# Patient Record
Sex: Male | Born: 1955 | Race: White | Hispanic: No | Marital: Married | Smoking: Never smoker
Health system: Southern US, Community
[De-identification: ages and names within clinical notes are randomized; demographics above are authoritative.]

## PROBLEM LIST (undated history)

## (undated) DIAGNOSIS — G5710 Meralgia paresthetica, unspecified lower limb: Secondary | ICD-10-CM

## (undated) DIAGNOSIS — R7301 Impaired fasting glucose: Secondary | ICD-10-CM

## (undated) DIAGNOSIS — Z95810 Presence of automatic (implantable) cardiac defibrillator: Secondary | ICD-10-CM

## (undated) DIAGNOSIS — T7840XA Allergy, unspecified, initial encounter: Secondary | ICD-10-CM

## (undated) DIAGNOSIS — E785 Hyperlipidemia, unspecified: Secondary | ICD-10-CM

## (undated) DIAGNOSIS — G4730 Sleep apnea, unspecified: Secondary | ICD-10-CM

## (undated) DIAGNOSIS — J45909 Unspecified asthma, uncomplicated: Secondary | ICD-10-CM

## (undated) DIAGNOSIS — I10 Essential (primary) hypertension: Secondary | ICD-10-CM

## (undated) DIAGNOSIS — I509 Heart failure, unspecified: Secondary | ICD-10-CM

## (undated) DIAGNOSIS — E291 Testicular hypofunction: Secondary | ICD-10-CM

## (undated) DIAGNOSIS — R749 Abnormal serum enzyme level, unspecified: Secondary | ICD-10-CM

## (undated) HISTORY — DX: Heart failure, unspecified: I50.9

## (undated) HISTORY — DX: Sleep apnea, unspecified: G47.30

## (undated) HISTORY — DX: Essential (primary) hypertension: I10

## (undated) HISTORY — DX: Hyperlipidemia, unspecified: E78.5

## (undated) HISTORY — DX: Meralgia paresthetica, unspecified lower limb: G57.10

## (undated) HISTORY — PX: NASAL SINUS SURGERY: SHX719

## (undated) HISTORY — DX: Impaired fasting glucose: R73.01

## (undated) HISTORY — DX: Allergy, unspecified, initial encounter: T78.40XA

## (undated) HISTORY — PX: SMALL INTESTINE SURGERY: SHX150

## (undated) HISTORY — DX: Unspecified asthma, uncomplicated: J45.909

## (undated) HISTORY — DX: Testicular hypofunction: E29.1

## (undated) HISTORY — PX: APPENDECTOMY: SHX54

## (undated) HISTORY — DX: Abnormal serum enzyme level, unspecified: R74.9

---

## 2010-10-12 ENCOUNTER — Ambulatory Visit: Payer: Self-pay | Admitting: Gastroenterology

## 2010-10-12 LAB — HM COLONOSCOPY

## 2010-12-12 ENCOUNTER — Ambulatory Visit: Payer: Self-pay

## 2011-10-06 ENCOUNTER — Ambulatory Visit: Payer: Self-pay

## 2013-02-21 ENCOUNTER — Ambulatory Visit: Payer: Self-pay

## 2013-06-08 ENCOUNTER — Emergency Department: Payer: Self-pay | Admitting: Emergency Medicine

## 2013-06-08 LAB — URINALYSIS, COMPLETE
Bilirubin,UR: NEGATIVE
Blood: NEGATIVE
Glucose,UR: NEGATIVE mg/dL (ref 0–75)
Ketone: NEGATIVE
Leukocyte Esterase: NEGATIVE
Nitrite: NEGATIVE
Ph: 7 (ref 4.5–8.0)
WBC UR: <1 /HPF (ref 0–5)

## 2013-06-08 LAB — CBC
HGB: 15.6 g/dL (ref 13.0–18.0)
MCHC: 37 g/dL — ABNORMAL HIGH (ref 32.0–36.0)
MCV: 97 fL (ref 80–100)
RDW: 12.8 % (ref 11.5–14.5)
WBC: 7.4 10*3/uL (ref 3.8–10.6)

## 2013-06-08 LAB — COMPREHENSIVE METABOLIC PANEL
Alkaline Phosphatase: 51 U/L (ref 50–136)
Anion Gap: 5 — ABNORMAL LOW (ref 7–16)
BUN: 16 mg/dL (ref 7–18)
Chloride: 104 mmol/L (ref 98–107)
Creatinine: 1.31 mg/dL — ABNORMAL HIGH (ref 0.60–1.30)
EGFR (African American): >60
EGFR (Non-African Amer.): >60
Glucose: 100 mg/dL — ABNORMAL HIGH (ref 65–99)
Osmolality: 279 (ref 275–301)
SGOT(AST): 35 U/L (ref 15–37)
SGPT (ALT): 44 U/L (ref 12–78)
Sodium: 139 mmol/L (ref 136–145)

## 2013-06-08 LAB — CK TOTAL AND CKMB (NOT AT ARMC): CK-MB: 2.5 ng/mL (ref 0.5–3.6)

## 2014-03-04 DIAGNOSIS — G4733 Obstructive sleep apnea (adult) (pediatric): Secondary | ICD-10-CM | POA: Insufficient documentation

## 2014-03-04 DIAGNOSIS — J45909 Unspecified asthma, uncomplicated: Secondary | ICD-10-CM | POA: Insufficient documentation

## 2014-04-13 LAB — PSA

## 2014-06-15 DIAGNOSIS — J310 Chronic rhinitis: Secondary | ICD-10-CM | POA: Insufficient documentation

## 2014-06-15 DIAGNOSIS — J339 Nasal polyp, unspecified: Secondary | ICD-10-CM | POA: Insufficient documentation

## 2015-01-25 ENCOUNTER — Other Ambulatory Visit: Payer: Self-pay | Admitting: Unknown Physician Specialty

## 2015-04-20 ENCOUNTER — Other Ambulatory Visit: Payer: Self-pay | Admitting: Unknown Physician Specialty

## 2015-04-22 DIAGNOSIS — J709 Respiratory conditions due to unspecified external agent: Secondary | ICD-10-CM | POA: Insufficient documentation

## 2015-05-04 DIAGNOSIS — I110 Hypertensive heart disease with heart failure: Secondary | ICD-10-CM | POA: Insufficient documentation

## 2015-05-04 DIAGNOSIS — E039 Hypothyroidism, unspecified: Secondary | ICD-10-CM

## 2015-05-04 DIAGNOSIS — E291 Testicular hypofunction: Secondary | ICD-10-CM

## 2015-05-04 DIAGNOSIS — E669 Obesity, unspecified: Secondary | ICD-10-CM | POA: Insufficient documentation

## 2015-05-04 DIAGNOSIS — E785 Hyperlipidemia, unspecified: Secondary | ICD-10-CM

## 2015-05-04 DIAGNOSIS — J4489 Other specified chronic obstructive pulmonary disease: Secondary | ICD-10-CM

## 2015-05-04 DIAGNOSIS — R1032 Left lower quadrant pain: Secondary | ICD-10-CM | POA: Insufficient documentation

## 2015-05-04 DIAGNOSIS — J449 Chronic obstructive pulmonary disease, unspecified: Secondary | ICD-10-CM

## 2015-05-04 DIAGNOSIS — G5710 Meralgia paresthetica, unspecified lower limb: Secondary | ICD-10-CM

## 2015-05-04 DIAGNOSIS — R749 Abnormal serum enzyme level, unspecified: Secondary | ICD-10-CM

## 2015-05-04 DIAGNOSIS — R1030 Lower abdominal pain, unspecified: Secondary | ICD-10-CM

## 2015-05-04 DIAGNOSIS — J45909 Unspecified asthma, uncomplicated: Secondary | ICD-10-CM

## 2015-05-04 DIAGNOSIS — I10 Essential (primary) hypertension: Secondary | ICD-10-CM

## 2015-05-04 DIAGNOSIS — R7301 Impaired fasting glucose: Secondary | ICD-10-CM | POA: Insufficient documentation

## 2015-05-05 ENCOUNTER — Encounter: Payer: Self-pay | Admitting: Unknown Physician Specialty

## 2015-05-05 ENCOUNTER — Ambulatory Visit (INDEPENDENT_AMBULATORY_CARE_PROVIDER_SITE_OTHER): Payer: Managed Care, Other (non HMO) | Admitting: Unknown Physician Specialty

## 2015-05-05 VITALS — BP 123/79 | HR 80 | Temp 98.2°F | Ht 69.7 in | Wt 318.2 lb

## 2015-05-05 DIAGNOSIS — I10 Essential (primary) hypertension: Secondary | ICD-10-CM

## 2015-05-05 DIAGNOSIS — E785 Hyperlipidemia, unspecified: Secondary | ICD-10-CM

## 2015-05-05 DIAGNOSIS — R17 Unspecified jaundice: Secondary | ICD-10-CM | POA: Diagnosis not present

## 2015-05-05 LAB — LIPID PANEL PICCOLO, WAIVED
Chol/HDL Ratio Piccolo,Waive: 3 mg/dL
Cholesterol Piccolo, Waived: 195 mg/dL (ref <200)
HDL CHOL PICCOLO, WAIVED: 64 mg/dL (ref >59)
LDL Chol Calc Piccolo Waived: 77 mg/dL (ref <100)
TRIGLYCERIDES PICCOLO,WAIVED: 264 mg/dL — AB (ref <150)
VLDL Chol Calc Piccolo,Waive: 53 mg/dL — ABNORMAL HIGH (ref <30)

## 2015-05-05 LAB — MICROALBUMIN, URINE WAIVED
Creatinine, Urine Waived: 200 mg/dL (ref 10–300)
MICROALB, UR WAIVED: 10 mg/L (ref 0–19)
Microalb/Creat Ratio: <30 mg/g (ref <30)

## 2015-05-05 MED ORDER — VALSARTAN 320 MG PO TABS: 320.0000 mg | ORAL_TABLET | Freq: Every day | ORAL | Status: DC

## 2015-05-05 NOTE — Assessment & Plan Note (Signed)
Mild elevation.  Recheck CMP today

## 2015-05-05 NOTE — Assessment & Plan Note (Signed)
Stable, continue present medications.   

## 2015-05-05 NOTE — Progress Notes (Signed)
BP 123/79 mmHg  Pulse 80  Temp(Src) 98.2 F (36.8 C)  Ht 5' 9.7" (1.77 m)  Wt 318 lb 3.2 oz (144.335 kg)  BMI 46.07 kg/m2  SpO2 91%   Subjective:    Patient ID: Thomas Calhoun, male    DOB: 1956-02-03, 59 y.o.   MRN: [id number]  HPI: Thomas Calhoun is a 59 y.o. male  Chief Complaint  Patient presents with  . Hyperlipidemia  . Hypertension  . Hypothyroidism   Pt was just in the hospital for mold issues.  His 6 month f/u is due now but came in for a total Bilirubin of 1.6.  His other liver enzymes are normal.  He does not drink.  He got labs done for the mold issues but I'm not sure what he got.   Relevant past medical, surgical, family and social history reviewed and updated as indicated. Interim medical history since our last visit reviewed.  I reviewed care everywhere and labs relevant to his chronic diagnosis were not drawn.    Hyperlipidemia This is a chronic problem. The problem is controlled. Associated symptoms include shortness of breath. Pertinent negatives include no chest pain, focal sensory loss, focal weakness, leg pain or myalgias. (Seeing pumonary) There are no compliance problems.   Hypertension This is a chronic problem. Associated symptoms include shortness of breath. Pertinent negatives include no chest pain, malaise/fatigue, neck pain or palpitations. There are no compliance problems.     Allergies and medications reviewed and updated.  Review of Systems  Constitutional: Negative for malaise/fatigue.  Respiratory: Positive for shortness of breath.        Severe asthma per Dr. Raul Del  Cardiovascular: Negative for chest pain and palpitations.  Musculoskeletal: Negative for myalgias and neck pain.  Neurological: Negative for focal weakness.    Per HPI unless specifically indicated above     Objective:    BP 123/79 mmHg  Pulse 80  Temp(Src) 98.2 F (36.8 C)  Ht 5' 9.7" (1.77 m)  Wt 318 lb 3.2 oz (144.335 kg)  BMI 46.07 kg/m2  SpO2 91%  Wt  Readings from Last 3 Encounters:  05/05/15 318 lb 3.2 oz (144.335 kg)  11/03/14 317 lb (143.79 kg)    Physical Exam  Constitutional: He is oriented to person, place, and time. He appears well-developed and well-nourished. No distress.  HENT:  Head: Normocephalic and atraumatic.  Eyes: Conjunctivae and lids are normal. Right eye exhibits no discharge. Left eye exhibits no discharge. No scleral icterus.  Cardiovascular: Normal rate, regular rhythm and normal heart sounds.   Pulmonary/Chest: He has wheezes.  Abdominal: Normal appearance and bowel sounds are normal. He exhibits no distension. There is no splenomegaly or hepatomegaly. There is no tenderness.  Musculoskeletal: Normal range of motion.  Neurological: He is alert and oriented to person, place, and time.  Skin: Skin is intact. No rash noted. No pallor.  Psychiatric: He has a normal mood and affect. His behavior is normal. Judgment and thought content normal.       Assessment & Plan:   Problem List Items Addressed This Visit      Unprioritized   Hypertension - Primary    Stable, continue present medications.       Hyperlipidemia    Stable, continue present medications.       Total bilirubin, elevated    Mild elevation.  Recheck CMP today          Follow up plan: Return in about 6 months (around 11/02/2015)  for physical.

## 2015-05-06 ENCOUNTER — Encounter: Payer: Self-pay | Admitting: Unknown Physician Specialty

## 2015-05-06 LAB — COMPREHENSIVE METABOLIC PANEL
A/G RATIO: 2 (ref 1.1–2.5)
ALT: 23 IU/L (ref 0–44)
AST: 21 IU/L (ref 0–40)
Albumin: 4.3 g/dL (ref 3.5–5.5)
Alkaline Phosphatase: 53 IU/L (ref 39–117)
BILIRUBIN TOTAL: 1.1 mg/dL (ref 0.0–1.2)
BUN/Creatinine Ratio: 11 (ref 9–20)
BUN: 13 mg/dL (ref 6–24)
CHLORIDE: 100 mmol/L (ref 97–108)
CO2: 27 mmol/L (ref 18–29)
Calcium: 9.4 mg/dL (ref 8.7–10.2)
Creatinine, Ser: 1.2 mg/dL (ref 0.76–1.27)
GFR calc Af Amer: 77 mL/min/{1.73_m2} (ref >59)
GFR calc non Af Amer: 66 mL/min/{1.73_m2} (ref >59)
GLUCOSE: 88 mg/dL (ref 65–99)
Globulin, Total: 2.2 g/dL (ref 1.5–4.5)
POTASSIUM: 3.7 mmol/L (ref 3.5–5.2)
Sodium: 144 mmol/L (ref 134–144)
Total Protein: 6.5 g/dL (ref 6.0–8.5)

## 2015-05-06 LAB — URIC ACID: URIC ACID: 6.2 mg/dL (ref 3.7–8.6)

## 2015-05-12 ENCOUNTER — Telehealth: Payer: Self-pay | Admitting: Unknown Physician Specialty

## 2015-05-12 NOTE — Telephone Encounter (Signed)
Pt called would like his lab results from last week. Please call him ASAP. Thanks.

## 2015-05-12 NOTE — Telephone Encounter (Signed)
Called and let patient know that lab results were sent out in the mail and he should be getting them soon. I told him to give [place] a call if he has any questions or concerns when he looks over his results.

## 2015-06-16 ENCOUNTER — Other Ambulatory Visit: Payer: Self-pay

## 2015-06-16 MED ORDER — FLUTICASONE PROPIONATE 50 MCG/ACT NA SUSP: 2.0000 | Freq: Every day | NASAL | Status: DC

## 2015-06-16 NOTE — Telephone Encounter (Signed)
LAST VISIT: 05/05/2015 UPCOMING APT: 10/29/2014  Request for fluticasone 79mcg nasal spray.

## 2015-07-19 ENCOUNTER — Other Ambulatory Visit: Payer: Self-pay | Admitting: Unknown Physician Specialty

## 2015-07-19 MED ORDER — VALSARTAN 320 MG PO TABS: 320.0000 mg | ORAL_TABLET | Freq: Every day | ORAL | Status: DC

## 2015-07-19 NOTE — Telephone Encounter (Signed)
Patient was seen 05/05/15 and has an appointment scheduled for 11/02/15.

## 2015-07-19 NOTE — Telephone Encounter (Signed)
Pt needs check further refills 

## 2015-08-12 ENCOUNTER — Other Ambulatory Visit: Payer: Self-pay

## 2015-08-12 MED ORDER — FLUTICASONE PROPIONATE 50 MCG/ACT NA SUSP: 2.0000 | Freq: Every day | NASAL | Status: DC

## 2015-08-12 NOTE — Telephone Encounter (Signed)
DOB: March 02, 1956 Last Visit: 05/12/2015 Next Appointment: 11/02/2015 [facility] Rx #: [id number]  Request for Fluticasone 60mcg/act # 16.

## 2015-11-02 ENCOUNTER — Encounter: Payer: Self-pay | Admitting: Unknown Physician Specialty

## 2015-11-02 ENCOUNTER — Ambulatory Visit (INDEPENDENT_AMBULATORY_CARE_PROVIDER_SITE_OTHER): Payer: Managed Care, Other (non HMO) | Admitting: Unknown Physician Specialty

## 2015-11-02 VITALS — BP 146/81 | HR 80 | Temp 98.4°F | Ht 68.2 in | Wt 329.0 lb

## 2015-11-02 DIAGNOSIS — I10 Essential (primary) hypertension: Secondary | ICD-10-CM

## 2015-11-02 DIAGNOSIS — E785 Hyperlipidemia, unspecified: Secondary | ICD-10-CM

## 2015-11-02 DIAGNOSIS — R7301 Impaired fasting glucose: Secondary | ICD-10-CM

## 2015-11-02 MED ORDER — VALSARTAN 320 MG PO TABS: 320.0000 mg | ORAL_TABLET | Freq: Every day | ORAL | Status: DC

## 2015-11-02 NOTE — Assessment & Plan Note (Addendum)
Pt working to control HLD through diet and exercise. Collected Lipid panel (sent out d/t [facility] closed). Pt will follow-up in 6 months.

## 2015-11-02 NOTE — Assessment & Plan Note (Addendum)
Pt taking medication without difficulty. In clinic BP 146/81. Collected CMP, Uric Acid, and Microalbumin labs (sent out d/t [facility] closed). Pt given refill for Valsartan and will follow-up in 6 months.

## 2015-11-02 NOTE — Progress Notes (Signed)
BP 146/81 mmHg  Pulse 80  Temp(Src) 98.4 F (36.9 C)  Ht 5' 8.2" (1.732 m)  Wt 329 lb (149.233 kg)  BMI 49.75 kg/m2  SpO2 92%   Subjective:    Patient ID: Thomas Calhoun, male    DOB: Mar 14, 1956, 60 y.o.   MRN: [id number]  HPI: Thomas Calhoun is a 60 y.o. male  Chief Complaint  Patient presents with  . Hyperlipidemia  . Hypertension    Pt c/o congestion and URI. Pt was seen by pulmonologist and given a prednisone taper and z-pack. Pt reports improvement in symptoms. Pt is taking medication as prescribed without difficulty. Denies episodes of CP, SOB, dizziness, or weakness.   Hypertension Using medications without difficulty Average home BP's: pt does not check BP at home.   No problems or lightheadedness No chest pain with exertion  Exertional shortness of breath: pt reports increased SOB over the last few weeks d/t URI. Pt states that on a normal basis he does not have SOB.  Edema: pt reports peripheral edema in bilateral ankles. Pt thinks it may be related to his prednisone use, on/off, for his respiratory disease.   Hyperlipidemia Diet compliance: pt reports reduced intake in sugar, diet drinks, and increased water intake Exercise: pt reports exercising, reduced over the last few weeks d/t his breathing difficulty and respiratory illness. Prior to recent illness, pt was going to the local gym everyday for at least 1 hour of physical activity.   Relevant past medical, surgical, family and social history reviewed and updated as indicated. Interim medical history since our last visit reviewed. Allergies and medications reviewed and updated.  Review of Systems /81 Constitutional: Positive for activity change (recent change d/t increased SOB related to URI). Negative for fever, chills, appetite change, fatigue and unexpected weight change.  HENT: Negative.   Eyes: Negative.   Respiratory: Positive for cough, shortness of breath and wheezing. Negative for chest  tightness.   Cardiovascular: Positive for leg swelling. Negative for chest pain and palpitations.  Gastrointestinal: Negative.   Genitourinary: Negative.   Musculoskeletal: Negative.   Skin: Negative.   Neurological: Negative.  Negative for syncope, weakness and light-headedness.  Psychiatric/Behavioral: Negative.     Per HPI unless specifically indicated above     Objective:    BP 146/81 mmHg  Pulse 80  Temp(Src) 98.4 F (36.9 C)  Ht 5' 8.2" (1.732 m)  Wt 329 lb (149.233 kg)  BMI 49.75 kg/m2  SpO2 92%  Wt Readings from Last 3 Encounters:  11/02/15 329 lb (149.233 kg)  05/05/15 318 lb 3.2 oz (144.335 kg)  11/03/14 317 lb (143.79 kg)    Physical Exam  Constitutional: He is oriented to person, place, and time. He appears well-developed and well-nourished. No distress.  HENT:  Head: Normocephalic and atraumatic.  Eyes: Conjunctivae and EOM are normal. Pupils are equal, round, and reactive to light.  Neck: Normal range of motion. Neck supple. No JVD present. Carotid bruit is not present.  Cardiovascular: Normal rate, regular rhythm, normal heart sounds and normal pulses.   Pulmonary/Chest: Effort normal. No accessory muscle usage. No respiratory distress. He has wheezes. He exhibits no tenderness.  Abdominal: Soft. Bowel sounds are normal. There is no tenderness.  Musculoskeletal: Normal range of motion. He exhibits edema (noted bilateral lower extremity edema ). He exhibits no tenderness.  Neurological: He is alert and oriented to person, place, and time.  Skin: Skin is warm and dry.  Psychiatric: He has a normal mood and  affect. His behavior is normal. Judgment and thought content normal.  Nursing note and vitals reviewed.   Results for orders placed or performed in visit on 05/05/15  Lipid Panel Piccolo, [employer]  Result Value Ref Range   Cholesterol Piccolo, Waived 195 <200 mg/dL   HDL Chol Piccolo, Waived 64 >59 mg/dL   Triglycerides Piccolo,Waived 264 (H) <150 mg/dL    Chol/HDL Ratio Piccolo,Waive 3.0 mg/dL   LDL Chol Calc Piccolo Waived 77 <100 mg/dL   VLDL Chol Calc Piccolo,Waive 53 (H) <30 mg/dL  Uric acid  Result Value Ref Range   Uric Acid 6.2 3.7 - 8.6 mg/dL  Microalbumin, Urine Waived  Result Value Ref Range   Microalb, Ur Waived 10 0 - 19 mg/L   Creatinine, Urine Waived 200 10 - 300 mg/dL   Microalb/Creat Ratio <30 <30 mg/g  Comprehensive metabolic panel  Result Value Ref Range   Glucose 88 65 - 99 mg/dL   BUN 13 6 - 24 mg/dL   Creatinine, Ser 1.20 0.76 - 1.27 mg/dL   GFR calc non Af Amer 66 >59 mL/min/1.73   GFR calc Af Amer 77 >59 mL/min/1.73   BUN/Creatinine Ratio 11 9 - 20   Sodium 144 134 - 144 mmol/L   Potassium 3.7 3.5 - 5.2 mmol/L   Chloride 100 97 - 108 mmol/L   CO2 27 18 - 29 mmol/L   Calcium 9.4 8.7 - 10.2 mg/dL   Total Protein 6.5 6.0 - 8.5 g/dL   Albumin 4.3 3.5 - 5.5 g/dL   Globulin, Total 2.2 1.5 - 4.5 g/dL   Albumin/Globulin Ratio 2.0 1.1 - 2.5   Bilirubin Total 1.1 0.0 - 1.2 mg/dL   Alkaline Phosphatase 53 39 - 117 IU/L   AST 21 0 - 40 IU/L   ALT 23 0 - 44 IU/L      Assessment & Plan:   Problem List Items Addressed This Visit      Unprioritized   Hypertension - Primary    Pt taking medication without difficulty. In clinic BP 146/81. Collected CMP, Uric Acid, and Microalbumin labs (sent out d/t [facility] closed). Pt given refill for Valsartan and will follow-up in 6 months.       Relevant Medications   valsartan (DIOVAN) 320 MG tablet   Other Relevant Orders   Comprehensive metabolic panel   Microalbumin / creatinine urine ratio   Uric acid   Hyperlipidemia    Pt working to control HLD through diet and exercise. Collected Lipid panel (sent out d/t [facility] closed). Pt will follow-up in 6 months.       Relevant Medications   valsartan (DIOVAN) 320 MG tablet   Other Relevant Orders   Lipid Panel w/o Chol/HDL Ratio   IFG (impaired fasting glucose)    Pt present with chronic history of obesity with an  increase in weight and frequent use of steroids. Collected HgA1C (sent out d/t [facility] closed). Pt will follow-up in 6 months.       Relevant Orders   Hgb A1c w/o eAG       Follow up plan: Return in about 6 months (around 05/04/2016).

## 2015-11-02 NOTE — Assessment & Plan Note (Addendum)
Pt present with chronic history of obesity with an increase in weight and frequent use of steroids. Collected HgA1C (sent out d/t [facility] closed). Pt will follow-up in 6 months.

## 2015-11-03 LAB — MICROALBUMIN / CREATININE URINE RATIO
Creatinine, Urine: 126.8 mg/dL
MICROALB/CREAT RATIO: 2.8 mg/g creat (ref 0.0–30.0)
Microalbumin, Urine: 3.5 ug/mL

## 2015-11-03 LAB — COMPREHENSIVE METABOLIC PANEL
ALBUMIN: 4.3 g/dL (ref 3.5–5.5)
ALT: 28 IU/L (ref 0–44)
AST: 30 IU/L (ref 0–40)
Albumin/Globulin Ratio: 1.9 (ref 1.2–2.2)
Alkaline Phosphatase: 51 IU/L (ref 39–117)
BILIRUBIN TOTAL: 1.4 mg/dL — AB (ref 0.0–1.2)
BUN / CREAT RATIO: 11 (ref 9–20)
BUN: 13 mg/dL (ref 6–24)
CO2: 28 mmol/L (ref 18–29)
CREATININE: 1.2 mg/dL (ref 0.76–1.27)
Calcium: 9.5 mg/dL (ref 8.7–10.2)
Chloride: 101 mmol/L (ref 96–106)
GFR calc non Af Amer: 66 mL/min/{1.73_m2} (ref >59)
GFR, EST AFRICAN AMERICAN: 77 mL/min/{1.73_m2} (ref >59)
GLOBULIN, TOTAL: 2.3 g/dL (ref 1.5–4.5)
GLUCOSE: 103 mg/dL — AB (ref 65–99)
Potassium: 3.9 mmol/L (ref 3.5–5.2)
SODIUM: 145 mmol/L — AB (ref 134–144)
TOTAL PROTEIN: 6.6 g/dL (ref 6.0–8.5)

## 2015-11-03 LAB — LIPID PANEL W/O CHOL/HDL RATIO
Cholesterol, Total: 201 mg/dL — ABNORMAL HIGH (ref 100–199)
HDL: 63 mg/dL (ref >39)
LDL Calculated: 105 mg/dL — ABNORMAL HIGH (ref 0–99)
TRIGLYCERIDES: 163 mg/dL — AB (ref 0–149)
VLDL Cholesterol Cal: 33 mg/dL (ref 5–40)

## 2015-11-03 LAB — HGB A1C W/O EAG: HEMOGLOBIN A1C: 5.7 % — AB (ref 4.8–5.6)

## 2015-11-03 LAB — URIC ACID: URIC ACID: 6.8 mg/dL (ref 3.7–8.6)

## 2015-11-09 ENCOUNTER — Encounter: Payer: Self-pay | Admitting: Unknown Physician Specialty

## 2015-12-02 DIAGNOSIS — R05 Cough: Secondary | ICD-10-CM | POA: Insufficient documentation

## 2015-12-02 DIAGNOSIS — R059 Cough, unspecified: Secondary | ICD-10-CM | POA: Insufficient documentation

## 2015-12-02 DIAGNOSIS — J324 Chronic pansinusitis: Secondary | ICD-10-CM | POA: Insufficient documentation

## 2016-04-03 DIAGNOSIS — H6693 Otitis media, unspecified, bilateral: Secondary | ICD-10-CM | POA: Insufficient documentation

## 2016-05-09 ENCOUNTER — Ambulatory Visit (INDEPENDENT_AMBULATORY_CARE_PROVIDER_SITE_OTHER): Payer: Managed Care, Other (non HMO) | Admitting: Unknown Physician Specialty

## 2016-05-09 ENCOUNTER — Encounter: Payer: Self-pay | Admitting: Unknown Physician Specialty

## 2016-05-09 VITALS — BP 127/78 | HR 90 | Temp 98.1°F | Ht 69.9 in | Wt 335.2 lb

## 2016-05-09 DIAGNOSIS — Z0000 Encounter for general adult medical examination without abnormal findings: Secondary | ICD-10-CM

## 2016-05-09 DIAGNOSIS — I10 Essential (primary) hypertension: Secondary | ICD-10-CM | POA: Diagnosis not present

## 2016-05-09 MED ORDER — VALSARTAN 320 MG PO TABS: 320.0000 mg | ORAL_TABLET | Freq: Every day | ORAL | 1 refills | Status: DC

## 2016-05-09 NOTE — Progress Notes (Signed)
BP 127/78 (BP Location: Left Arm, Patient Position: Sitting, Cuff Size: Large)   Pulse 90   Temp 98.1 F (36.7 C)   Ht 5' 9.9" (1.775 m)   Wt (!) 335 lb 3.2 oz (152 kg)   SpO2 90%   BMI 48.23 kg/m    Subjective:    Patient ID: Thomas Calhoun, male    DOB: 12-22-1955, 60 y.o.   MRN: [id number]  HPI: Thomas Calhoun is a 60 y.o. male  Chief Complaint  Patient presents with  . Hypertension  . Asthma   Hypertension Using medications without difficulty Average home BPs Not checking   No problems or lightheadedness No chest pain with exertion or shortness of breath No Edema  Has seen pulmonary and also to Orthopedics due to knee pain.      Relevant past medical, surgical, family and social history reviewed and updated as indicated. Interim medical history since our last visit reviewed. Allergies and medications reviewed and updated.  Review of Systems  Per HPI unless specifically indicated above     Objective:    BP 127/78 (BP Location: Left Arm, Patient Position: Sitting, Cuff Size: Large)   Pulse 90   Temp 98.1 F (36.7 C)   Ht 5' 9.9" (1.775 m)   Wt (!) 335 lb 3.2 oz (152 kg)   SpO2 90%   BMI 48.23 kg/m   Wt Readings from Last 3 Encounters:  05/09/16 (!) 335 lb 3.2 oz (152 kg)  11/02/15 (!) 329 lb (149.2 kg)  05/05/15 (!) 318 lb 3.2 oz (144.3 kg)    Physical Exam  Constitutional: He is oriented to person, place, and time. He appears well-developed and well-nourished. No distress.  HENT:  Head: Normocephalic and atraumatic.  Eyes: Conjunctivae and lids are normal. Right eye exhibits no discharge. Left eye exhibits no discharge. No scleral icterus.  Neck: Normal range of motion. Neck supple. No JVD present. Carotid bruit is not present.  Cardiovascular: Normal rate, regular rhythm and normal heart sounds.   Pulmonary/Chest: Effort normal. No respiratory distress. He has wheezes.  Wheezes throughout typical for patient  Abdominal: Normal appearance.  There is no splenomegaly or hepatomegaly.  Musculoskeletal: Normal range of motion.  Neurological: He is alert and oriented to person, place, and time.  Skin: Skin is warm, dry and intact. No rash noted. No pallor.  Psychiatric: He has a normal mood and affect. His behavior is normal. Judgment and thought content normal.    Results for orders placed or performed in visit on 11/02/15  Lipid Panel w/o Chol/HDL Ratio  Result Value Ref Range   Cholesterol, Total 201 (H) 100 - 199 mg/dL   Triglycerides 163 (H) 0 - 149 mg/dL   HDL 63 >39 mg/dL   VLDL Cholesterol Cal 33 5 - 40 mg/dL   LDL Calculated 105 (H) 0 - 99 mg/dL  Comprehensive metabolic panel  Result Value Ref Range   Glucose 103 (H) 65 - 99 mg/dL   BUN 13 6 - 24 mg/dL   Creatinine, Ser 1.20 0.76 - 1.27 mg/dL   GFR calc non Af Amer 66 >59 mL/min/1.73   GFR calc Af Amer 77 >59 mL/min/1.73   BUN/Creatinine Ratio 11 9 - 20   Sodium 145 (H) 134 - 144 mmol/L   Potassium 3.9 3.5 - 5.2 mmol/L   Chloride 101 96 - 106 mmol/L   CO2 28 18 - 29 mmol/L   Calcium 9.5 8.7 - 10.2 mg/dL   Total Protein 6.6  6.0 - 8.5 g/dL   Albumin 4.3 3.5 - 5.5 g/dL   Globulin, Total 2.3 1.5 - 4.5 g/dL   Albumin/Globulin Ratio 1.9 1.2 - 2.2   Bilirubin Total 1.4 (H) 0.0 - 1.2 mg/dL   Alkaline Phosphatase 51 39 - 117 IU/L   AST 30 0 - 40 IU/L   ALT 28 0 - 44 IU/L  Microalbumin / creatinine urine ratio  Result Value Ref Range   Creatinine, Urine 126.8 Not Estab. mg/dL   Microalbum.,U,Random 3.5 Not Estab. ug/mL   MICROALB/CREAT RATIO 2.8 0.0 - 30.0 mg/g creat  Uric acid  Result Value Ref Range   Uric Acid 6.8 3.7 - 8.6 mg/dL  Hgb A1c w/o eAG  Result Value Ref Range   Hgb A1c MFr Bld 5.7 (H) 4.8 - 5.6 %      Assessment & Plan:   Problem List Items Addressed This Visit      Unprioritized   Hypertension    Stable, continue present medications.        Relevant Medications   valsartan (DIOVAN) 320 MG tablet    Other Visit Diagnoses    Routine  general medical examination at a health care facility    -  Primary   Relevant Orders   Ambulatory referral to Gastroenterology       Follow up plan: Return in about 6 months (around 11/06/2016) for physcial.

## 2016-05-09 NOTE — Assessment & Plan Note (Signed)
Stable, continue present medications.   

## 2016-05-10 ENCOUNTER — Telehealth: Payer: Self-pay

## 2016-05-10 ENCOUNTER — Other Ambulatory Visit: Payer: Self-pay

## 2016-05-10 NOTE — Telephone Encounter (Signed)
Gastroenterology Pre-Procedure Review  Request Date: 07/18/2016 Requesting Physician: Dr. Julian Hy  PATIENT REVIEW QUESTIONS: The patient responded to the following health history questions as indicated:    1. Are you having any GI issues? no 2. Do you have a personal history of Polyps? no 3. Do you have a family history of Colon Cancer or Polyps? no 4. Diabetes Mellitus? no 5. Joint replacements in the past 12 months?no 6. Major health problems in the past 3 months?no 7. Any artificial heart valves, MVP, or defibrillator?no    MEDICATIONS & ALLERGIES:    Patient reports the following regarding taking any anticoagulation/antiplatelet therapy:   Plavix, Coumadin, Eliquis, Xarelto, Lovenox, Pradaxa, Brilinta, or Effient? no Aspirin? no  Patient confirms/reports the following medications:  Current Outpatient Prescriptions  Medication Sig Dispense Refill  . albuterol (PROVENTIL HFA;VENTOLIN HFA) 108 (90 BASE) MCG/ACT inhaler Inhale 2 puffs into the lungs every 6 (six) hours as needed for wheezing or shortness of breath.    Marland Kitchen albuterol (PROVENTIL) (2.5 MG/3ML) 0.083% nebulizer solution     . budesonide (PULMICORT) 0.5 MG/2ML nebulizer solution Add 1 vial to 23ml of saline in rinse bottle. Irrigate sinuses with 147ml through each nostril twice daily    . fluticasone (FLONASE) 50 MCG/ACT nasal spray Place 2 sprays into both nostrils daily. 16 g 12  . Fluticasone-Salmeterol (ADVAIR DISKUS) 500-50 MCG/DOSE AEPB Inhale 1 puff into the lungs 2 (two) times daily.    . montelukast (SINGULAIR) 10 MG tablet Take 10 mg by mouth.     . SPIRIVA HANDIHALER 18 MCG inhalation capsule Place into inhaler and inhale daily.    . theophylline (THEODUR) 200 MG 12 hr tablet Take 200 mg by mouth 2 (two) times daily.    . valsartan (DIOVAN) 320 MG tablet Take 1 tablet (320 mg total) by mouth daily. 90 tablet 1  . vitamin B-12 (CYANOCOBALAMIN) 250 MCG tablet Take 250 mcg by mouth daily.    . vitamin C (ASCORBIC  ACID) 500 MG tablet Take 500 mg by mouth daily.    . vitamin E (VITAMIN E) 400 UNIT capsule Take 400 Units by mouth daily.     No current facility-administered medications for this visit.     Patient confirms/reports the following allergies:  Allergies  Allergen Reactions  . Amlodipine Besylate Swelling  . Avelox [Moxifloxacin Hcl In Nacl]   . Penicillin V Potassium Rash    No orders of the defined types were placed in this encounter.   AUTHORIZATION INFORMATION Primary Insurance: 1D#: Group #:  Secondary Insurance: 1D#: Group #:  SCHEDULE INFORMATION: Date: 07/18/2016 Time: Location: [facility]

## 2016-05-10 NOTE — Telephone Encounter (Signed)
Screening Colonoscopy Z12.11 [facility] [id number] [employer] Pre cert is not required

## 2016-07-11 ENCOUNTER — Other Ambulatory Visit: Payer: Self-pay

## 2016-07-11 MED ORDER — FLUTICASONE PROPIONATE 50 MCG/ACT NA SUSP: 2.0000 | Freq: Every day | NASAL | 12 refills | Status: DC

## 2016-07-18 ENCOUNTER — Encounter: Payer: Self-pay | Admitting: Certified Registered Nurse Anesthetist

## 2016-07-18 ENCOUNTER — Ambulatory Visit
Admission: RE | Admit: 2016-07-18 | Discharge: 2016-07-18 | Disposition: A | Discharge status: Home / Self Care | Payer: Managed Care, Other (non HMO) | Source: Ambulatory Visit | Attending: Gastroenterology | Admitting: Gastroenterology

## 2016-07-18 ENCOUNTER — Encounter
Admission: RE | Disposition: A | Discharge status: Home / Self Care | Payer: Self-pay | Source: Ambulatory Visit | Attending: Gastroenterology

## 2016-07-18 ENCOUNTER — Ambulatory Visit: Payer: Managed Care, Other (non HMO) | Admitting: Anesthesiology

## 2016-07-18 DIAGNOSIS — Z79899 Other long term (current) drug therapy: Secondary | ICD-10-CM | POA: Insufficient documentation

## 2016-07-18 DIAGNOSIS — K635 Polyp of colon: Secondary | ICD-10-CM

## 2016-07-18 DIAGNOSIS — G4730 Sleep apnea, unspecified: Secondary | ICD-10-CM | POA: Insufficient documentation

## 2016-07-18 DIAGNOSIS — J449 Chronic obstructive pulmonary disease, unspecified: Secondary | ICD-10-CM | POA: Diagnosis not present

## 2016-07-18 DIAGNOSIS — Z1211 Encounter for screening for malignant neoplasm of colon: Secondary | ICD-10-CM | POA: Diagnosis not present

## 2016-07-18 DIAGNOSIS — I10 Essential (primary) hypertension: Secondary | ICD-10-CM | POA: Diagnosis not present

## 2016-07-18 DIAGNOSIS — D123 Benign neoplasm of transverse colon: Secondary | ICD-10-CM | POA: Diagnosis not present

## 2016-07-18 DIAGNOSIS — E785 Hyperlipidemia, unspecified: Secondary | ICD-10-CM | POA: Diagnosis not present

## 2016-07-18 DIAGNOSIS — K641 Second degree hemorrhoids: Secondary | ICD-10-CM | POA: Insufficient documentation

## 2016-07-18 DIAGNOSIS — D120 Benign neoplasm of cecum: Secondary | ICD-10-CM | POA: Diagnosis not present

## 2016-07-18 DIAGNOSIS — D125 Benign neoplasm of sigmoid colon: Secondary | ICD-10-CM | POA: Diagnosis not present

## 2016-07-18 HISTORY — PX: COLONOSCOPY WITH PROPOFOL: SHX5780

## 2016-07-18 SURGERY — COLONOSCOPY WITH PROPOFOL
Anesthesia: General

## 2016-07-18 MED ORDER — PROPOFOL 10 MG/ML IV BOLUS
INTRAVENOUS | Status: DC | PRN
  Administered 2016-07-18: 30 mg via INTRAVENOUS
  Administered 2016-07-18 (×3): 10 mg via INTRAVENOUS

## 2016-07-18 MED ORDER — MIDAZOLAM HCL 2 MG/2ML IJ SOLN
INTRAMUSCULAR | Status: DC | PRN
  Administered 2016-07-18: 1 mg via INTRAVENOUS

## 2016-07-18 MED ORDER — PROPOFOL 500 MG/50ML IV EMUL
INTRAVENOUS | Status: DC | PRN
  Administered 2016-07-18: 100 ug/kg/min via INTRAVENOUS

## 2016-07-18 MED ORDER — SODIUM CHLORIDE 0.9 % IV SOLN
INTRAVENOUS | Status: DC
Start: 2016-07-18 — End: 2016-07-18
  Administered 2016-07-18: 1000 mL via INTRAVENOUS

## 2016-07-18 MED ORDER — LIDOCAINE HCL (CARDIAC) 10 MG/ML IV SOLN
INTRAVENOUS | Status: DC | PRN
  Administered 2016-07-18: 25 mg via INTRAVENOUS

## 2016-07-18 NOTE — Anesthesia Postprocedure Evaluation (Signed)
Anesthesia Post Note  Patient: Thomas Calhoun  Procedure(s) Performed: Procedure(s) (LRB): COLONOSCOPY WITH PROPOFOL (N/A)  Patient location during evaluation: Endoscopy Anesthesia Type: General Level of consciousness: awake and alert and oriented Pain management: pain level controlled Vital Signs Assessment: post-procedure vital signs reviewed and stable Respiratory status: spontaneous breathing, nonlabored ventilation and respiratory function stable Cardiovascular status: blood pressure returned to baseline and stable Postop Assessment: no signs of nausea or vomiting Anesthetic complications: no    Last Vitals:  Vitals:   07/18/16 0734 07/18/16 0826  BP: 139/75 106/67  Pulse: 85 80  Resp: (!) 22 (!) 22  Temp: 37.1 C 36.5 C    Last Pain:  Vitals:   07/18/16 0826  TempSrc: Tympanic                 Nakeshia Waldeck

## 2016-07-18 NOTE — Op Note (Signed)
[facility] Patient Name: Thomas Calhoun Procedure Date: 07/18/2016 7:53 AM MRN: [id number] Account #: [id number] Date of Birth: 05-26-1956 Admit Type: Outpatient Age: 60 Room: [facility] ENDO ROOM 4 Gender: Male Note Status: Finalized Procedure:            Colonoscopy Indications:          Screening for colorectal malignant neoplasm Providers:            [name] MD, MD Referring MD:         Kathrine Haddock (Referring MD) Medicines:            Propofol per Anesthesia Complications:        No immediate complications. Procedure:            Pre-Anesthesia Assessment:                       - Prior to the procedure, a History and Physical was                        performed, and patient medications and allergies were                        reviewed. The patient's tolerance of previous                        anesthesia was also reviewed. The risks and benefits of                        the procedure and the sedation options and risks were                        discussed with the patient. All questions were                        answered, and informed consent was obtained. Prior                        Anticoagulants: The patient has taken no previous                        anticoagulant or antiplatelet agents. ASA Grade                        Assessment: II - A patient with mild systemic disease.                        After reviewing the risks and benefits, the patient was                        deemed in satisfactory condition to undergo the                        procedure.                       After obtaining informed consent, the colonoscope was                        passed under direct vision. Throughout the procedure,  the patient's blood pressure, pulse, and oxygen                        saturations were monitored continuously. The                        Colonoscope was introduced through the anus and     advanced to the the cecum, identified by appendiceal                        orifice and ileocecal valve. The colonoscopy was                        performed without difficulty. The patient tolerated the                        procedure well. The quality of the bowel preparation                        was good. Findings:      The perianal and digital rectal examinations were normal.      A 3 mm polyp was found in the transverse colon. The polyp was sessile.       The polyp was removed with a cold biopsy forceps. Resection and       retrieval were complete.      A 6 mm polyp was found in the cecum. The polyp was sessile. The polyp       was removed with a cold snare. Resection and retrieval were complete.      Two sessile polyps were found in the sigmoid colon. The polyps were 4 to       5 mm in size. These polyps were removed with a cold snare. Resection and       retrieval were complete.      Non-bleeding internal hemorrhoids were found during retroflexion. The       hemorrhoids were Grade II (internal hemorrhoids that prolapse but reduce       spontaneously). Impression:           - One 3 mm polyp in the transverse colon, removed with                        a cold biopsy forceps. Resected and retrieved.                       - One 6 mm polyp in the cecum, removed with a cold                        snare. Resected and retrieved.                       - Two 4 to 5 mm polyps in the sigmoid colon, removed                        with a cold snare. Resected and retrieved.                       - Non-bleeding internal hemorrhoids. Recommendation:       - Discharge patient to home.                       -  Resume previous diet.                       - Continue present medications.                       - Await pathology results.                       - Repeat colonoscopy in 5 years if polyp adenoma and 10                        years if hyperplastic Procedure Code(s):    --- Professional  ---                       [phone], Colonoscopy, flexible; with removal of tumor(s),                        polyp(s), or other lesion(s) by snare technique                       [id number], 75, Colonoscopy, flexible; with biopsy, single                        or multiple Diagnosis Code(s):    --- Professional ---                       Z12.11, Encounter for screening for malignant neoplasm                        of colon                       D12.0, Benign neoplasm of cecum                       D12.3, Benign neoplasm of transverse colon (hepatic                        flexure or splenic flexure)                       D12.5, Benign neoplasm of sigmoid colon CPT copyright 2016 [employer]. All rights reserved. The codes documented in this report are preliminary and upon coder review may  be revised to meet current compliance requirements. [name] MD, MD 07/18/2016 8:22:34 AM This report has been signed electronically. Number of Addenda: 0 Note Initiated On: 07/18/2016 7:53 AM Scope Withdrawal Time: 0 hours 8 minutes 55 seconds  Total Procedure Duration: 0 hours 15 minutes 1 second       [facility]

## 2016-07-18 NOTE — H&P (Signed)
[name], MD [facility] [address] [place], [place] [address] Phone: [phone] Fax : [phone]  Primary Care Physician:  Kathrine Haddock, NP Primary Gastroenterologist:  Dr. [name]  Pre-Procedure History & Physical: HPI:  Thomas Calhoun is a 60 y.o. male is here for a screening colonoscopy.   Past Medical History:  Diagnosis Date  . Abnormal serum enzyme level   . Asthma   . Hyperlipidemia   . Hypertension   . IFG (impaired fasting glucose)   . Meralgia paresthetica   . Testicular hypofunction     Past Surgical History:  Procedure Laterality Date  . APPENDECTOMY    . NASAL SINUS SURGERY    . SMALL INTESTINE SURGERY      Prior to Admission medications   Medication Sig Start Date End Date Taking? Authorizing Provider  albuterol (PROVENTIL HFA;VENTOLIN HFA) 108 (90 BASE) MCG/ACT inhaler Inhale 2 puffs into the lungs every 6 (six) hours as needed for wheezing or shortness of breath.   Yes Historical Provider, MD  albuterol (PROVENTIL) (2.5 MG/3ML) 0.083% nebulizer solution  03/09/16  Yes Historical Provider, MD  budesonide (PULMICORT) 0.5 MG/2ML nebulizer solution Add 1 vial to 268ml of saline in rinse bottle. Irrigate sinuses with 121ml through each nostril twice daily 07/12/15  Yes Historical Provider, MD  fluticasone (FLONASE) 50 MCG/ACT nasal spray Place 2 sprays into both nostrils daily. 07/11/16  Yes Kathrine Haddock, NP  Fluticasone-Salmeterol (ADVAIR DISKUS) 500-50 MCG/DOSE AEPB Inhale 1 puff into the lungs 2 (two) times daily.   Yes Historical Provider, MD  montelukast (SINGULAIR) 10 MG tablet Take 10 mg by mouth.  01/19/15  Yes Historical Provider, MD  SPIRIVA HANDIHALER 18 MCG inhalation capsule Place into inhaler and inhale daily. 09/25/15  Yes Historical Provider, MD  theophylline (THEODUR) 200 MG 12 hr tablet Take 200 mg by mouth 2 (two) times daily.   Yes Historical Provider, MD  valsartan (DIOVAN) 320 MG tablet Take 1 tablet (320 mg total) by mouth daily.  05/09/16  Yes Kathrine Haddock, NP  vitamin B-12 (CYANOCOBALAMIN) 250 MCG tablet Take 250 mcg by mouth daily.   Yes Historical Provider, MD  vitamin C (ASCORBIC ACID) 500 MG tablet Take 500 mg by mouth daily.   Yes Historical Provider, MD  vitamin E (VITAMIN E) 400 UNIT capsule Take 400 Units by mouth daily.   Yes Historical Provider, MD    Allergies as of 05/10/2016 - Review Complete 05/10/2016  Allergen Reaction Noted  . Amlodipine besylate Swelling 05/04/2015  . Avelox [moxifloxacin hcl in nacl]  05/04/2015  . Penicillin v potassium Rash 05/04/2015    Family History  Problem Relation Age of Onset  . Heart disease Mother   . Hypertension Mother   . Cancer Father     melanoma  . Stroke Father     x2  . Hypertension Son   . ADD / ADHD Son     Social History   Social History  . Marital status: Married    Spouse name: N/A  . Number of children: N/A  . Years of education: N/A   Occupational History  . Not on file.   Social History Main Topics  . Smoking status: Never Smoker  . Smokeless tobacco: Never Used  . Alcohol use No  . Drug use: No  . Sexual activity: No   Other Topics Concern  . Not on file   Social History Narrative  . No narrative on file    Review of Systems: See HPI, otherwise  negative ROS  Physical Exam: BP 106/67 (BP Location: Left Arm)   Pulse 80   Temp 97.7 F (36.5 C) (Tympanic)   Resp (!) 22   Ht 5\' 10"  (1.778 m)   Wt (!) 325 lb (147.4 kg)   SpO2 94%   BMI 46.63 kg/m  General:   Alert,  pleasant and cooperative in NAD Head:  Normocephalic and atraumatic. Neck:  Supple; no masses or thyromegaly. Lungs:  Clear throughout to auscultation.    Heart:  Regular rate and rhythm. Abdomen:  Soft, nontender and nondistended. Normal bowel sounds, without guarding, and without rebound.   Neurologic:  Alert and  oriented x4;  grossly normal neurologically.  Impression/Plan: Thomas Calhoun is now here to undergo a screening  colonoscopy.  Risks, benefits, and alternatives regarding colonoscopy have been reviewed with the patient.  Questions have been answered.  All parties agreeable.

## 2016-07-18 NOTE — Anesthesia Preprocedure Evaluation (Signed)
Anesthesia Evaluation  Patient identified by MRN, date of birth, ID band Patient awake    Reviewed: Allergy & Precautions, NPO status , Patient's Chart, lab work & pertinent test results  History of Anesthesia Complications Negative for: history of anesthetic complications  Airway Mallampati: III  TM Distance: >3 FB Neck ROM: Full    Dental no notable dental hx.    Pulmonary asthma , sleep apnea and Continuous Positive Airway Pressure Ventilation , COPD,  COPD inhaler,    breath sounds clear to auscultation- rhonchi (-) wheezing      Cardiovascular hypertension, Pt. on medications (-) CAD and (-) Past MI  Rhythm:Regular Rate:Normal - Systolic murmurs and - Diastolic murmurs    Neuro/Psych negative psych ROS   GI/Hepatic negative GI ROS, Neg liver ROS,   Endo/Other  neg diabetesMorbid obesity  Renal/GU negative Renal ROS     Musculoskeletal negative musculoskeletal ROS (+)   Abdominal (+) + obese,   Peds  Hematology negative hematology ROS (+)   Anesthesia Other Findings Past Medical History: No date: Abnormal serum enzyme level No date: Asthma No date: Hyperlipidemia No date: Hypertension No date: IFG (impaired fasting glucose) No date: Meralgia paresthetica No date: Testicular hypofunction   Reproductive/Obstetrics                             Anesthesia Physical Anesthesia Plan  ASA: III  Anesthesia Plan: General   Post-op Pain Management:    Induction: Intravenous  Airway Management Planned: Natural Airway  Additional Equipment:   Intra-op Plan:   Post-operative Plan:   Informed Consent: I have reviewed the patients History and Physical, chart, labs and discussed the procedure including the risks, benefits and alternatives for the proposed anesthesia with the patient or authorized representative who has indicated his/her understanding and acceptance.   Dental advisory  given  Plan Discussed with: CRNA and Anesthesiologist  Anesthesia Plan Comments:         Anesthesia Quick Evaluation

## 2016-07-18 NOTE — Anesthesia Procedure Notes (Signed)
Date/Time: 07/18/2016 7:58 AM Performed by: Johnna Acosta Pre-anesthesia Checklist: Patient identified, Emergency Drugs available, Suction available, Patient being monitored and Timeout performed Patient Re-evaluated:Patient Re-evaluated prior to inductionOxygen Delivery Method: Nasal cannula

## 2016-07-18 NOTE — Transfer of Care (Cosign Needed)
Immediate Anesthesia Transfer of Care Note  Patient: Thomas Calhoun  Procedure(s) Performed: Procedure(s): COLONOSCOPY WITH PROPOFOL (N/A)  Patient Location: PACU  Anesthesia Type:General  Level of Consciousness: awake  Airway & Oxygen Therapy: Patient Spontanous Breathing and Patient connected to nasal cannula oxygen  Post-op Assessment: Report given to RN and Post -op Vital signs reviewed and stable  Post vital signs: Reviewed and stable  Last Vitals:  Vitals:   07/18/16 0734 07/18/16 0826  BP: 139/75 106/67  Pulse: 85 80  Resp: (!) 22 (!) 22  Temp: 37.1 C 36.5 C    Last Pain:  Vitals:   07/18/16 0826  TempSrc: Tympanic         Complications: No apparent anesthesia complications

## 2016-07-19 ENCOUNTER — Encounter: Payer: Self-pay | Admitting: Gastroenterology

## 2016-07-19 LAB — SURGICAL PATHOLOGY

## 2016-07-20 ENCOUNTER — Encounter: Payer: Self-pay | Admitting: Gastroenterology

## 2016-08-14 HISTORY — PX: INNER EAR SURGERY: SHX679

## 2016-10-25 ENCOUNTER — Encounter: Payer: Managed Care, Other (non HMO) | Admitting: Unknown Physician Specialty

## 2016-11-20 ENCOUNTER — Other Ambulatory Visit: Payer: Self-pay

## 2016-11-20 MED ORDER — VALSARTAN 320 MG PO TABS: 320.0000 mg | ORAL_TABLET | Freq: Every day | ORAL | 1 refills | Status: DC

## 2016-11-20 NOTE — Telephone Encounter (Signed)
Patient has appointment scheduled for 12/05/16.

## 2016-11-29 ENCOUNTER — Encounter: Payer: Self-pay | Admitting: Unknown Physician Specialty

## 2016-12-05 ENCOUNTER — Ambulatory Visit
Admission: RE | Admit: 2016-12-05 | Discharge: 2016-12-05 | Disposition: A | Discharge status: Home / Self Care | Payer: Managed Care, Other (non HMO) | Source: Ambulatory Visit | Attending: Unknown Physician Specialty | Admitting: Unknown Physician Specialty

## 2016-12-05 ENCOUNTER — Encounter: Payer: Self-pay | Admitting: Unknown Physician Specialty

## 2016-12-05 ENCOUNTER — Ambulatory Visit (INDEPENDENT_AMBULATORY_CARE_PROVIDER_SITE_OTHER): Payer: Managed Care, Other (non HMO) | Admitting: Unknown Physician Specialty

## 2016-12-05 VITALS — BP 148/83 | HR 64 | Temp 97.6°F | Ht 70.1 in | Wt 337.2 lb

## 2016-12-05 DIAGNOSIS — J4550 Severe persistent asthma, uncomplicated: Secondary | ICD-10-CM

## 2016-12-05 DIAGNOSIS — R0602 Shortness of breath: Secondary | ICD-10-CM

## 2016-12-05 DIAGNOSIS — Z0000 Encounter for general adult medical examination without abnormal findings: Secondary | ICD-10-CM

## 2016-12-05 DIAGNOSIS — R7301 Impaired fasting glucose: Secondary | ICD-10-CM | POA: Diagnosis not present

## 2016-12-05 DIAGNOSIS — I10 Essential (primary) hypertension: Secondary | ICD-10-CM

## 2016-12-05 LAB — MICROALBUMIN, URINE WAIVED
CREATININE, URINE WAIVED: 100 mg/dL (ref 10–300)
MICROALB, UR WAIVED: 10 mg/L (ref 0–19)

## 2016-12-05 LAB — BAYER DCA HB A1C WAIVED: HB A1C: 5.4 % (ref <7.0)

## 2016-12-05 MED ORDER — HYDROCHLOROTHIAZIDE 25 MG PO TABS: 25.0000 mg | ORAL_TABLET | Freq: Every day | ORAL | 0 refills | Status: DC

## 2016-12-05 NOTE — Progress Notes (Signed)
BP (!) 148/83 (BP Location: Left Arm, Cuff Size: Large)   Pulse 64   Temp 97.6 F (36.4 C)   Ht 5' 10.1" (1.781 m)   Wt (!) 337 lb 3.2 oz (153 kg)   SpO2 94%   BMI 48.25 kg/m    Subjective:    Patient ID: Thomas Calhoun, male    DOB: [birth year], 61 y.o.   MRN: [id number]  HPI: Thomas Calhoun is a 61 y.o. male  Chief Complaint  Patient presents with  . Annual Exam   Asthma Pt having a flare.  Pt states he did well when he spent 3 weeks in [place] and thinking about moving.  Sees a pulmonologist and recently put on Z pack and Prednisone.    Obesity Weight gain of 12 pounds.  Pt feels much of it is related to SOB and prednisone tapers. Note history of IFG  Hypertension Using medications without difficulty Average home BPs Not checking at home but about what it is here.     No problems or lightheadedness No chest pain with exertion or shortness of breath No Edema   Social History   Social History  . Marital status: Married    Spouse name: N/A  . Number of children: N/A  . Years of education: N/A   Occupational History  . Not on file.   Social History Main Topics  . Smoking status: Never Smoker  . Smokeless tobacco: Never Used  . Alcohol use No  . Drug use: No  . Sexual activity: No   Other Topics Concern  . Not on file   Social History Narrative  . No narrative on file   Family History  Problem Relation Age of Onset  . Heart disease Mother   . Hypertension Mother   . Cancer Father     melanoma  . Stroke Father     x2  . Hypertension Son   . ADD / ADHD Son    Past Medical History:  Diagnosis Date  . Abnormal serum enzyme level   . Asthma   . Hyperlipidemia   . Hypertension   . IFG (impaired fasting glucose)   . Meralgia paresthetica   . Testicular hypofunction    Past Surgical History:  Procedure Laterality Date  . APPENDECTOMY    . COLONOSCOPY WITH PROPOFOL N/A 07/18/2016   Procedure: COLONOSCOPY WITH PROPOFOL;  Surgeon: [name], MD;  Location: [facility] ENDOSCOPY;  Service: Endoscopy;  Laterality: N/A;  . NASAL SINUS SURGERY    . SMALL INTESTINE SURGERY       Relevant past medical, surgical, family and social history reviewed and updated as indicated. Interim medical history since our last visit reviewed. Allergies and medications reviewed and updated.  Review of Systems  Constitutional: Negative for fatigue.  HENT: Positive for congestion and rhinorrhea.   Eyes: Negative.   Respiratory: Positive for chest tightness, shortness of breath and wheezing.   Cardiovascular: Positive for leg swelling.  Gastrointestinal: Negative.   Endocrine: Positive for polyuria. Negative for polyphagia.  Skin: Positive for rash.       Left ankle scaley patch  Neurological: Negative.   Psychiatric/Behavioral: Negative.     Per HPI unless specifically indicated above     Objective:    BP (!) 148/83 (BP Location: Left Arm, Cuff Size: Large)   Pulse 64   Temp 97.6 F (36.4 C)   Ht 5' 10.1" (1.781 m)   Wt (!) 337 lb 3.2 oz (153 kg)  SpO2 94%   BMI 48.25 kg/m   Wt Readings from Last 3 Encounters:  12/05/16 (!) 337 lb 3.2 oz (153 kg)  07/18/16 (!) 325 lb (147.4 kg)  05/09/16 (!) 335 lb 3.2 oz (152 kg)    Physical Exam  Constitutional: He is oriented to person, place, and time. He appears well-developed and well-nourished.  HENT:  Head: Normocephalic.  Right Ear: Tympanic membrane, external ear and ear canal normal.  Left Ear: Tympanic membrane, external ear and ear canal normal.  Mouth/Throat: Uvula is midline, oropharynx is clear and moist and mucous membranes are normal.  Eyes: Pupils are equal, round, and reactive to light.  Cardiovascular: Normal rate, regular rhythm and normal heart sounds.  Exam reveals no gallop and no friction rub.   No murmur heard. Pulmonary/Chest: Effort normal. No respiratory distress. He has wheezes.  Abdominal: Soft. Bowel sounds are normal. He exhibits no distension. There is no  tenderness.  Genitourinary: Rectum normal.  Genitourinary Comments: Unable to palpate prostate due to size  Musculoskeletal: Normal range of motion.  Bilateral 1 plus edema bilateral ankles  Neurological: He is alert and oriented to person, place, and time. He has normal reflexes.  Skin: Skin is warm and dry.  scaley rash inside of left ankle  Psychiatric: He has a normal mood and affect. His behavior is normal. Judgment and thought content normal.     Assessment & Plan:   Problem List Items Addressed This Visit      Unprioritized   Asthma    Having a flare and treated by Dr. Raul Del      Relevant Medications   theophylline (THEO-24) 300 MG 24 hr capsule   predniSONE (DELTASONE) 10 MG tablet   Hypertension    Not to goal.  Add HCTZ        Relevant Medications   hydrochlorothiazide (HYDRODIURIL) 25 MG tablet   Other Relevant Orders   Comprehensive metabolic panel   Lipid Panel w/o Chol/HDL Ratio   TSH   Microalbumin, Urine Waived   Uric acid   IFG (impaired fasting glucose)    Check Hgb A1C      Relevant Orders   Bayer DCA Hb A1c Waived   Severe obesity (BMI >= 40) (HCC)    Pt. Ed.  Needs chest x-ray and DM evaluation       Other Visit Diagnoses    SOB (shortness of breath)    -  Primary   Probably related to asthma but vilateral ankle edema.  Order chest x-ray to r/o CHF   Relevant Orders   DG Chest 2 View   Routine general medical examination at a health care facility       Relevant Orders   CBC with Differential/Platelet       Follow up plan: Return in about 6 weeks (around 01/16/2017).

## 2016-12-05 NOTE — Assessment & Plan Note (Addendum)
Not to goal.  Add HCTZ 

## 2016-12-05 NOTE — Assessment & Plan Note (Signed)
Check Hgb A1C 

## 2016-12-05 NOTE — Assessment & Plan Note (Signed)
Pt. Ed.  Needs chest x-ray and DM evaluation

## 2016-12-05 NOTE — Assessment & Plan Note (Signed)
Having a flare and treated by Dr. Raul Del

## 2016-12-06 ENCOUNTER — Encounter: Payer: Self-pay | Admitting: Unknown Physician Specialty

## 2016-12-06 LAB — CBC WITH DIFFERENTIAL/PLATELET
BASOS ABS: 0 10*3/uL (ref 0.0–0.2)
Basos: 0 %
EOS (ABSOLUTE): 0.1 10*3/uL (ref 0.0–0.4)
Eos: 1 %
HEMOGLOBIN: 15.9 g/dL (ref 13.0–17.7)
Hematocrit: 44 % (ref 37.5–51.0)
Immature Grans (Abs): 0 10*3/uL (ref 0.0–0.1)
Immature Granulocytes: 0 %
LYMPHS ABS: 1.1 10*3/uL (ref 0.7–3.1)
Lymphs: 11 %
MCH: 36 pg — AB (ref 26.6–33.0)
MCHC: 36.1 g/dL — AB (ref 31.5–35.7)
MCV: 100 fL — ABNORMAL HIGH (ref 79–97)
Monocytes Absolute: 0.8 10*3/uL (ref 0.1–0.9)
Monocytes: 9 %
NEUTROS ABS: 7.6 10*3/uL — AB (ref 1.4–7.0)
Neutrophils: 79 %
PLATELETS: 161 10*3/uL (ref 150–379)
RBC: 4.42 x10E6/uL (ref 4.14–5.80)
RDW: 13.1 % (ref 12.3–15.4)
WBC: 9.6 10*3/uL (ref 3.4–10.8)

## 2016-12-06 LAB — COMPREHENSIVE METABOLIC PANEL
ALK PHOS: 51 IU/L (ref 39–117)
ALT: 32 IU/L (ref 0–44)
AST: 21 IU/L (ref 0–40)
Albumin/Globulin Ratio: 2.1 (ref 1.2–2.2)
Albumin: 4.4 g/dL (ref 3.6–4.8)
BUN/Creatinine Ratio: 16 (ref 10–24)
BUN: 16 mg/dL (ref 8–27)
Bilirubin Total: 1 mg/dL (ref 0.0–1.2)
CALCIUM: 9.3 mg/dL (ref 8.6–10.2)
CO2: 24 mmol/L (ref 18–29)
CREATININE: 1.03 mg/dL (ref 0.76–1.27)
Chloride: 100 mmol/L (ref 96–106)
GFR calc Af Amer: 91 mL/min/{1.73_m2} (ref >59)
GFR calc non Af Amer: 79 mL/min/{1.73_m2} (ref >59)
GLUCOSE: 106 mg/dL — AB (ref 65–99)
Globulin, Total: 2.1 g/dL (ref 1.5–4.5)
Potassium: 3.9 mmol/L (ref 3.5–5.2)
Sodium: 141 mmol/L (ref 134–144)
Total Protein: 6.5 g/dL (ref 6.0–8.5)

## 2016-12-06 LAB — URIC ACID: URIC ACID: 5.9 mg/dL (ref 3.7–8.6)

## 2016-12-06 LAB — LIPID PANEL W/O CHOL/HDL RATIO
CHOLESTEROL TOTAL: 185 mg/dL (ref 100–199)
HDL: 64 mg/dL (ref >39)
LDL CALC: 104 mg/dL — AB (ref 0–99)
TRIGLYCERIDES: 84 mg/dL (ref 0–149)
VLDL CHOLESTEROL CAL: 17 mg/dL (ref 5–40)

## 2016-12-06 LAB — TSH: TSH: 3.28 u[IU]/mL (ref 0.450–4.500)

## 2016-12-11 ENCOUNTER — Encounter: Payer: Self-pay | Admitting: Unknown Physician Specialty

## 2017-01-17 ENCOUNTER — Ambulatory Visit: Payer: Managed Care, Other (non HMO) | Admitting: Unknown Physician Specialty

## 2017-01-31 ENCOUNTER — Encounter: Payer: Self-pay | Admitting: Unknown Physician Specialty

## 2017-01-31 ENCOUNTER — Ambulatory Visit (INDEPENDENT_AMBULATORY_CARE_PROVIDER_SITE_OTHER): Payer: Managed Care, Other (non HMO) | Admitting: Unknown Physician Specialty

## 2017-01-31 ENCOUNTER — Other Ambulatory Visit: Payer: Self-pay

## 2017-01-31 ENCOUNTER — Ambulatory Visit: Payer: Managed Care, Other (non HMO) | Admitting: Unknown Physician Specialty

## 2017-01-31 DIAGNOSIS — I10 Essential (primary) hypertension: Secondary | ICD-10-CM

## 2017-01-31 NOTE — Progress Notes (Signed)
BP 137/72   Pulse 77   Temp 98.4 F (36.9 C)   Wt (!) 340 lb (154.2 kg)   SpO2 93%   BMI 48.65 kg/m    Subjective:    Patient ID: Thomas Calhoun, male    DOB: February 11, 1956, 61 y.o.   MRN: [id number]  HPI: Thomas Calhoun is a 61 y.o. male  Chief Complaint  Patient presents with  . Hypertension    6 week f/up   Hypertension Using medications without difficulty Average home BPs Not checking   No problems or lightheadedness No chest pain with exertion or shortness of breath No Edema   Relevant past medical, surgical, family and social history reviewed and updated as indicated. Interim medical history since our last visit reviewed. Allergies and medications reviewed and updated.  Review of Systems  Per HPI unless specifically indicated above     Objective:    BP 137/72   Pulse 77   Temp 98.4 F (36.9 C)   Wt (!) 340 lb (154.2 kg)   SpO2 93%   BMI 48.65 kg/m   Wt Readings from Last 3 Encounters:  01/31/17 (!) 340 lb (154.2 kg)  12/05/16 (!) 337 lb 3.2 oz (153 kg)  07/18/16 (!) 325 lb (147.4 kg)    Physical Exam  Constitutional: He is oriented to person, place, and time. He appears well-developed and well-nourished. No distress.  HENT:  Head: Normocephalic and atraumatic.  Eyes: Conjunctivae and lids are normal. Right eye exhibits no discharge. Left eye exhibits no discharge. No scleral icterus.  Neck: Normal range of motion. Neck supple. No JVD present. Carotid bruit is not present.  Cardiovascular: Normal rate, regular rhythm and normal heart sounds.   Pulmonary/Chest: Effort normal. He has wheezes.  Abdominal: Normal appearance. There is no splenomegaly or hepatomegaly.  Musculoskeletal: Normal range of motion.  Neurological: He is alert and oriented to person, place, and time.  Skin: Skin is warm, dry and intact. No rash noted. No pallor.  Psychiatric: He has a normal mood and affect. His behavior is normal. Judgment and thought content normal.     Results for orders placed or performed in visit on 12/05/16  Comprehensive metabolic panel  Result Value Ref Range   Glucose 106 (H) 65 - 99 mg/dL   BUN 16 8 - 27 mg/dL   Creatinine, Ser 1.03 0.76 - 1.27 mg/dL   GFR calc non Af Amer 79 >59 mL/min/1.73   GFR calc Af Amer 91 >59 mL/min/1.73   BUN/Creatinine Ratio 16 10 - 24   Sodium 141 134 - 144 mmol/L   Potassium 3.9 3.5 - 5.2 mmol/L   Chloride 100 96 - 106 mmol/L   CO2 24 18 - 29 mmol/L   Calcium 9.3 8.6 - 10.2 mg/dL   Total Protein 6.5 6.0 - 8.5 g/dL   Albumin 4.4 3.6 - 4.8 g/dL   Globulin, Total 2.1 1.5 - 4.5 g/dL   Albumin/Globulin Ratio 2.1 1.2 - 2.2   Bilirubin Total 1.0 0.0 - 1.2 mg/dL   Alkaline Phosphatase 51 39 - 117 IU/L   AST 21 0 - 40 IU/L   ALT 32 0 - 44 IU/L  Lipid Panel w/o Chol/HDL Ratio  Result Value Ref Range   Cholesterol, Total 185 100 - 199 mg/dL   Triglycerides 84 0 - 149 mg/dL   HDL 64 >39 mg/dL   VLDL Cholesterol Cal 17 5 - 40 mg/dL   LDL Calculated 104 (H) 0 - 99 mg/dL  CBC with Differential/Platelet  Result Value Ref Range   WBC 9.6 3.4 - 10.8 x10E3/uL   RBC 4.42 4.14 - 5.80 x10E6/uL   Hemoglobin 15.9 13.0 - 17.7 g/dL   Hematocrit 44.0 37.5 - 51.0 %   MCV 100 (H) 79 - 97 fL   MCH 36.0 (H) 26.6 - 33.0 pg   MCHC 36.1 (H) 31.5 - 35.7 g/dL   RDW 13.1 12.3 - 15.4 %   Platelets 161 150 - 379 x10E3/uL   Neutrophils 79 Not Estab. %   Lymphs 11 Not Estab. %   Monocytes 9 Not Estab. %   Eos 1 Not Estab. %   Basos 0 Not Estab. %   Neutrophils Absolute 7.6 (H) 1.4 - 7.0 x10E3/uL   Lymphocytes Absolute 1.1 0.7 - 3.1 x10E3/uL   Monocytes Absolute 0.8 0.1 - 0.9 x10E3/uL   EOS (ABSOLUTE) 0.1 0.0 - 0.4 x10E3/uL   Basophils Absolute 0.0 0.0 - 0.2 x10E3/uL   Immature Granulocytes 0 Not Estab. %   Immature Grans (Abs) 0.0 0.0 - 0.1 x10E3/uL  TSH  Result Value Ref Range   TSH 3.280 0.450 - 4.500 uIU/mL  Bayer DCA Hb A1c Waived  Result Value Ref Range   Bayer DCA Hb A1c Waived 5.4 <7.0 %   Microalbumin, Urine Waived  Result Value Ref Range   Microalb, Ur Waived 10 0 - 19 mg/L   Creatinine, Urine Waived 100 10 - 300 mg/dL   Microalb/Creat Ratio <30 <30 mg/g  Uric acid  Result Value Ref Range   Uric Acid 5.9 3.7 - 8.6 mg/dL      Assessment & Plan:   Problem List Items Addressed This Visit      Unprioritized   Hypertension    Stable, continue present medications.            Follow up plan: Return in about 6 months (around 08/02/2017).

## 2017-01-31 NOTE — Assessment & Plan Note (Signed)
Stable, continue present medications.   

## 2017-02-28 ENCOUNTER — Telehealth: Payer: Self-pay

## 2017-02-28 MED ORDER — TELMISARTAN 80 MG PO TABS: 80.0000 mg | ORAL_TABLET | Freq: Every day | ORAL | 1 refills | Status: DC

## 2017-02-28 NOTE — Telephone Encounter (Signed)
Patient notified about medication change. Appointment scheduled for 04/03/17.

## 2017-02-28 NOTE — Telephone Encounter (Signed)
Pharmacy ([facility]) sent a fax regarding patient's valsartan prescription. It states "due to valsartan recall, do you want to consider alternative medication for this patient?" Please advise.

## 2017-03-05 ENCOUNTER — Telehealth: Payer: Self-pay | Admitting: Unknown Physician Specialty

## 2017-03-05 MED ORDER — LOSARTAN POTASSIUM 100 MG PO TABS: 100.0000 mg | ORAL_TABLET | Freq: Every day | ORAL | 3 refills | Status: DC

## 2017-03-05 NOTE — Telephone Encounter (Signed)
Called and let patient know that Losartan was called in for him to [facility]. Patient would like medication sent to [facility] instead. Will D/C at [facility] and call prescription into [facility].

## 2017-03-05 NOTE — Telephone Encounter (Signed)
Called prescription into [facility] for the patient.

## 2017-03-05 NOTE — Telephone Encounter (Signed)
Routing to provider for advice.

## 2017-03-05 NOTE — Telephone Encounter (Signed)
Thomas Calhoun, patient has called back. Please advise.

## 2017-03-05 NOTE — Telephone Encounter (Signed)
Change to Valsartan

## 2017-03-05 NOTE — Telephone Encounter (Signed)
Patient needing a new BP medication. Patient stopped taking medication because it is causing him to itch.  Patient would like to speak with provider.  Please Advise.  Thank you

## 2017-04-03 ENCOUNTER — Encounter: Payer: Self-pay | Admitting: Unknown Physician Specialty

## 2017-04-03 ENCOUNTER — Ambulatory Visit (INDEPENDENT_AMBULATORY_CARE_PROVIDER_SITE_OTHER): Payer: PRIVATE HEALTH INSURANCE | Admitting: Unknown Physician Specialty

## 2017-04-03 VITALS — BP 138/78 | HR 90 | Temp 98.7°F | Wt 344.8 lb

## 2017-04-03 DIAGNOSIS — I10 Essential (primary) hypertension: Secondary | ICD-10-CM

## 2017-04-03 DIAGNOSIS — L308 Other specified dermatitis: Secondary | ICD-10-CM

## 2017-04-03 MED ORDER — BETAMETHASONE DIPROPIONATE AUG 0.05 % EX CREA: TOPICAL_CREAM | Freq: Two times a day (BID) | CUTANEOUS | 0 refills | Status: DC

## 2017-04-03 NOTE — Assessment & Plan Note (Signed)
Tolerating Losartan.  Itching is improved with a couple of lesions on his finger.  Will continue Losartan

## 2017-04-03 NOTE — Progress Notes (Signed)
BP 138/78   Pulse 90   Temp 98.7 F (37.1 C)   Wt (!) 344 lb 12.8 oz (156.4 kg)   SpO2 93%   BMI 49.33 kg/m    Subjective:    Patient ID: Thomas Calhoun, male    DOB: Sep 12, 1955, 61 y.o.   MRN: [id number]  HPI: Thomas Calhoun is a 61 y.o. male  Chief Complaint  Patient presents with  . Hypertension    1 month f/up   Hypertension Using medications without difficulty.  Pateint needed to be switched from Valsartan due to drug recall.  Micardis caused itching.  He was then switched to Losartan which he seems to be tolerating well Average home BPs Not checking  No problems or lightheadedness No chest pain with exertion or shortness of breath No Edema    Relevant past medical, surgical, family and social history reviewed and updated as indicated. Interim medical history since our last visit reviewed. Allergies and medications reviewed and updated.  Review of Systems  Per HPI unless specifically indicated above     Objective:    BP 138/78   Pulse 90   Temp 98.7 F (37.1 C)   Wt (!) 344 lb 12.8 oz (156.4 kg)   SpO2 93%   BMI 49.33 kg/m   Wt Readings from Last 3 Encounters:  04/03/17 (!) 344 lb 12.8 oz (156.4 kg)  01/31/17 (!) 340 lb (154.2 kg)  12/05/16 (!) 337 lb 3.2 oz (153 kg)    Physical Exam  Constitutional: He is oriented to person, place, and time. He appears well-developed and well-nourished. No distress.  HENT:  Head: Normocephalic and atraumatic.  Eyes: Conjunctivae and lids are normal. Right eye exhibits no discharge. Left eye exhibits no discharge. No scleral icterus.  Neck: Normal range of motion. Neck supple. No JVD present. Carotid bruit is not present.  Cardiovascular: Normal rate, regular rhythm and normal heart sounds.   Pulmonary/Chest: Effort normal. No respiratory distress. He has wheezes.  Wheezes typical for him  Abdominal: Normal appearance. There is no splenomegaly or hepatomegaly.  Musculoskeletal: Normal range of motion.    Neurological: He is alert and oriented to person, place, and time.  Skin: Skin is warm, dry and intact. No rash noted. No pallor.  Psychiatric: He has a normal mood and affect. His behavior is normal. Judgment and thought content normal.    Results for orders placed or performed in visit on 12/05/16  Comprehensive metabolic panel  Result Value Ref Range   Glucose 106 (H) 65 - 99 mg/dL   BUN 16 8 - 27 mg/dL   Creatinine, Ser 1.03 0.76 - 1.27 mg/dL   GFR calc non Af Amer 79 >59 mL/min/1.73   GFR calc Af Amer 91 >59 mL/min/1.73   BUN/Creatinine Ratio 16 10 - 24   Sodium 141 134 - 144 mmol/L   Potassium 3.9 3.5 - 5.2 mmol/L   Chloride 100 96 - 106 mmol/L   CO2 24 18 - 29 mmol/L   Calcium 9.3 8.6 - 10.2 mg/dL   Total Protein 6.5 6.0 - 8.5 g/dL   Albumin 4.4 3.6 - 4.8 g/dL   Globulin, Total 2.1 1.5 - 4.5 g/dL   Albumin/Globulin Ratio 2.1 1.2 - 2.2   Bilirubin Total 1.0 0.0 - 1.2 mg/dL   Alkaline Phosphatase 51 39 - 117 IU/L   AST 21 0 - 40 IU/L   ALT 32 0 - 44 IU/L  Lipid Panel w/o Chol/HDL Ratio  Result Value Ref  Range   Cholesterol, Total 185 100 - 199 mg/dL   Triglycerides 84 0 - 149 mg/dL   HDL 64 >39 mg/dL   VLDL Cholesterol Cal 17 5 - 40 mg/dL   LDL Calculated 104 (H) 0 - 99 mg/dL  CBC with Differential/Platelet  Result Value Ref Range   WBC 9.6 3.4 - 10.8 x10E3/uL   RBC 4.42 4.14 - 5.80 x10E6/uL   Hemoglobin 15.9 13.0 - 17.7 g/dL   Hematocrit 44.0 37.5 - 51.0 %   MCV 100 (H) 79 - 97 fL   MCH 36.0 (H) 26.6 - 33.0 pg   MCHC 36.1 (H) 31.5 - 35.7 g/dL   RDW 13.1 12.3 - 15.4 %   Platelets 161 150 - 379 x10E3/uL   Neutrophils 79 Not Estab. %   Lymphs 11 Not Estab. %   Monocytes 9 Not Estab. %   Eos 1 Not Estab. %   Basos 0 Not Estab. %   Neutrophils Absolute 7.6 (H) 1.4 - 7.0 x10E3/uL   Lymphocytes Absolute 1.1 0.7 - 3.1 x10E3/uL   Monocytes Absolute 0.8 0.1 - 0.9 x10E3/uL   EOS (ABSOLUTE) 0.1 0.0 - 0.4 x10E3/uL   Basophils Absolute 0.0 0.0 - 0.2 x10E3/uL    Immature Granulocytes 0 Not Estab. %   Immature Grans (Abs) 0.0 0.0 - 0.1 x10E3/uL  TSH  Result Value Ref Range   TSH 3.280 0.450 - 4.500 uIU/mL  Bayer DCA Hb A1c Waived  Result Value Ref Range   Bayer DCA Hb A1c Waived 5.4 <7.0 %  Microalbumin, Urine Waived  Result Value Ref Range   Microalb, Ur Waived 10 0 - 19 mg/L   Creatinine, Urine Waived 100 10 - 300 mg/dL   Microalb/Creat Ratio <30 <30 mg/g  Uric acid  Result Value Ref Range   Uric Acid 5.9 3.7 - 8.6 mg/dL      Assessment & Plan:   Problem List Items Addressed This Visit      Unprioritized   Hypertension    Tolerating Losartan.  Itching is improved with a couple of lesions on his finger.  Will continue Losartan       Other Visit Diagnoses    Other eczema    -  Primary   related to Micardis.  Will rx diprolene       Follow up plan: Return if symptoms worsen or fail to improve.

## 2017-05-18 ENCOUNTER — Telehealth: Payer: Self-pay | Admitting: Unknown Physician Specialty

## 2017-05-18 NOTE — Telephone Encounter (Signed)
FYI: Patient called from work wanting to get scene this morning. Patient's co workers state he is out of breath. Patient states his breathing is okay and that he believes he messed up his sciatic nerve. Looked and saw the schedule for all providers book for this morning. Informed patient to go to UC or ER as soon as possible.  Please Advise.  Thank you

## 2017-05-18 NOTE — Telephone Encounter (Signed)
Thanks Tiffany!

## 2017-05-18 NOTE — Telephone Encounter (Signed)
Routing to provider, FYI.  

## 2017-06-26 ENCOUNTER — Telehealth: Payer: Self-pay | Admitting: Unknown Physician Specialty

## 2017-06-26 NOTE — Telephone Encounter (Signed)
Yes, please.  If it has been recalled, I am happy to change it

## 2017-06-26 NOTE — Telephone Encounter (Signed)
Called and spoke to patient. I advised him to call his pharmacy and ask if the prescription he has is recalled based on the lot number. I asked for the patient to call us back if it is so we can change it for him.

## 2017-06-26 NOTE — Telephone Encounter (Signed)
Thomas Calhoun, patient should contact his pharmacy to see if his medication has been recalled correct?

## 2017-06-26 NOTE — Telephone Encounter (Signed)
Copied from [place] [id number]. Topic: Quick Communication - See Telephone Encounter >> Jun 26, 2017  2:15 PM Clack, [name] wrote: CRM for notification. See Telephone encounter for:  Pt stated he would like to talk to a nurse about his  losartan (COZAAR) 100 MG tablet [[id number]] that is on the recall list. 06/26/17.

## 2017-08-22 ENCOUNTER — Ambulatory Visit: Payer: Managed Care, Other (non HMO) | Admitting: Unknown Physician Specialty

## 2017-09-24 ENCOUNTER — Ambulatory Visit: Payer: PRIVATE HEALTH INSURANCE | Admitting: Unknown Physician Specialty

## 2017-11-05 ENCOUNTER — Other Ambulatory Visit: Payer: Self-pay | Admitting: Unknown Physician Specialty

## 2017-11-05 NOTE — Telephone Encounter (Signed)
Theophylline level?

## 2017-11-09 ENCOUNTER — Other Ambulatory Visit: Payer: Self-pay | Admitting: Unknown Physician Specialty

## 2017-11-09 DIAGNOSIS — Z5181 Encounter for therapeutic drug level monitoring: Secondary | ICD-10-CM

## 2017-11-13 ENCOUNTER — Encounter: Payer: Self-pay | Admitting: Unknown Physician Specialty

## 2017-11-14 ENCOUNTER — Encounter: Payer: Self-pay | Admitting: Unknown Physician Specialty

## 2017-11-14 ENCOUNTER — Ambulatory Visit (INDEPENDENT_AMBULATORY_CARE_PROVIDER_SITE_OTHER): Payer: PRIVATE HEALTH INSURANCE | Admitting: Unknown Physician Specialty

## 2017-11-14 DIAGNOSIS — R55 Syncope and collapse: Secondary | ICD-10-CM

## 2017-11-14 DIAGNOSIS — J449 Chronic obstructive pulmonary disease, unspecified: Secondary | ICD-10-CM

## 2017-11-14 DIAGNOSIS — Z5181 Encounter for therapeutic drug level monitoring: Secondary | ICD-10-CM | POA: Diagnosis not present

## 2017-11-14 NOTE — Assessment & Plan Note (Addendum)
New problem.  With exertion.  Probably due to desaturation, but needs cardiology evaluation.

## 2017-11-14 NOTE — Progress Notes (Signed)
BP 133/73   Pulse 72   Temp 97.9 F (36.6 C) (Oral)   Ht 5\' 10"  (1.778 m)   Wt (!) 337 lb 3.2 oz (153 kg)   SpO2 95%   BMI 48.38 kg/m    Subjective:    Patient ID: Thomas Calhoun, male    DOB: Jul 30, 1956, 62 y.o.   MRN: [id number]  HPI: Thomas Calhoun is a 62 y.o. male  Chief Complaint  Patient presents with  . Hypertension  . Labs Only    needs theophylline level checked   Pt states that when he walks, even short distances, he has a "feeling" come over him and feels faint.  He need to stop and breath deeper and feels better.  No chest pain. States it is like "a wave" comes over him.     Hypertension Using medications without difficulty.  Concerned medication was recalled but it was not after checking lot numbers Average home BPs not checking   No problems or lightheadedness No chest pain with exertion or shortness of breath No Edema  Asthma Per Dr. Raul Del.  Pt is frustrated and on maximum treatment including Theophylline.  Pulse ox can't get above 95% and desats to 89% when walking.  [place] and could breath well.  Thinking of moving.  Theophyline levels not checked for a while  Relevant past medical, surgical, family and social history reviewed and updated as indicated. Interim medical history since our last visit reviewed. Allergies and medications reviewed and updated.  Review of Systems  Gastrointestinal: Negative.   Genitourinary: Negative.   Psychiatric/Behavioral: Negative.     Per HPI unless specifically indicated above     Objective:    BP 133/73   Pulse 72   Temp 97.9 F (36.6 C) (Oral)   Ht 5\' 10"  (1.778 m)   Wt (!) 337 lb 3.2 oz (153 kg)   SpO2 95%   BMI 48.38 kg/m   Wt Readings from Last 3 Encounters:  11/14/17 (!) 337 lb 3.2 oz (153 kg)  04/03/17 (!) 344 lb 12.8 oz (156.4 kg)  01/31/17 (!) 340 lb (154.2 kg)    Physical Exam  Constitutional: He is oriented to person, place, and time. He appears well-developed and  well-nourished. No distress.  HENT:  Head: Normocephalic and atraumatic.  Eyes: Conjunctivae and lids are normal. Right eye exhibits no discharge. Left eye exhibits no discharge. No scleral icterus.  Neck: Normal range of motion. Neck supple. No JVD present. Carotid bruit is not present.  Cardiovascular: Normal rate, regular rhythm and normal heart sounds.  Pulmonary/Chest: Effort normal and breath sounds normal. No respiratory distress.  Abdominal: Normal appearance. There is no splenomegaly or hepatomegaly.  Musculoskeletal: Normal range of motion.  Neurological: He is alert and oriented to person, place, and time.  Skin: Skin is warm, dry and intact. No rash noted. No pallor.  Psychiatric: He has a normal mood and affect. His behavior is normal. Judgment and thought content normal.   EKG wnl without STTW changes.    Results for orders placed or performed in visit on 12/05/16  Comprehensive metabolic panel  Result Value Ref Range   Glucose 106 (H) 65 - 99 mg/dL   BUN 16 8 - 27 mg/dL   Creatinine, Ser 1.03 0.76 - 1.27 mg/dL   GFR calc non Af Amer 79 >59 mL/min/1.73   GFR calc Af Amer 91 >59 mL/min/1.73   BUN/Creatinine Ratio 16 10 - 24   Sodium 141 134 -  144 mmol/L   Potassium 3.9 3.5 - 5.2 mmol/L   Chloride 100 96 - 106 mmol/L   CO2 24 18 - 29 mmol/L   Calcium 9.3 8.6 - 10.2 mg/dL   Total Protein 6.5 6.0 - 8.5 g/dL   Albumin 4.4 3.6 - 4.8 g/dL   Globulin, Total 2.1 1.5 - 4.5 g/dL   Albumin/Globulin Ratio 2.1 1.2 - 2.2   Bilirubin Total 1.0 0.0 - 1.2 mg/dL   Alkaline Phosphatase 51 39 - 117 IU/L   AST 21 0 - 40 IU/L   ALT 32 0 - 44 IU/L  Lipid Panel w/o Chol/HDL Ratio  Result Value Ref Range   Cholesterol, Total 185 100 - 199 mg/dL   Triglycerides 84 0 - 149 mg/dL   HDL 64 >39 mg/dL   VLDL Cholesterol Cal 17 5 - 40 mg/dL   LDL Calculated 104 (H) 0 - 99 mg/dL  CBC with Differential/Platelet  Result Value Ref Range   WBC 9.6 3.4 - 10.8 x10E3/uL   RBC 4.42 4.14 - 5.80  x10E6/uL   Hemoglobin 15.9 13.0 - 17.7 g/dL   Hematocrit 44.0 37.5 - 51.0 %   MCV 100 (H) 79 - 97 fL   MCH 36.0 (H) 26.6 - 33.0 pg   MCHC 36.1 (H) 31.5 - 35.7 g/dL   RDW 13.1 12.3 - 15.4 %   Platelets 161 150 - 379 x10E3/uL   Neutrophils 79 Not Estab. %   Lymphs 11 Not Estab. %   Monocytes 9 Not Estab. %   Eos 1 Not Estab. %   Basos 0 Not Estab. %   Neutrophils Absolute 7.6 (H) 1.4 - 7.0 x10E3/uL   Lymphocytes Absolute 1.1 0.7 - 3.1 x10E3/uL   Monocytes Absolute 0.8 0.1 - 0.9 x10E3/uL   EOS (ABSOLUTE) 0.1 0.0 - 0.4 x10E3/uL   Basophils Absolute 0.0 0.0 - 0.2 x10E3/uL   Immature Granulocytes 0 Not Estab. %   Immature Grans (Abs) 0.0 0.0 - 0.1 x10E3/uL  TSH  Result Value Ref Range   TSH 3.280 0.450 - 4.500 uIU/mL  Bayer DCA Hb A1c Waived  Result Value Ref Range   Bayer DCA Hb A1c Waived 5.4 <7.0 %  Microalbumin, Urine Waived  Result Value Ref Range   Microalb, Ur Waived 10 0 - 19 mg/L   Creatinine, Urine Waived 100 10 - 300 mg/dL   Microalb/Creat Ratio <30 <30 mg/g  Uric acid  Result Value Ref Range   Uric Acid 5.9 3.7 - 8.6 mg/dL      Assessment & Plan:   Problem List Items Addressed This Visit      Unprioritized   Asthma, chronic obstructive, without status asthmaticus ([place])    Significant problem.  Suspect dizzyness related to hypoxia.  Check Theophyline levels      Near syncope    New problem.  With exertion.  Probably due to desaturation, but needs cardiology evaluation.        Relevant Orders   Ambulatory referral to Cardiology   EKG 12-Lead (Completed)    Other Visit Diagnoses    Medication monitoring encounter       Relevant Orders   Comprehensive metabolic panel   CBC with Differential/Platelet       Follow up plan: Return for physical.

## 2017-11-14 NOTE — Assessment & Plan Note (Signed)
Significant problem.  Suspect dizzyness related to hypoxia.  Check Theophyline levels

## 2017-11-15 LAB — CBC WITH DIFFERENTIAL/PLATELET
Basophils Absolute: 0.1 10*3/uL (ref 0.0–0.2)
Basos: 1 %
EOS (ABSOLUTE): 0.6 10*3/uL — ABNORMAL HIGH (ref 0.0–0.4)
EOS: 7 %
HEMATOCRIT: 46.2 % (ref 37.5–51.0)
HEMOGLOBIN: 16.4 g/dL (ref 13.0–17.7)
IMMATURE GRANULOCYTES: 0 %
Immature Grans (Abs): 0 10*3/uL (ref 0.0–0.1)
Lymphocytes Absolute: 1.4 10*3/uL (ref 0.7–3.1)
Lymphs: 17 %
MCH: 35.7 pg — ABNORMAL HIGH (ref 26.6–33.0)
MCHC: 35.5 g/dL (ref 31.5–35.7)
MCV: 101 fL — ABNORMAL HIGH (ref 79–97)
MONOCYTES: 10 %
MONOS ABS: 0.8 10*3/uL (ref 0.1–0.9)
NEUTROS PCT: 65 %
Neutrophils Absolute: 5.1 10*3/uL (ref 1.4–7.0)
Platelets: 159 10*3/uL (ref 150–379)
RBC: 4.59 x10E6/uL (ref 4.14–5.80)
RDW: 13 % (ref 12.3–15.4)
WBC: 7.9 10*3/uL (ref 3.4–10.8)

## 2017-11-15 LAB — COMPREHENSIVE METABOLIC PANEL
ALK PHOS: 49 IU/L (ref 39–117)
ALT: 37 IU/L (ref 0–44)
AST: 32 IU/L (ref 0–40)
Albumin/Globulin Ratio: 2.3 — ABNORMAL HIGH (ref 1.2–2.2)
Albumin: 4.5 g/dL (ref 3.6–4.8)
BUN/Creatinine Ratio: 10 (ref 10–24)
BUN: 14 mg/dL (ref 8–27)
Bilirubin Total: 1.4 mg/dL — ABNORMAL HIGH (ref 0.0–1.2)
CO2: 25 mmol/L (ref 20–29)
CREATININE: 1.35 mg/dL — AB (ref 0.76–1.27)
Calcium: 9.4 mg/dL (ref 8.6–10.2)
Chloride: 103 mmol/L (ref 96–106)
GFR calc Af Amer: 66 mL/min/{1.73_m2} (ref >59)
GFR calc non Af Amer: 57 mL/min/{1.73_m2} — ABNORMAL LOW (ref >59)
GLUCOSE: 105 mg/dL — AB (ref 65–99)
Globulin, Total: 2 g/dL (ref 1.5–4.5)
Potassium: 3.7 mmol/L (ref 3.5–5.2)
Sodium: 145 mmol/L — ABNORMAL HIGH (ref 134–144)
Total Protein: 6.5 g/dL (ref 6.0–8.5)

## 2017-11-15 LAB — THEOPHYLLINE LEVEL: Theophylline, Serum: 4.5 ug/mL — ABNORMAL LOW (ref 10.0–20.0)

## 2017-11-15 NOTE — Progress Notes (Signed)
Notified through mychart A couple of things: Please share your Theophylline levels with your pulmonologist in case he wants to change your dose of medication.    Your kidney function is a little low.  A GFR of below 60 is considered low, but it is not at the level of being dangerous.  This can be low if you are a little dehydrated.  I would like to recheck this level at your physical that should be in the next 3 months.  Over the counter prescriptions medications such as Ibuprofen (Advil) or Naproxyn (Aleve) can be damaging to your kidneys and try to avoid those as much as possible.  Tylenol or Acetaminaphine or typically safer choices.

## 2017-12-02 ENCOUNTER — Other Ambulatory Visit: Payer: Self-pay | Admitting: Family Medicine

## 2017-12-03 NOTE — Telephone Encounter (Signed)
rx

## 2017-12-04 ENCOUNTER — Other Ambulatory Visit: Payer: Self-pay | Admitting: Family Medicine

## 2018-01-04 ENCOUNTER — Encounter: Payer: Self-pay | Admitting: Unknown Physician Specialty

## 2018-01-04 ENCOUNTER — Ambulatory Visit (INDEPENDENT_AMBULATORY_CARE_PROVIDER_SITE_OTHER): Payer: PRIVATE HEALTH INSURANCE | Admitting: Unknown Physician Specialty

## 2018-01-04 VITALS — BP 145/83 | HR 77 | Ht 70.25 in | Wt 337.0 lb

## 2018-01-04 DIAGNOSIS — G4733 Obstructive sleep apnea (adult) (pediatric): Secondary | ICD-10-CM

## 2018-01-04 DIAGNOSIS — J4550 Severe persistent asthma, uncomplicated: Secondary | ICD-10-CM | POA: Diagnosis not present

## 2018-01-04 DIAGNOSIS — I10 Essential (primary) hypertension: Secondary | ICD-10-CM | POA: Diagnosis not present

## 2018-01-04 DIAGNOSIS — J310 Chronic rhinitis: Secondary | ICD-10-CM

## 2018-01-04 DIAGNOSIS — K219 Gastro-esophageal reflux disease without esophagitis: Secondary | ICD-10-CM | POA: Insufficient documentation

## 2018-01-04 DIAGNOSIS — Z5181 Encounter for therapeutic drug level monitoring: Secondary | ICD-10-CM | POA: Diagnosis not present

## 2018-01-04 DIAGNOSIS — Z0000 Encounter for general adult medical examination without abnormal findings: Secondary | ICD-10-CM

## 2018-01-04 NOTE — Assessment & Plan Note (Signed)
Due to polyps.  Regular care with ENT

## 2018-01-04 NOTE — Assessment & Plan Note (Addendum)
Reviewed note from pulmonary.  New treatment pending.  Will follow.  Check Theophylline level with recent increase in dose

## 2018-01-04 NOTE — Assessment & Plan Note (Signed)
High today.  I suspect due to severe nasal congestion.  Recheck in 3 months

## 2018-01-04 NOTE — Assessment & Plan Note (Signed)
Uses nightly CPAP

## 2018-01-04 NOTE — Progress Notes (Signed)
BP (!) 145/83   Pulse 77   Ht 5' 10.25" (1.784 m)   Wt (!) 337 lb (152.9 kg)   SpO2 91%   BMI 48.01 kg/m    Subjective:    Patient ID: Thomas Calhoun, male    DOB: 11/28/1955, 62 y.o.   MRN: [id number]  HPI: Thomas Calhoun is a 62 y.o. male  Chief Complaint  Patient presents with  . Annual Exam   Asthma: Severe asthma well documented. Under the care of pulmonary.  Nasal congestions secondary to polyps also a problem.  Regular care with ENT.  Pulmonary notes reviewed and will try a biologic next week (Nucala).  Recent increase of Theophylline  Sleep apnea Uses CPAP QHS but difficulty due to nasal congestion.    Hypertension Using medications without difficulty Average home BPs Not cheking  No problems or lightheadedness No chest pain with exertion or shortness of breath No Edema  Past Medical History:  Diagnosis Date  . Abnormal serum enzyme level   . Asthma   . Hyperlipidemia   . Hypertension   . IFG (impaired fasting glucose)   . Meralgia paresthetica   . Testicular hypofunction    Family History  Problem Relation Age of Onset  . Heart disease Mother   . Hypertension Mother   . Cancer Father        melanoma  . Stroke Father        x2  . Hypertension Son   . ADD / ADHD Son    Past Medical History:  Diagnosis Date  . Abnormal serum enzyme level   . Asthma   . Hyperlipidemia   . Hypertension   . IFG (impaired fasting glucose)   . Meralgia paresthetica   . Testicular hypofunction    Past Surgical History:  Procedure Laterality Date  . APPENDECTOMY    . COLONOSCOPY WITH PROPOFOL N/A 07/18/2016   Procedure: COLONOSCOPY WITH PROPOFOL;  Surgeon: [name], MD;  Location: [facility] ENDOSCOPY;  Service: Endoscopy;  Laterality: N/A;  . NASAL SINUS SURGERY    . SMALL INTESTINE SURGERY      Relevant past medical, surgical, family and social history reviewed and updated as indicated. Interim medical history since our last visit reviewed. Allergies and  medications reviewed and updated.  Review of Systems  Constitutional: Negative.   HENT: Negative.   Respiratory: Negative.   Cardiovascular: Negative.   Genitourinary: Negative.   Psychiatric/Behavioral: Negative.    Per HPI unless specifically indicated above     Objective:    BP (!) 145/83   Pulse 77   Ht 5' 10.25" (1.784 m)   Wt (!) 337 lb (152.9 kg)   SpO2 91%   BMI 48.01 kg/m   Wt Readings from Last 3 Encounters:  01/04/18 (!) 337 lb (152.9 kg)  11/14/17 (!) 337 lb 3.2 oz (153 kg)  04/03/17 (!) 344 lb 12.8 oz (156.4 kg)    Physical Exam  Constitutional: He is oriented to person, place, and time. He appears well-developed and well-nourished.  HENT:  Head: Normocephalic.  Right Ear: Tympanic membrane, external ear and ear canal normal.  Left Ear: Tympanic membrane, external ear and ear canal normal.  Mouth/Throat: Uvula is midline, oropharynx is clear and moist and mucous membranes are normal.  Eyes: Pupils are equal, round, and reactive to light.  Cardiovascular: Normal rate, regular rhythm and normal heart sounds. Exam reveals no gallop and no friction rub.  No murmur heard. Pulmonary/Chest: Effort normal. No respiratory distress.  He has wheezes.  Expiratory wheezes throughout.  This is a typical exam finding for this pt  Abdominal: Soft. Bowel sounds are normal. He exhibits no distension. There is no tenderness.  Genitourinary: Rectum normal and prostate normal.  Musculoskeletal: Normal range of motion.  Neurological: He is alert and oriented to person, place, and time. He has normal reflexes.  Skin: Skin is warm and dry.  Psychiatric: He has a normal mood and affect. His behavior is normal. Judgment and thought content normal.    Results for orders placed or performed in visit on 11/14/17  Theophylline level  Result Value Ref Range   Theophylline, Serum 4.5 (L) 10.0 - 20.0 ug/mL  Comprehensive metabolic panel  Result Value Ref Range   Glucose 105 (H) 65 -  99 mg/dL   BUN 14 8 - 27 mg/dL   Creatinine, Ser 1.35 (H) 0.76 - 1.27 mg/dL   GFR calc non Af Amer 57 (L) >59 mL/min/1.73   GFR calc Af Amer 66 >59 mL/min/1.73   BUN/Creatinine Ratio 10 10 - 24   Sodium 145 (H) 134 - 144 mmol/L   Potassium 3.7 3.5 - 5.2 mmol/L   Chloride 103 96 - 106 mmol/L   CO2 25 20 - 29 mmol/L   Calcium 9.4 8.6 - 10.2 mg/dL   Total Protein 6.5 6.0 - 8.5 g/dL   Albumin 4.5 3.6 - 4.8 g/dL   Globulin, Total 2.0 1.5 - 4.5 g/dL   Albumin/Globulin Ratio 2.3 (H) 1.2 - 2.2   Bilirubin Total 1.4 (H) 0.0 - 1.2 mg/dL   Alkaline Phosphatase 49 39 - 117 IU/L   AST 32 0 - 40 IU/L   ALT 37 0 - 44 IU/L  CBC with Differential/Platelet  Result Value Ref Range   WBC 7.9 3.4 - 10.8 x10E3/uL   RBC 4.59 4.14 - 5.80 x10E6/uL   Hemoglobin 16.4 13.0 - 17.7 g/dL   Hematocrit 46.2 37.5 - 51.0 %   MCV 101 (H) 79 - 97 fL   MCH 35.7 (H) 26.6 - 33.0 pg   MCHC 35.5 31.5 - 35.7 g/dL   RDW 13.0 12.3 - 15.4 %   Platelets 159 150 - 379 x10E3/uL   Neutrophils 65 Not Estab. %   Lymphs 17 Not Estab. %   Monocytes 10 Not Estab. %   Eos 7 Not Estab. %   Basos 1 Not Estab. %   Neutrophils Absolute 5.1 1.4 - 7.0 x10E3/uL   Lymphocytes Absolute 1.4 0.7 - 3.1 x10E3/uL   Monocytes Absolute 0.8 0.1 - 0.9 x10E3/uL   EOS (ABSOLUTE) 0.6 (H) 0.0 - 0.4 x10E3/uL   Basophils Absolute 0.1 0.0 - 0.2 x10E3/uL   Immature Granulocytes 0 Not Estab. %   Immature Grans (Abs) 0.0 0.0 - 0.1 x10E3/uL      Assessment & Plan:   Problem List Items Addressed This Visit      Unprioritized   Chronic rhinitis    Due to polyps.  Regular care with ENT      Hypertension    High today.  I suspect due to severe nasal congestion.  Recheck in 3 months      Relevant Orders   Comprehensive metabolic panel   Lipid Panel w/o Chol/HDL Ratio   Obstructive sleep apnea    Uses nightly CPAP      Severe asthma    Reviewed note from pulmonary.  New treatment pending.  Will follow.  Check Theophylline level with recent  increase in dose  Relevant Medications   theophylline (UNIPHYL) 400 MG 24 hr tablet   Other Relevant Orders   Theophylline level    Other Visit Diagnoses    Annual physical exam    -  Primary   Relevant Orders   CBC with Differential/Platelet   Comprehensive metabolic panel   Lipid Panel w/o Chol/HDL Ratio   TSH   PSA   Medication monitoring encounter       Relevant Orders   Theophylline level       Follow up plan: Return in about 3 months (around 04/06/2018).

## 2018-01-05 LAB — CBC WITH DIFFERENTIAL/PLATELET
BASOS ABS: 0 10*3/uL (ref 0.0–0.2)
BASOS: 1 %
EOS (ABSOLUTE): 0.5 10*3/uL — ABNORMAL HIGH (ref 0.0–0.4)
EOS: 7 %
HEMATOCRIT: 47.8 % (ref 37.5–51.0)
HEMOGLOBIN: 16.3 g/dL (ref 13.0–17.7)
IMMATURE GRANS (ABS): 0 10*3/uL (ref 0.0–0.1)
Immature Granulocytes: 0 %
LYMPHS: 15 %
Lymphocytes Absolute: 1.1 10*3/uL (ref 0.7–3.1)
MCH: 35.4 pg — ABNORMAL HIGH (ref 26.6–33.0)
MCHC: 34.1 g/dL (ref 31.5–35.7)
MCV: 104 fL — AB (ref 79–97)
MONOCYTES: 9 %
Monocytes Absolute: 0.7 10*3/uL (ref 0.1–0.9)
NEUTROS ABS: 5 10*3/uL (ref 1.4–7.0)
Neutrophils: 68 %
Platelets: 156 10*3/uL (ref 150–450)
RBC: 4.61 x10E6/uL (ref 4.14–5.80)
RDW: 13.5 % (ref 12.3–15.4)
WBC: 7.4 10*3/uL (ref 3.4–10.8)

## 2018-01-05 LAB — COMPREHENSIVE METABOLIC PANEL
ALBUMIN: 4.4 g/dL (ref 3.6–4.8)
ALT: 36 IU/L (ref 0–44)
AST: 22 IU/L (ref 0–40)
Albumin/Globulin Ratio: 2.2 (ref 1.2–2.2)
Alkaline Phosphatase: 51 IU/L (ref 39–117)
BUN/Creatinine Ratio: 14 (ref 10–24)
BUN: 15 mg/dL (ref 8–27)
Bilirubin Total: 0.9 mg/dL (ref 0.0–1.2)
CHLORIDE: 102 mmol/L (ref 96–106)
CO2: 25 mmol/L (ref 20–29)
Calcium: 9.2 mg/dL (ref 8.6–10.2)
Creatinine, Ser: 1.09 mg/dL (ref 0.76–1.27)
GFR, EST AFRICAN AMERICAN: 84 mL/min/{1.73_m2} (ref >59)
GFR, EST NON AFRICAN AMERICAN: 73 mL/min/{1.73_m2} (ref >59)
GLOBULIN, TOTAL: 2 g/dL (ref 1.5–4.5)
GLUCOSE: 101 mg/dL — AB (ref 65–99)
POTASSIUM: 3.7 mmol/L (ref 3.5–5.2)
SODIUM: 143 mmol/L (ref 134–144)
TOTAL PROTEIN: 6.4 g/dL (ref 6.0–8.5)

## 2018-01-05 LAB — LIPID PANEL W/O CHOL/HDL RATIO
Cholesterol, Total: 195 mg/dL (ref 100–199)
HDL: 64 mg/dL (ref >39)
LDL Calculated: 113 mg/dL — ABNORMAL HIGH (ref 0–99)
Triglycerides: 89 mg/dL (ref 0–149)
VLDL Cholesterol Cal: 18 mg/dL (ref 5–40)

## 2018-01-05 LAB — PSA: PROSTATE SPECIFIC AG, SERUM: 0.6 ng/mL (ref 0.0–4.0)

## 2018-01-05 LAB — THEOPHYLLINE LEVEL: THEOPHYLLINE LVL: 3.3 ug/mL — AB (ref 10.0–20.0)

## 2018-01-05 LAB — TSH: TSH: 4.66 u[IU]/mL — AB (ref 0.450–4.500)

## 2018-02-12 ENCOUNTER — Telehealth: Payer: Self-pay | Admitting: Unknown Physician Specialty

## 2018-02-12 NOTE — Telephone Encounter (Signed)
I'm not clear what he needs.

## 2018-02-13 NOTE — Telephone Encounter (Signed)
Called and spoke to the patient. He states that he needs a letter sent to [employer] saying that he no longer needs the oxygen equipment in his home. He states that the equipment was given to him when he was in the hospital at [facility] for a recent ear surgery. He states that the oxygen was ordered and used to replace his CPAP machine for the past 3 weeks because the CPAP would put to much pressure on the surgery site. Is it OK to write this letter? Patient states he is back using his CPAP and no longer needs the oxygen equipment. He states these supplies were ordered by our office.

## 2018-02-13 NOTE — Telephone Encounter (Signed)
Letter typed, signed by Malachy Mood and faxed back to [employer] at [phone] per patient request. Patient notified that this was done for him.

## 2018-02-13 NOTE — Telephone Encounter (Signed)
OK to write    Thanks!

## 2018-02-21 ENCOUNTER — Other Ambulatory Visit: Payer: Self-pay | Admitting: Unknown Physician Specialty

## 2018-02-21 NOTE — Telephone Encounter (Signed)
losartan refill Last Refill:03/05/17 # 90 3 RF Last OV: 01/04/18 PCP: Kathrine Haddock NP Pharmacy:[facility] [address]

## 2018-03-12 ENCOUNTER — Encounter: Payer: Self-pay | Admitting: Unknown Physician Specialty

## 2018-04-09 ENCOUNTER — Ambulatory Visit: Payer: PRIVATE HEALTH INSURANCE | Admitting: Physician Assistant

## 2018-04-09 ENCOUNTER — Ambulatory Visit: Payer: PRIVATE HEALTH INSURANCE | Admitting: Unknown Physician Specialty

## 2018-05-07 ENCOUNTER — Encounter: Payer: Self-pay | Admitting: Physician Assistant

## 2018-05-07 ENCOUNTER — Ambulatory Visit: Payer: PRIVATE HEALTH INSURANCE | Admitting: Physician Assistant

## 2018-05-07 VITALS — BP 116/73 | HR 74 | Temp 98.7°F | Ht 70.0 in | Wt 339.4 lb

## 2018-05-07 DIAGNOSIS — I10 Essential (primary) hypertension: Secondary | ICD-10-CM

## 2018-05-07 DIAGNOSIS — R7989 Other specified abnormal findings of blood chemistry: Secondary | ICD-10-CM | POA: Diagnosis not present

## 2018-05-07 MED ORDER — LOSARTAN POTASSIUM 100 MG PO TABS: 100.0000 mg | ORAL_TABLET | Freq: Every day | ORAL | 0 refills | Status: DC

## 2018-05-07 NOTE — Patient Instructions (Signed)

## 2018-05-07 NOTE — Progress Notes (Signed)
   Subjective:    Patient ID: Thomas Calhoun, male    DOB: 11/09/1955, 62 y.o.   MRN: [id number]  Thomas Calhoun is a 62 y.o. male presenting on 05/07/2018 for Hypertension   HPI   HTN: BP elevated at last visit, here for recheck. BP has normalized, he is currently on Losartan 100 mg and HCTZ 25 mg daily. Denies chest pain, SOB, headache.  BP Readings from Last 3 Encounters:  05/07/18 116/73  01/04/18 (!) 145/83  11/14/17 133/73   Elevated TSH: TSH slightly elevated at last visit. Denies cold intolerance, weight gain, constipation.   Social History   Tobacco Use  . Smoking status: Never Smoker  . Smokeless tobacco: Never Used  Substance Use Topics  . Alcohol use: No    Alcohol/week: 0.0 standard drinks  . Drug use: No    Review of Systems Per HPI unless specifically indicated above     Objective:    BP 116/73 (BP Location: Left Arm, Cuff Size: Large)   Pulse 74   Temp 98.7 F (37.1 C) (Oral)   Ht 5\' 10"  (1.778 m)   Wt (!) 339 lb 6.4 oz (154 kg)   SpO2 92%   BMI 48.70 kg/m    BP Readings from Last 3 Encounters:  05/07/18 116/73  01/04/18 (!) 145/83  11/14/17 133/73    Wt Readings from Last 3 Encounters:  05/07/18 (!) 339 lb 6.4 oz (154 kg)  01/04/18 (!) 337 lb (152.9 kg)  11/14/17 (!) 337 lb 3.2 oz (153 kg)    Physical Exam  Constitutional: He is oriented to person, place, and time. He appears well-developed and well-nourished.  Cardiovascular: Normal rate and regular rhythm.  Pulmonary/Chest: Effort normal and breath sounds normal.  Neurological: He is alert and oriented to person, place, and time.  Skin: Skin is warm and dry.  Psychiatric: He has a normal mood and affect. His behavior is normal.   Results for orders placed or performed in visit on 05/07/18  TSH+T4F+T3Free  Result Value Ref Range   TSH 3.290 0.450 - 4.500 uIU/mL   T3, Free 3.7 2.0 - 4.4 pg/mL   Free T4 1.15 0.82 - 1.77 ng/dL      Assessment & Plan:  1. Essential  hypertension  Normalized, continue current medications.  - losartan (COZAAR) 100 MG tablet; Take 1 tablet (100 mg total) by mouth daily.  Dispense: 90 tablet; Refill: 0  2. Elevated TSH  Follow up labs as below.  - TSH+T4F+T3Free    Follow up plan: Return in about 3 months (around 08/06/2018) for htn, thyroid .  Carles Collet, PA-C [facility] 05/08/2018, 12:51 PM

## 2018-05-08 LAB — TSH+T4F+T3FREE
Free T4: 1.15 ng/dL (ref 0.82–1.77)
T3, Free: 3.7 pg/mL (ref 2.0–4.4)
TSH: 3.29 u[IU]/mL (ref 0.450–4.500)

## 2018-07-05 ENCOUNTER — Other Ambulatory Visit: Payer: Self-pay | Admitting: Family Medicine

## 2018-07-05 NOTE — Telephone Encounter (Signed)
Requested medication (s) are due for refill today: yes  Requested medication (s) are on the active medication list: No, medication not on current med list  Last refill:  04/12/18  Future visit scheduled: yes  Notes to clinic:  Medication not on pt's current medication list    Requested Prescriptions  Pending Prescriptions Disp Refills   azelastine (ASTELIN) 0.1 % nasal spray [Pharmacy Med Name: AZELASTINE 0.1%(137MCG) NASAL-200SP] 30 mL 0    Sig: USE 2 SPRAYS IN EACH NOSTRIL TWICE DAILY     Ear, Nose, and Throat: Nasal Preparations - Antiallergy Passed - 07/05/2018  5:20 PM      Passed - Valid encounter within last 12 months    Recent Outpatient Visits          1 month ago Essential hypertension   [place], [place], PA-C   6 months ago Annual physical exam   [facility] Kathrine Haddock, NP   7 months ago Medication monitoring encounter   [facility] Kathrine Haddock, NP   1 year ago Other eczema   [place] Kathrine Haddock, NP   1 year ago Essential hypertension   [place] Kathrine Haddock, NP      Future Appointments            In 1 month Cannady, Barbaraann Faster, NP [employer]

## 2018-08-03 ENCOUNTER — Other Ambulatory Visit: Payer: Self-pay | Admitting: Physician Assistant

## 2018-08-03 DIAGNOSIS — I10 Essential (primary) hypertension: Secondary | ICD-10-CM

## 2018-08-12 ENCOUNTER — Encounter: Payer: Self-pay | Admitting: Nurse Practitioner

## 2018-08-12 ENCOUNTER — Ambulatory Visit: Payer: PRIVATE HEALTH INSURANCE | Admitting: Nurse Practitioner

## 2018-08-12 VITALS — BP 138/70 | HR 73 | Temp 98.3°F | Ht 70.0 in | Wt 343.0 lb

## 2018-08-12 DIAGNOSIS — E782 Mixed hyperlipidemia: Secondary | ICD-10-CM

## 2018-08-12 DIAGNOSIS — R7989 Other specified abnormal findings of blood chemistry: Secondary | ICD-10-CM

## 2018-08-12 DIAGNOSIS — I10 Essential (primary) hypertension: Secondary | ICD-10-CM | POA: Diagnosis not present

## 2018-08-12 MED ORDER — LOSARTAN POTASSIUM 100 MG PO TABS: 100.0000 mg | ORAL_TABLET | Freq: Every day | ORAL | 3 refills | Status: DC

## 2018-08-12 NOTE — Assessment & Plan Note (Signed)
Chronic, ongoing.  Initial BP slightly elevated and continues to have severe nasal congestion.  Repeat BP 138/70 with large cuff manually.  Recheck in 3 months.  CMP today.

## 2018-08-12 NOTE — Progress Notes (Signed)
BP 138/70 (BP Location: Left Arm, Patient Position: Sitting, Cuff Size: Large)   Pulse 73   Temp 98.3 F (36.8 C) (Oral)   Ht 5\' 10"  (1.778 m)   Wt (!) 343 lb (155.6 kg)   SpO2 93%   BMI 49.22 kg/m    Subjective:    Patient ID: Thomas Calhoun, male    DOB: [birth year], 62 y.o.   MRN: [id number]  HPI: AADI BORDNER is a 62 y.o. male presents for HTN and Thyroid follow-up  Chief Complaint  Patient presents with  . Hypertension    Running about 180s/80something   . Thyroid Problem   HYPERTENSION Hypertension status: stable  Satisfied with current treatment? yes Duration of hypertension: chronic BP monitoring frequency:  rarely BP range: only checks at provider offices and is 160-180/80's BP medication side effects:  no Medication compliance: good compliance Previous BP meds: Amlodipine Aspirin: no Recurrent headaches: no Visual changes: no Palpitations: no Dyspnea: no Chest pain: no Lower extremity edema: no Dizzy/lightheaded: no   HYPERLIPIDEMIA Currently no medications, diet controlled.   The 10-year ASCVD risk score Mikey Bussing DC Brooke Bonito., et al., 2013) is: 10.2%   Values used to calculate the score:     Age: 59 years     Sex: Male     Is Non-Hispanic African American: No     Diabetic: No     Tobacco smoker: No     Systolic Blood Pressure: 790 mmHg     Is BP treated: Yes     HDL Cholesterol: 64 mg/dL     Total Cholesterol: 195 mg/dL Chest pain:  no Coronary artery disease:  no Family history CAD:  no Family history early CAD:  no  HYPOTHYROIDISM Previously had elevation 7 months ago with TSH 4.660, but recheck 3 months ago was 3.290.  Not currently on medication for thyroid.  Denies constipation, fatigue, or cold intolerance.   Relevant past medical, surgical, family and social history reviewed and updated as indicated. Interim medical history since our last visit reviewed. Allergies and medications reviewed and updated.  Review of Systems    Constitutional: Negative for activity change, diaphoresis, fatigue and fever.  Respiratory: Negative for cough, chest tightness, shortness of breath and wheezing.   Cardiovascular: Negative for chest pain, palpitations and leg swelling.  Gastrointestinal: Negative for abdominal distention, abdominal pain, constipation, diarrhea, nausea and vomiting.  Endocrine: Negative for cold intolerance, heat intolerance, polydipsia, polyphagia and polyuria.  Musculoskeletal: Negative.   Skin: Negative.   Neurological: Negative for dizziness, syncope, weakness, light-headedness, numbness and headaches.  Psychiatric/Behavioral: Negative.     Per HPI unless specifically indicated above     Objective:    BP 138/70 (BP Location: Left Arm, Patient Position: Sitting, Cuff Size: Large)   Pulse 73   Temp 98.3 F (36.8 C) (Oral)   Ht 5\' 10"  (1.778 m)   Wt (!) 343 lb (155.6 kg)   SpO2 93%   BMI 49.22 kg/m   Wt Readings from Last 3 Encounters:  08/12/18 (!) 343 lb (155.6 kg)  05/07/18 (!) 339 lb 6.4 oz (154 kg)  01/04/18 (!) 337 lb (152.9 kg)    Physical Exam Vitals signs and nursing note reviewed.  Constitutional:      Appearance: He is well-developed. He is obese.  HENT:     Head: Normocephalic and atraumatic.     Right Ear: Hearing normal. No drainage.     Left Ear: Hearing normal. No drainage.     Mouth/Throat:  Pharynx: Uvula midline.  Eyes:     General: Lids are normal.        Right eye: No discharge.        Left eye: No discharge.     Conjunctiva/sclera: Conjunctivae normal.     Pupils: Pupils are equal, round, and reactive to light.  Neck:     Musculoskeletal: Normal range of motion and neck supple.     Thyroid: No thyromegaly.     Vascular: No carotid bruit or JVD.     Trachea: Trachea normal.  Cardiovascular:     Rate and Rhythm: Normal rate and regular rhythm.     Heart sounds: Normal heart sounds, S1 normal and S2 normal. No murmur. No gallop.   Pulmonary:     Effort:  Pulmonary effort is normal.     Breath sounds: Normal breath sounds.  Abdominal:     General: Bowel sounds are normal.     Palpations: Abdomen is soft. There is no hepatomegaly or splenomegaly.  Musculoskeletal: Normal range of motion.  Skin:    General: Skin is warm and dry.     Capillary Refill: Capillary refill takes less than 2 seconds.     Findings: No rash.  Neurological:     Mental Status: He is alert and oriented to person, place, and time.     Deep Tendon Reflexes: Reflexes are normal and symmetric.  Psychiatric:        Mood and Affect: Mood normal.        Behavior: Behavior normal.        Thought Content: Thought content normal.        Judgment: Judgment normal.     Results for orders placed or performed in visit on 05/07/18  TSH+T4F+T3Free  Result Value Ref Range   TSH 3.290 0.450 - 4.500 uIU/mL   T3, Free 3.7 2.0 - 4.4 pg/mL   Free T4 1.15 0.82 - 1.77 ng/dL      Assessment & Plan:   Problem List Items Addressed This Visit      Cardiovascular and Mediastinum   Hypertension - Primary    Chronic, ongoing.  Initial BP slightly elevated and continues to have severe nasal congestion.  Repeat BP 138/70 with large cuff manually.  Recheck in 3 months.  CMP today.      Relevant Medications   losartan (COZAAR) 100 MG tablet   Other Relevant Orders   Comprehensive metabolic panel     Other   Hyperlipidemia    Recheck lipid panel today.  No current medications.  Discussed risks with him today and discussed cholesterol medication.      Relevant Medications   losartan (COZAAR) 100 MG tablet   Other Relevant Orders   Comprehensive metabolic panel   Lipid Panel w/o Chol/HDL Ratio   Elevated TSH    Improved on recent labs.  Recheck TSH.      Relevant Orders   Thyroid Panel With TSH       Follow up plan: Return in about 3 months (around 11/11/2018) for HLD/HTN.

## 2018-08-12 NOTE — Patient Instructions (Signed)
High Triglycerides Eating Plan  Triglycerides are a type of fat in the blood. High levels of triglycerides can increase your risk of heart disease and stroke. If your triglyceride levels are high, choosing the right foods can help lower your triglycerides and keep your heart healthy. Work with your health care provider or a diet and nutrition specialist (dietitian) to develop an eating plan that is right for you.  What are tips for following this plan?  General guidelines    · Lose weight, if you are overweight. For most people, losing 5-10 lbs (2-5 kg) helps lower triglyceride levels. A weight-loss plan may include.  ? 30 minutes of exercise at least 5 days a week.  ? Reducing the amount of calories, sugar, and fat you eat.  · Eat a wide variety of fresh fruits, vegetables, and whole grains. These foods are high in fiber.  · Eat foods that contain healthy fats, such as fatty fish, nuts, seeds, and olive oil.  · Avoid foods that are high in added sugar, added salt (sodium), saturated fat, and trans fat.  · Avoid low-fiber, refined carbohydrates such as white bread, crackers, noodles, and white rice.  · Avoid foods with partially hydrogenated oils (trans fats), such as fried foods or stick margarine.  · Limit alcohol intake to no more than 1 drink a day for nonpregnant women and 2 drinks a day for men. One drink equals 12 oz of beer, 5 oz of wine, or 1½ oz of hard liquor. Your health care provider may recommend that you drink less depending on your overall health.  Reading food labels  · Check food labels for the amount of saturated fat. Choose foods with no or very little saturated fat.  · Check food labels for the amount of trans fat. Choose foods with no trans fat.  · Check food labels for the amount of cholesterol. Choose foods low in cholesterol. Ask your dietitian how much cholesterol you should have each day.  · Check food labels for the amount of sodium. Choose foods with less than 140 milligrams (mg) per  serving.  Shopping  · Buy dairy products labeled as nonfat (skim) or low-fat (1%).  · Avoid buying processed or prepackaged foods. These are often high in added sugar, sodium, and fat.  Cooking  · Choose healthy fats when cooking, such as olive oil or canola oil.  · Cook foods using lower fat methods, such as baking, broiling, boiling, or grilling.  · Make your own sauces, dressings, and marinades when possible, instead of buying them. Store-bought sauces, dressings, and marinades are often high in sodium and sugar.  Meal planning  · Eat more home-cooked food and less restaurant, buffet, and fast food.  · Eat fatty fish at least 2 times each week. Examples of fatty fish include salmon, trout, mackerel, tuna, and herring.  · If you eat whole eggs, do not eat more than 3 egg yolks per week.  What foods are recommended?  The items listed may not be a complete list. Talk with your dietitian about what dietary choices are best for you.  Grains  Whole wheat or whole grain breads, crackers, cereals, and pasta. Unsweetened oatmeal. Bulgur. Barley. Quinoa. Brown rice. Whole wheat flour tortillas.  Vegetables  Fresh or frozen vegetables. Low-sodium canned vegetables.  Fruits  All fresh, canned (in natural juice), or frozen fruits.  Meats and other protein foods  Skinless chicken or turkey. Ground chicken or turkey. Lean cuts of pork, trimmed of   fat. Fish and seafood, especially salmon, trout, and herring. Egg whites. Dried beans, peas, or lentils. Unsalted nuts or seeds. Unsalted canned beans. Natural peanut or almond butter.  Dairy  Low-fat dairy products. Skim or low-fat (1%) milk. Reduced fat (2%) and low-sodium cheese. Low-fat ricotta cheese. Low-fat cottage cheese. Plain, low-fat yogurt.  Fats and oils  Tub margarine without trans fats. Light or reduced-fat mayonnaise. Light or reduced-fat salad dressings. Avocado. Safflower, olive, sunflower, soybean, and canola oils.  What foods are not recommended?  The items listed  may not be a complete list. Talk with your dietitian about what dietary choices are best for you.  Grains  White bread. White (regular) pasta. White rice. Cornbread. Bagels. Pastries. Crackers that contain trans fat.  Vegetables  Creamed or fried vegetables. Vegetables in a cheese sauce.  Fruits  Sweetened dried fruit. Canned fruit in syrup. Fruit juice.  Meats and other protein foods  Fatty cuts of meat. Ribs. Chicken wings. Bacon. Sausage. Bologna. Salami. Chitterlings. Fatback. Hot dogs. Bratwurst. Packaged lunch meats.  Dairy  Whole or reduced-fat (2%) milk. Half-and-half. Cream cheese. Full-fat or sweetened yogurt. Full-fat cheese. Nondairy creamers. Whipped toppings. Processed cheese or cheese spreads. Cheese curds.  Beverages  Alcohol. Sweetened drinks, such as soda, lemonade, fruit drinks, or punches.  Fats and oils  Butter. Stick margarine. Lard. Shortening. Ghee. Bacon fat. Tropical oils, such as coconut, palm kernel, or palm oils.  Sweets and desserts  Corn syrup. Sugars. Honey. Molasses. Candy. Jam and jelly. Syrup. Sweetened cereals. Cookies. Pies. Cakes. Donuts. Muffins. Ice cream.  Condiments  Store-bought sauces, dressings, and marinades that are high in sugar, such as ketchup and barbecue sauce.  Summary  · High levels of triglycerides can increase the risk of heart disease and stroke. Choosing the right foods can help lower your triglycerides.  · Eat plenty of fresh fruits, vegetables, and whole grains. Choose low-fat dairy and lean meats. Eat fatty fish at least twice a week.  · Avoid processed and prepackaged foods with added sugar, sodium, saturated fat, and trans fat.  · If you need suggestions or have questions about what types of food are good for you, talk with your health care provider or a dietitian.  This information is not intended to replace advice given to you by your health care provider. Make sure you discuss any questions you have with your health care provider.  Document Released:  05/18/2004 Document Revised: 10/03/2016 Document Reviewed: 10/03/2016  Elsevier Interactive Patient Education © 2019 [employer]

## 2018-08-12 NOTE — Assessment & Plan Note (Signed)
Recheck lipid panel today.  No current medications.  Discussed risks with him today and discussed cholesterol medication.

## 2018-08-12 NOTE — Assessment & Plan Note (Signed)
Improved on recent labs.  Recheck TSH.

## 2018-08-13 LAB — COMPREHENSIVE METABOLIC PANEL
ALBUMIN: 4.7 g/dL (ref 3.6–4.8)
ALT: 31 IU/L (ref 0–44)
AST: 27 IU/L (ref 0–40)
Albumin/Globulin Ratio: 2.6 — ABNORMAL HIGH (ref 1.2–2.2)
Alkaline Phosphatase: 56 IU/L (ref 39–117)
BUN/Creatinine Ratio: 14 (ref 10–24)
BUN: 15 mg/dL (ref 8–27)
Bilirubin Total: 1.1 mg/dL (ref 0.0–1.2)
CO2: 25 mmol/L (ref 20–29)
Calcium: 9.5 mg/dL (ref 8.6–10.2)
Chloride: 104 mmol/L (ref 96–106)
Creatinine, Ser: 1.08 mg/dL (ref 0.76–1.27)
GFR calc Af Amer: 85 mL/min/{1.73_m2} (ref >59)
GFR calc non Af Amer: 74 mL/min/{1.73_m2} (ref >59)
Globulin, Total: 1.8 g/dL (ref 1.5–4.5)
Glucose: 103 mg/dL — ABNORMAL HIGH (ref 65–99)
Potassium: 4 mmol/L (ref 3.5–5.2)
SODIUM: 141 mmol/L (ref 134–144)
Total Protein: 6.5 g/dL (ref 6.0–8.5)

## 2018-08-13 LAB — LIPID PANEL W/O CHOL/HDL RATIO
Cholesterol, Total: 210 mg/dL — ABNORMAL HIGH (ref 100–199)
HDL: 59 mg/dL (ref >39)
LDL Calculated: 129 mg/dL — ABNORMAL HIGH (ref 0–99)
TRIGLYCERIDES: 111 mg/dL (ref 0–149)
VLDL Cholesterol Cal: 22 mg/dL (ref 5–40)

## 2018-08-13 LAB — THYROID PANEL WITH TSH
Free Thyroxine Index: 1.6 (ref 1.2–4.9)
T3 Uptake Ratio: 26 % (ref 24–39)
T4, Total: 6.3 ug/dL (ref 4.5–12.0)
TSH: 2.08 u[IU]/mL (ref 0.450–4.500)

## 2018-11-11 ENCOUNTER — Ambulatory Visit: Payer: PRIVATE HEALTH INSURANCE | Admitting: Nurse Practitioner

## 2018-12-05 ENCOUNTER — Ambulatory Visit: Payer: Self-pay | Admitting: Nurse Practitioner

## 2018-12-05 NOTE — Telephone Encounter (Signed)
Pt. Reports he has seasonal allergies and woke up this morning with the right eye red. Outer aspect of sclera is red. No swelling or drainage from eye. No pain or itching.Will message through My Chart and send a picture. Please advise pt. Answer Assessment - Initial Assessment Questions 1. LOCATION: Location: "What's red, the eyeball or the outer eyelids?" (Note: when callers say the eye is red, they usually mean the sclera is red)       Red outer corner 2. REDNESS OF SCLERA: "Is the redness in one or both eyes?" "When did the redness start?"      One eye 3. ONSET: "When did the eye become red?" (e.g., hours, days)      This morning 4. EYELIDS: "Are the eyelids red or swollen?" If so, ask: "How much?"      Eyelids are fine 5. VISION: "Is there any difficulty seeing clearly?"      No 6. ITCHING: "Does it feel itchy?" If so ask: "How bad is it" (e.g., Scale 1-10; or mild, moderate, severe)     No 7. PAIN: "Is there any pain? If so, ask: "How bad is it?" (e.g., Scale 1-10; or mild, moderate, severe)     No 8. CONTACT LENS: "Do you wear contacts?"     No 9. CAUSE: "What do you think is causing the redness?"     Allergies 10. OTHER SYMPTOMS: "Do you have any other symptoms?" (e.g., fever, runny nose, cough, vomiting)       Cough  Protocols used: EYE - RED WITHOUT PUS-A-AH

## 2018-12-11 ENCOUNTER — Telehealth: Payer: Self-pay | Admitting: Nurse Practitioner

## 2018-12-11 ENCOUNTER — Other Ambulatory Visit: Payer: Self-pay | Admitting: Nurse Practitioner

## 2018-12-11 DIAGNOSIS — I10 Essential (primary) hypertension: Secondary | ICD-10-CM

## 2018-12-11 DIAGNOSIS — E782 Mixed hyperlipidemia: Secondary | ICD-10-CM

## 2018-12-11 DIAGNOSIS — R7301 Impaired fasting glucose: Secondary | ICD-10-CM

## 2018-12-11 NOTE — Progress Notes (Signed)
Future labs 

## 2018-12-11 NOTE — Telephone Encounter (Signed)
I have placed future lab orders.

## 2018-12-11 NOTE — Telephone Encounter (Signed)
Thank you :)

## 2018-12-11 NOTE — Telephone Encounter (Signed)
Called pt to reschedule Friday's visit he was concerned about not having labs done before visit. Rescheduled appt if you can put orders for labs in. Also advised him of message to for eyedrops due to eyes still being red.

## 2018-12-13 ENCOUNTER — Ambulatory Visit: Payer: PRIVATE HEALTH INSURANCE | Admitting: Nurse Practitioner

## 2018-12-23 ENCOUNTER — Other Ambulatory Visit: Payer: Self-pay

## 2018-12-23 ENCOUNTER — Other Ambulatory Visit: Payer: PRIVATE HEALTH INSURANCE

## 2018-12-23 DIAGNOSIS — R7301 Impaired fasting glucose: Secondary | ICD-10-CM

## 2018-12-23 DIAGNOSIS — E782 Mixed hyperlipidemia: Secondary | ICD-10-CM

## 2018-12-23 DIAGNOSIS — I10 Essential (primary) hypertension: Secondary | ICD-10-CM

## 2018-12-23 LAB — MICROALBUMIN, URINE WAIVED
Creatinine, Urine Waived: 200 mg/dL (ref 10–300)
Microalb, Ur Waived: 30 mg/L — ABNORMAL HIGH (ref 0–19)
Microalb/Creat Ratio: <30 mg/g (ref <30)

## 2018-12-23 LAB — LIPID PANEL PICCOLO, WAIVED
Chol/HDL Ratio Piccolo,Waive: 2.8 mg/dL
Cholesterol Piccolo, Waived: 198 mg/dL (ref <200)
HDL Chol Piccolo, Waived: 72 mg/dL (ref >59)
LDL Chol Calc Piccolo Waived: 103 mg/dL — ABNORMAL HIGH (ref <100)
Triglycerides Piccolo,Waived: 116 mg/dL (ref <150)
VLDL Chol Calc Piccolo,Waive: 23 mg/dL (ref <30)

## 2018-12-23 LAB — BAYER DCA HB A1C WAIVED: HB A1C (BAYER DCA - WAIVED): 5.4 % (ref <7.0)

## 2018-12-24 LAB — COMPREHENSIVE METABOLIC PANEL
ALT: 38 IU/L (ref 0–44)
AST: 29 IU/L (ref 0–40)
Albumin/Globulin Ratio: 2.3 — ABNORMAL HIGH (ref 1.2–2.2)
Albumin: 4.5 g/dL (ref 3.8–4.8)
Alkaline Phosphatase: 58 IU/L (ref 39–117)
BUN/Creatinine Ratio: 13 (ref 10–24)
BUN: 14 mg/dL (ref 8–27)
Bilirubin Total: 1.3 mg/dL — ABNORMAL HIGH (ref 0.0–1.2)
CO2: 26 mmol/L (ref 20–29)
Calcium: 9.6 mg/dL (ref 8.6–10.2)
Chloride: 100 mmol/L (ref 96–106)
Creatinine, Ser: 1.09 mg/dL (ref 0.76–1.27)
GFR calc Af Amer: 84 mL/min/{1.73_m2} (ref >59)
GFR calc non Af Amer: 72 mL/min/{1.73_m2} (ref >59)
Globulin, Total: 2 g/dL (ref 1.5–4.5)
Glucose: 120 mg/dL — ABNORMAL HIGH (ref 65–99)
Potassium: 3.6 mmol/L (ref 3.5–5.2)
Sodium: 141 mmol/L (ref 134–144)
Total Protein: 6.5 g/dL (ref 6.0–8.5)

## 2018-12-25 ENCOUNTER — Ambulatory Visit: Payer: PRIVATE HEALTH INSURANCE | Admitting: Nurse Practitioner

## 2018-12-27 ENCOUNTER — Observation Stay: Payer: PRIVATE HEALTH INSURANCE

## 2018-12-27 ENCOUNTER — Observation Stay
Admission: EM | Admit: 2018-12-27 | Discharge: 2018-12-27 | Disposition: A | Discharge status: Other Acute Inpatient Hospital | Payer: PRIVATE HEALTH INSURANCE | Attending: Internal Medicine | Admitting: Internal Medicine

## 2018-12-27 ENCOUNTER — Encounter: Payer: Self-pay | Admitting: *Deleted

## 2018-12-27 ENCOUNTER — Encounter
Admission: EM | Disposition: A | Discharge status: Other Acute Inpatient Hospital | Payer: Self-pay | Source: Home / Self Care | Attending: Emergency Medicine

## 2018-12-27 ENCOUNTER — Emergency Department: Payer: PRIVATE HEALTH INSURANCE

## 2018-12-27 ENCOUNTER — Observation Stay
Admit: 2018-12-27 | Discharge: 2018-12-27 | Disposition: A | Discharge status: Home / Self Care | Payer: PRIVATE HEALTH INSURANCE | Attending: Internal Medicine | Admitting: Internal Medicine

## 2018-12-27 ENCOUNTER — Ambulatory Visit: Payer: PRIVATE HEALTH INSURANCE | Admitting: Nurse Practitioner

## 2018-12-27 ENCOUNTER — Other Ambulatory Visit: Payer: Self-pay

## 2018-12-27 DIAGNOSIS — Z79899 Other long term (current) drug therapy: Secondary | ICD-10-CM | POA: Diagnosis not present

## 2018-12-27 DIAGNOSIS — E8881 Metabolic syndrome: Secondary | ICD-10-CM | POA: Insufficient documentation

## 2018-12-27 DIAGNOSIS — Z1159 Encounter for screening for other viral diseases: Secondary | ICD-10-CM | POA: Insufficient documentation

## 2018-12-27 DIAGNOSIS — I472 Ventricular tachycardia, unspecified: Secondary | ICD-10-CM

## 2018-12-27 DIAGNOSIS — Z888 Allergy status to other drugs, medicaments and biological substances status: Secondary | ICD-10-CM | POA: Insufficient documentation

## 2018-12-27 DIAGNOSIS — Z7951 Long term (current) use of inhaled steroids: Secondary | ICD-10-CM | POA: Insufficient documentation

## 2018-12-27 DIAGNOSIS — Z880 Allergy status to penicillin: Secondary | ICD-10-CM | POA: Diagnosis not present

## 2018-12-27 DIAGNOSIS — Z6841 Body Mass Index (BMI) 40.0 and over, adult: Secondary | ICD-10-CM | POA: Insufficient documentation

## 2018-12-27 DIAGNOSIS — J4550 Severe persistent asthma, uncomplicated: Secondary | ICD-10-CM | POA: Insufficient documentation

## 2018-12-27 DIAGNOSIS — E785 Hyperlipidemia, unspecified: Secondary | ICD-10-CM | POA: Diagnosis not present

## 2018-12-27 DIAGNOSIS — I4729 Other ventricular tachycardia: Secondary | ICD-10-CM

## 2018-12-27 DIAGNOSIS — I110 Hypertensive heart disease with heart failure: Secondary | ICD-10-CM | POA: Insufficient documentation

## 2018-12-27 DIAGNOSIS — I509 Heart failure, unspecified: Secondary | ICD-10-CM | POA: Insufficient documentation

## 2018-12-27 HISTORY — PX: LEFT HEART CATH AND CORONARY ANGIOGRAPHY: CATH118249

## 2018-12-27 LAB — CBC WITH DIFFERENTIAL/PLATELET
Abs Immature Granulocytes: 0.12 10*3/uL — ABNORMAL HIGH (ref 0.00–0.07)
Basophils Absolute: 0 10*3/uL (ref 0.0–0.1)
Basophils Relative: 0 %
Eosinophils Absolute: 0 10*3/uL (ref 0.0–0.5)
Eosinophils Relative: 0 %
HCT: 44.4 % (ref 39.0–52.0)
Hemoglobin: 16.1 g/dL (ref 13.0–17.0)
Immature Granulocytes: 1 %
Lymphocytes Relative: 5 %
Lymphs Abs: 0.7 10*3/uL (ref 0.7–4.0)
MCH: 36.2 pg — ABNORMAL HIGH (ref 26.0–34.0)
MCHC: 36.3 g/dL — ABNORMAL HIGH (ref 30.0–36.0)
MCV: 99.8 fL (ref 80.0–100.0)
Monocytes Absolute: 0.5 10*3/uL (ref 0.1–1.0)
Monocytes Relative: 3 %
Neutro Abs: 13.2 10*3/uL — ABNORMAL HIGH (ref 1.7–7.7)
Neutrophils Relative %: 91 %
Platelets: 143 10*3/uL — ABNORMAL LOW (ref 150–400)
RBC: 4.45 MIL/uL (ref 4.22–5.81)
RDW: 12.2 % (ref 11.5–15.5)
WBC: 14.5 10*3/uL — ABNORMAL HIGH (ref 4.0–10.5)
nRBC: 0 % (ref 0.0–0.2)

## 2018-12-27 LAB — BASIC METABOLIC PANEL
Anion gap: 13 (ref 5–15)
BUN: 33 mg/dL — ABNORMAL HIGH (ref 8–23)
CO2: 23 mmol/L (ref 22–32)
Calcium: 9.2 mg/dL (ref 8.9–10.3)
Chloride: 101 mmol/L (ref 98–111)
Creatinine, Ser: 2.7 mg/dL — ABNORMAL HIGH (ref 0.61–1.24)
GFR calc Af Amer: 28 mL/min — ABNORMAL LOW (ref >60)
GFR calc non Af Amer: 24 mL/min — ABNORMAL LOW (ref >60)
Glucose, Bld: 187 mg/dL — ABNORMAL HIGH (ref 70–99)
Potassium: 3.5 mmol/L (ref 3.5–5.1)
Sodium: 137 mmol/L (ref 135–145)

## 2018-12-27 LAB — HEPARIN LEVEL (UNFRACTIONATED): Heparin Unfractionated: 0.9 IU/mL — ABNORMAL HIGH (ref 0.30–0.70)

## 2018-12-27 LAB — COMPREHENSIVE METABOLIC PANEL
ALT: 76 U/L — ABNORMAL HIGH (ref 0–44)
AST: 95 U/L — ABNORMAL HIGH (ref 15–41)
Albumin: 4.1 g/dL (ref 3.5–5.0)
Alkaline Phosphatase: 46 U/L (ref 38–126)
Anion gap: 14 (ref 5–15)
BUN: 31 mg/dL — ABNORMAL HIGH (ref 8–23)
CO2: 23 mmol/L (ref 22–32)
Calcium: 9.2 mg/dL (ref 8.9–10.3)
Chloride: 99 mmol/L (ref 98–111)
Creatinine, Ser: 2.13 mg/dL — ABNORMAL HIGH (ref 0.61–1.24)
GFR calc Af Amer: 37 mL/min — ABNORMAL LOW (ref >60)
GFR calc non Af Amer: 32 mL/min — ABNORMAL LOW (ref >60)
Glucose, Bld: 179 mg/dL — ABNORMAL HIGH (ref 70–99)
Potassium: 3.6 mmol/L (ref 3.5–5.1)
Sodium: 136 mmol/L (ref 135–145)
Total Bilirubin: 1.5 mg/dL — ABNORMAL HIGH (ref 0.3–1.2)
Total Protein: 6.6 g/dL (ref 6.5–8.1)

## 2018-12-27 LAB — MRSA PCR SCREENING: MRSA by PCR: NEGATIVE

## 2018-12-27 LAB — TSH: TSH: 2.595 u[IU]/mL (ref 0.350–4.500)

## 2018-12-27 LAB — ECHOCARDIOGRAM COMPLETE
Height: 70 in
Weight: 5643.78 oz

## 2018-12-27 LAB — CBC
HCT: 47 % (ref 39.0–52.0)
Hemoglobin: 16.7 g/dL (ref 13.0–17.0)
MCH: 35.8 pg — ABNORMAL HIGH (ref 26.0–34.0)
MCHC: 35.5 g/dL (ref 30.0–36.0)
MCV: 100.6 fL — ABNORMAL HIGH (ref 80.0–100.0)
Platelets: 157 10*3/uL (ref 150–400)
RBC: 4.67 MIL/uL (ref 4.22–5.81)
RDW: 12.3 % (ref 11.5–15.5)
WBC: 17.5 10*3/uL — ABNORMAL HIGH (ref 4.0–10.5)
nRBC: 0 % (ref 0.0–0.2)

## 2018-12-27 LAB — BRAIN NATRIURETIC PEPTIDE: B Natriuretic Peptide: 437 pg/mL — ABNORMAL HIGH (ref 0.0–100.0)

## 2018-12-27 LAB — PHOSPHORUS: Phosphorus: 4.6 mg/dL (ref 2.5–4.6)

## 2018-12-27 LAB — LACTIC ACID, PLASMA
Lactic Acid, Venous: 2.5 mmol/L (ref 0.5–1.9)
Lactic Acid, Venous: 1.8 mmol/L (ref 0.5–1.9)

## 2018-12-27 LAB — TROPONIN I
Troponin I: 3.96 ng/mL (ref <0.03)
Troponin I: 5.71 ng/mL (ref <0.03)
Troponin I: 8.84 ng/mL (ref <0.03)

## 2018-12-27 LAB — GLUCOSE, CAPILLARY
Glucose-Capillary: 200 mg/dL — ABNORMAL HIGH (ref 70–99)
Glucose-Capillary: 137 mg/dL — ABNORMAL HIGH (ref 70–99)

## 2018-12-27 LAB — MAGNESIUM
Magnesium: 3 mg/dL — ABNORMAL HIGH (ref 1.7–2.4)
Magnesium: 2.4 mg/dL (ref 1.7–2.4)

## 2018-12-27 LAB — SARS CORONAVIRUS 2 BY RT PCR (HOSPITAL ORDER, PERFORMED IN ~~LOC~~ HOSPITAL LAB): SARS Coronavirus 2: NEGATIVE

## 2018-12-27 SURGERY — LEFT HEART CATH AND CORONARY ANGIOGRAPHY
Anesthesia: Moderate Sedation

## 2018-12-27 MED ORDER — SODIUM CHLORIDE 0.9 % WEIGHT BASED INFUSION: 1.0000 mL/kg/h | INTRAVENOUS | Status: DC

## 2018-12-27 MED ORDER — FUROSEMIDE 10 MG/ML IJ SOLN
INTRAMUSCULAR | Status: AC
  Filled 2018-12-27: qty 4

## 2018-12-27 MED ORDER — ACETAMINOPHEN 325 MG PO TABS: 650.0000 mg | ORAL_TABLET | Freq: Four times a day (QID) | ORAL | Status: DC | PRN

## 2018-12-27 MED ORDER — ALBUTEROL SULFATE (2.5 MG/3ML) 0.083% IN NEBU: 2.5000 mg | INHALATION_SOLUTION | Freq: Four times a day (QID) | RESPIRATORY_TRACT | Status: DC | PRN

## 2018-12-27 MED ORDER — LABETALOL HCL 5 MG/ML IV SOLN: 10.0000 mg | INTRAVENOUS | Status: DC | PRN

## 2018-12-27 MED ORDER — ONDANSETRON HCL 4 MG PO TABS: 4.0000 mg | ORAL_TABLET | Freq: Four times a day (QID) | ORAL | Status: DC | PRN

## 2018-12-27 MED ORDER — IPRATROPIUM-ALBUTEROL 0.5-2.5 (3) MG/3ML IN SOLN: 3.0000 mL | Freq: Four times a day (QID) | RESPIRATORY_TRACT | Status: DC

## 2018-12-27 MED ORDER — MONTELUKAST SODIUM 10 MG PO TABS: 10.0000 mg | ORAL_TABLET | Freq: Every day | ORAL | Status: DC

## 2018-12-27 MED ORDER — AMIODARONE HCL IN DEXTROSE 360-4.14 MG/200ML-% IV SOLN
60.0000 mg/h | INTRAVENOUS | Status: DC
  Administered 2018-12-27: 06:00:00 36 mg/h via INTRAVENOUS

## 2018-12-27 MED ORDER — MIDAZOLAM HCL 2 MG/2ML IJ SOLN
INTRAMUSCULAR | Status: AC
  Filled 2018-12-27: qty 2

## 2018-12-27 MED ORDER — HEPARIN BOLUS VIA INFUSION
4000.0000 [IU] | Freq: Once | INTRAVENOUS | Status: DC
  Filled 2018-12-27: qty 4000

## 2018-12-27 MED ORDER — AMIODARONE HCL IN DEXTROSE 360-4.14 MG/200ML-% IV SOLN: 60.0000 mg/h | INTRAVENOUS | Status: DC

## 2018-12-27 MED ORDER — ADENOSINE 12 MG/4ML IV SOLN
12.0000 mg | Freq: Once | INTRAVENOUS | Status: AC
  Administered 2018-12-27: 12 mg via INTRAVENOUS

## 2018-12-27 MED ORDER — FENTANYL CITRATE (PF) 100 MCG/2ML IJ SOLN
INTRAMUSCULAR | Status: DC | PRN
  Administered 2018-12-27: 25 ug via INTRAVENOUS

## 2018-12-27 MED ORDER — HEPARIN (PORCINE) 25000 UT/250ML-% IV SOLN: 12.0000 [IU]/kg/h | INTRAVENOUS | Status: DC

## 2018-12-27 MED ORDER — MIDAZOLAM HCL 2 MG/2ML IJ SOLN
INTRAMUSCULAR | Status: AC
  Administered 2018-12-27: 2 mg
  Filled 2018-12-27: qty 2

## 2018-12-27 MED ORDER — TIOTROPIUM BROMIDE MONOHYDRATE 18 MCG IN CAPS
18.0000 ug | ORAL_CAPSULE | Freq: Every day | RESPIRATORY_TRACT | Status: DC
  Administered 2018-12-27: 18 ug via RESPIRATORY_TRACT
  Filled 2018-12-27: qty 5

## 2018-12-27 MED ORDER — AMIODARONE IV BOLUS ONLY 150 MG/100ML
INTRAVENOUS | Status: AC
  Administered 2018-12-27: 05:00:00
  Filled 2018-12-27: qty 100

## 2018-12-27 MED ORDER — HYDRALAZINE HCL 20 MG/ML IJ SOLN: 10.0000 mg | INTRAMUSCULAR | Status: DC | PRN

## 2018-12-27 MED ORDER — LOSARTAN POTASSIUM 25 MG PO TABS
100.0000 mg | ORAL_TABLET | Freq: Every day | ORAL | Status: DC
  Administered 2018-12-27: 100 mg via ORAL
  Filled 2018-12-27: qty 2

## 2018-12-27 MED ORDER — SODIUM CHLORIDE 0.9 % WEIGHT BASED INFUSION: 3.0000 mL/kg/h | INTRAVENOUS | Status: DC

## 2018-12-27 MED ORDER — AMIODARONE HCL IN DEXTROSE 360-4.14 MG/200ML-% IV SOLN
INTRAVENOUS | Status: AC
  Administered 2018-12-27: 36 mg/h via INTRAVENOUS
  Filled 2018-12-27: qty 200

## 2018-12-27 MED ORDER — HEPARIN SODIUM (PORCINE) 1000 UNIT/ML IJ SOLN
INTRAMUSCULAR | Status: AC
  Filled 2018-12-27: qty 1

## 2018-12-27 MED ORDER — METHYLPREDNISOLONE SODIUM SUCC 125 MG IJ SOLR
INTRAMUSCULAR | Status: AC
  Administered 2018-12-27: 125 mg via INTRAVENOUS
  Filled 2018-12-27: qty 2

## 2018-12-27 MED ORDER — ONDANSETRON HCL 4 MG/2ML IJ SOLN: 4.0000 mg | Freq: Four times a day (QID) | INTRAMUSCULAR | Status: DC | PRN

## 2018-12-27 MED ORDER — HEPARIN (PORCINE) 25000 UT/250ML-% IV SOLN: 1450.0000 [IU]/h | INTRAVENOUS | Status: DC

## 2018-12-27 MED ORDER — FUROSEMIDE 10 MG/ML IJ SOLN
40.0000 mg | Freq: Once | INTRAMUSCULAR | Status: AC
  Administered 2018-12-27: 16:00:00 40 mg via INTRAVENOUS

## 2018-12-27 MED ORDER — SODIUM CHLORIDE 0.9% FLUSH
3.0000 mL | Freq: Two times a day (BID) | INTRAVENOUS | Status: DC
  Administered 2018-12-27: 14:00:00 3 mL via INTRAVENOUS

## 2018-12-27 MED ORDER — ENOXAPARIN SODIUM 40 MG/0.4ML ~~LOC~~ SOLN
40.0000 mg | Freq: Two times a day (BID) | SUBCUTANEOUS | Status: DC
  Filled 2018-12-27: qty 0.4

## 2018-12-27 MED ORDER — ASPIRIN 81 MG PO CHEW: 81.0000 mg | CHEWABLE_TABLET | ORAL | Status: DC

## 2018-12-27 MED ORDER — VERAPAMIL HCL 2.5 MG/ML IV SOLN
INTRAVENOUS | Status: AC
  Filled 2018-12-27: qty 2

## 2018-12-27 MED ORDER — VERAPAMIL HCL 2.5 MG/ML IV SOLN
INTRAVENOUS | Status: DC | PRN
  Administered 2018-12-27: 2.5 mg via INTRA_ARTERIAL

## 2018-12-27 MED ORDER — HEPARIN SODIUM (PORCINE) 1000 UNIT/ML IJ SOLN
INTRAMUSCULAR | Status: DC | PRN
  Administered 2018-12-27: 8000 [IU] via INTRAVENOUS

## 2018-12-27 MED ORDER — MEPOLIZUMAB 100 MG ~~LOC~~ SOLR: 100.0000 mg | SUBCUTANEOUS | Status: DC

## 2018-12-27 MED ORDER — ONDANSETRON HCL 4 MG/2ML IJ SOLN: 4.0000 mg | Freq: Four times a day (QID) | INTRAMUSCULAR | 0 refills | Status: DC | PRN

## 2018-12-27 MED ORDER — ACETAMINOPHEN 325 MG PO TABS: 650.0000 mg | ORAL_TABLET | ORAL | Status: DC | PRN

## 2018-12-27 MED ORDER — HEPARIN (PORCINE) IN NACL 1000-0.9 UT/500ML-% IV SOLN
INTRAVENOUS | Status: AC
  Filled 2018-12-27: qty 1000

## 2018-12-27 MED ORDER — METHYLPREDNISOLONE SODIUM SUCC 125 MG IJ SOLR
125.0000 mg | Freq: Once | INTRAMUSCULAR | Status: AC
  Administered 2018-12-27: 125 mg via INTRAVENOUS

## 2018-12-27 MED ORDER — AMIODARONE HCL IN DEXTROSE 360-4.14 MG/200ML-% IV SOLN: 30.0000 mg/h | INTRAVENOUS | Status: DC

## 2018-12-27 MED ORDER — SODIUM CHLORIDE 0.9% FLUSH: 3.0000 mL | INTRAVENOUS | Status: DC | PRN

## 2018-12-27 MED ORDER — INSULIN ASPART 100 UNIT/ML ~~LOC~~ SOLN: 0.0000 [IU] | Freq: Three times a day (TID) | SUBCUTANEOUS | Status: DC

## 2018-12-27 MED ORDER — PREDNISONE 20 MG PO TABS
50.0000 mg | ORAL_TABLET | Freq: Every day | ORAL | Status: DC
  Filled 2018-12-27: qty 1

## 2018-12-27 MED ORDER — SODIUM CHLORIDE 0.9 % IV SOLN: 250.0000 mL | INTRAVENOUS | Status: DC | PRN

## 2018-12-27 MED ORDER — AMIODARONE IV BOLUS ONLY 150 MG/100ML
150.0000 mg | Freq: Once | INTRAVENOUS | Status: AC
  Administered 2018-12-27: 150 mg via INTRAVENOUS
  Filled 2018-12-27 (×2): qty 100

## 2018-12-27 MED ORDER — AMIODARONE HCL IN DEXTROSE 360-4.14 MG/200ML-% IV SOLN
30.0000 mg/h | INTRAVENOUS | Status: DC
  Administered 2018-12-27: 30 mg/h via INTRAVENOUS
  Filled 2018-12-27 (×2): qty 200

## 2018-12-27 MED ORDER — ASPIRIN EC 81 MG PO TBEC
81.0000 mg | DELAYED_RELEASE_TABLET | Freq: Every day | ORAL | Status: DC
  Administered 2018-12-27: 81 mg via ORAL
  Filled 2018-12-27: qty 1

## 2018-12-27 MED ORDER — THEOPHYLLINE ER 400 MG PO TB24
400.0000 mg | ORAL_TABLET | Freq: Every day | ORAL | Status: DC
  Filled 2018-12-27: qty 1

## 2018-12-27 MED ORDER — IPRATROPIUM-ALBUTEROL 0.5-2.5 (3) MG/3ML IN SOLN
RESPIRATORY_TRACT | Status: AC
  Filled 2018-12-27: qty 3

## 2018-12-27 MED ORDER — VITAMIN C 500 MG PO TABS
500.0000 mg | ORAL_TABLET | Freq: Every day | ORAL | Status: DC
  Filled 2018-12-27: qty 1

## 2018-12-27 MED ORDER — MOMETASONE FURO-FORMOTEROL FUM 200-5 MCG/ACT IN AERO
2.0000 | INHALATION_SPRAY | Freq: Two times a day (BID) | RESPIRATORY_TRACT | Status: DC
  Administered 2018-12-27: 2 via RESPIRATORY_TRACT
  Filled 2018-12-27: qty 8.8

## 2018-12-27 MED ORDER — CYANOCOBALAMIN 500 MCG PO TABS
250.0000 ug | ORAL_TABLET | Freq: Every day | ORAL | Status: DC
  Filled 2018-12-27: qty 1

## 2018-12-27 MED ORDER — MAGNESIUM SULFATE 2 GM/50ML IV SOLN
2.0000 g | Freq: Once | INTRAVENOUS | Status: AC
  Administered 2018-12-27: 06:00:00 2 g via INTRAVENOUS

## 2018-12-27 MED ORDER — MIDAZOLAM HCL 2 MG/2ML IJ SOLN
INTRAMUSCULAR | Status: DC | PRN
  Administered 2018-12-27: 1 mg via INTRAVENOUS

## 2018-12-27 MED ORDER — HEPARIN (PORCINE) 25000 UT/250ML-% IV SOLN
1250.0000 [IU]/h | INTRAVENOUS | Status: DC
  Administered 2018-12-27 (×2): 1450 [IU]/h via INTRAVENOUS
  Filled 2018-12-27: qty 250

## 2018-12-27 MED ORDER — SODIUM CHLORIDE 0.9 % IV SOLN
INTRAVENOUS | Status: DC
  Administered 2018-12-27: 12:00:00 via INTRAVENOUS

## 2018-12-27 MED ORDER — IOHEXOL 300 MG/ML  SOLN
INTRAMUSCULAR | Status: DC | PRN
  Administered 2018-12-27: 60 mL via INTRAVENOUS

## 2018-12-27 MED ORDER — BUDESONIDE 0.5 MG/2ML IN SUSP
0.5000 mg | Freq: Two times a day (BID) | RESPIRATORY_TRACT | Status: DC
  Administered 2018-12-27 (×2): 0.5 mg via RESPIRATORY_TRACT
  Filled 2018-12-27 (×3): qty 2

## 2018-12-27 MED ORDER — INSULIN ASPART 100 UNIT/ML ~~LOC~~ SOLN: 0.0000 [IU] | Freq: Every day | SUBCUTANEOUS | Status: DC

## 2018-12-27 MED ORDER — FLUTICASONE PROPIONATE 50 MCG/ACT NA SUSP
2.0000 | Freq: Every day | NASAL | Status: DC
  Administered 2018-12-27: 10:00:00 2 via NASAL
  Filled 2018-12-27: qty 16

## 2018-12-27 MED ORDER — MIDAZOLAM HCL 2 MG/2ML IJ SOLN
INTRAMUSCULAR | Status: AC
  Administered 2018-12-27: 05:00:00 4 mg
  Filled 2018-12-27: qty 4

## 2018-12-27 MED ORDER — AMIODARONE HCL IN DEXTROSE 360-4.14 MG/200ML-% IV SOLN
60.0000 mg/h | INTRAVENOUS | Status: AC
  Administered 2018-12-27: 60 mg/h via INTRAVENOUS
  Filled 2018-12-27: qty 200

## 2018-12-27 MED ORDER — ACETAMINOPHEN 650 MG RE SUPP: 650.0000 mg | Freq: Four times a day (QID) | RECTAL | Status: DC | PRN

## 2018-12-27 MED ORDER — DOCUSATE SODIUM 100 MG PO CAPS
100.0000 mg | ORAL_CAPSULE | Freq: Two times a day (BID) | ORAL | Status: DC
  Filled 2018-12-27: qty 1

## 2018-12-27 MED ORDER — SODIUM CHLORIDE 0.9% FLUSH: 3.0000 mL | Freq: Two times a day (BID) | INTRAVENOUS | Status: DC

## 2018-12-27 MED ORDER — IPRATROPIUM-ALBUTEROL 0.5-2.5 (3) MG/3ML IN SOLN
3.0000 mL | Freq: Four times a day (QID) | RESPIRATORY_TRACT | Status: DC
  Administered 2018-12-27 (×2): 3 mL via RESPIRATORY_TRACT
  Filled 2018-12-27: qty 3

## 2018-12-27 MED ORDER — HEPARIN (PORCINE) IN NACL 1000-0.9 UT/500ML-% IV SOLN
INTRAVENOUS | Status: DC | PRN
  Administered 2018-12-27: 500 mL

## 2018-12-27 MED ORDER — HYDROCHLOROTHIAZIDE 25 MG PO TABS
25.0000 mg | ORAL_TABLET | Freq: Every day | ORAL | Status: DC
  Administered 2018-12-27: 25 mg via ORAL
  Filled 2018-12-27: qty 1

## 2018-12-27 MED ORDER — HEPARIN (PORCINE) 25000 UT/250ML-% IV SOLN: 1900.0000 [IU]/h | INTRAVENOUS | Status: DC

## 2018-12-27 MED ORDER — FENTANYL CITRATE (PF) 100 MCG/2ML IJ SOLN
INTRAMUSCULAR | Status: AC
  Filled 2018-12-27: qty 2

## 2018-12-27 MED ORDER — HEPARIN BOLUS VIA INFUSION
4000.0000 [IU] | Freq: Once | INTRAVENOUS | Status: AC
  Administered 2018-12-27: 12:00:00 4000 [IU] via INTRAVENOUS
  Filled 2018-12-27: qty 4000

## 2018-12-27 MED ORDER — VITAMIN E 180 MG (400 UNIT) PO CAPS
400.0000 [IU] | ORAL_CAPSULE | Freq: Every day | ORAL | Status: DC
  Filled 2018-12-27: qty 1

## 2018-12-27 MED ORDER — MAGNESIUM SULFATE 2 GM/50ML IV SOLN
2.0000 g | Freq: Once | INTRAVENOUS | Status: AC
  Administered 2018-12-27: 2 g via INTRAVENOUS
  Filled 2018-12-27: qty 50

## 2018-12-27 SURGICAL SUPPLY — 7 items
CATH INFINITI 5 FR JL3.5 (CATHETERS) ×2 IMPLANT
CATH INFINITI JR4 5F (CATHETERS) ×2 IMPLANT
DEVICE RAD COMP TR BAND LRG (VASCULAR PRODUCTS) ×2 IMPLANT
GLIDESHEATH SLEND SS 6F .021 (SHEATH) ×2 IMPLANT
KIT MANI 3VAL PERCEP (MISCELLANEOUS) ×3 IMPLANT
PACK CARDIAC CATH (CUSTOM PROCEDURE TRAY) ×3 IMPLANT
WIRE ROSEN-J .035X260CM (WIRE) ×2 IMPLANT

## 2018-12-27 NOTE — Consult Note (Signed)
Name: Thomas Calhoun MRN: [id number] DOB: 17-Sep-1955     CONSULTATION DATE: 12/27/2018  REFERRING MD :  Clayborn Bigness  CHIEF COMPLAINT:  Acute V tach    HISTORY OF PRESENT ILLNESS:  63 yo obese WM with acute V tach  Was seen in ER for increased SOB and DOE x 5 days Shoulder pain and heart racing  PER ER report The patient's wife urged him to call EMS and he was found to have a heart rate of 230.  He received adenosine x2 in route and adenosine 12 mg again upon arrival to the emergency department.  Telemetry read ventricular tachycardia.  The patient remained conscious yet had decreasing blood pressure which prompted the emergency department to attempt cardioversion.  Patient then received an amiodarone bolus with subsequent drip but converted his heart rate to sinus rhythm   Patient was transferred to ICU for recurrent v tach s/p cardioversion Patient alert and awake Feels bad No chest pain +nausea  PAST MEDICAL HISTORY :   has a past medical history of Abnormal serum enzyme level, Asthma, Hyperlipidemia, Hypertension, IFG (impaired fasting glucose), Meralgia paresthetica, and Testicular hypofunction.  has a past surgical history that includes Nasal sinus surgery; Appendectomy; Small intestine surgery; Colonoscopy with propofol (N/A, 07/18/2016); and Inner ear surgery (Left, 2018). Prior to Admission medications   Medication Sig Start Date End Date Taking? Authorizing Provider  albuterol (PROVENTIL HFA;VENTOLIN HFA) 108 (90 BASE) MCG/ACT inhaler Inhale 2 puffs into the lungs every 6 (six) hours as needed for wheezing or shortness of breath.   Yes [provider]  albuterol (PROVENTIL) (2.5 MG/3ML) 0.083% nebulizer solution Take 2.5 mg by nebulization every 6 (six) hours as needed for wheezing or shortness of breath.  03/09/16  Yes [provider]  augmented betamethasone dipropionate (DIPROLENE AF) 0.05 % cream Apply topically 2 (two) times daily. 04/03/17  Yes  Kathrine Haddock, NP  azelastine (ASTELIN) 0.1 % nasal spray USE 2 SPRAYS IN EACH NOSTRIL TWICE DAILY Patient taking differently: Place 2 sprays into both nostrils 2 (two) times daily.  07/08/18  Yes Crissman, Jeannette How, MD  budesonide (PULMICORT) 0.5 MG/2ML nebulizer solution 0.5 mg 2 (two) times daily. Add 1 vial to 240ml of saline in rinse bottle. Irrigate sinuses with 122ml through each nostril twice daily 07/12/15  Yes [provider]  fluticasone (FLONASE) 50 MCG/ACT nasal spray Place 2 sprays into both nostrils daily. 07/11/16  Yes Kathrine Haddock, NP  Fluticasone-Salmeterol (WIXELA INHUB) 500-50 MCG/DOSE AEPB Inhale 1 puff into the lungs 2 (two) times daily.   Yes [provider]  hydrochlorothiazide (HYDRODIURIL) 25 MG tablet Take 1 tablet (25 mg total) by mouth daily. 12/05/16  Yes Kathrine Haddock, NP  losartan (COZAAR) 100 MG tablet Take 1 tablet (100 mg total) by mouth daily. 08/12/18  Yes Cannady, Jolene T, NP  Mepolizumab (NUCALA) 100 MG SOLR Inject 100 mg into the skin every 30 (thirty) days.  08/08/18  Yes [provider]  montelukast (SINGULAIR) 10 MG tablet Take 10 mg by mouth at bedtime.  01/19/15  Yes [provider]  predniSONE (DELTASONE) 10 MG tablet Take 5 mg by mouth daily.  02/09/17  Yes [provider]  SPIRIVA HANDIHALER 18 MCG inhalation capsule Place into inhaler and inhale daily. 09/25/15  Yes [provider]  theophylline (UNIPHYL) 400 MG 24 hr tablet Take 400 mg by mouth daily.  12/03/17  Yes [provider]  vitamin B-12 (CYANOCOBALAMIN) 250 MCG tablet Take 250 mcg by mouth  daily.   Yes [provider]  vitamin C (ASCORBIC ACID) 500 MG tablet Take 500 mg by mouth daily.   Yes [provider]  vitamin E (VITAMIN E) 400 UNIT capsule Take 400 Units by mouth daily.   Yes [provider]   Allergies  Allergen Reactions  . Amlodipine Besylate Swelling  . Moxifloxacin Hives and Itching     Other reaction(s): Unknown  . Avelox [Moxifloxacin Hcl In Nacl] Hives and Itching  . Micardis [Telmisartan] Itching  . Penicillin V Potassium Rash  . Penicillins Rash    Other reaction(s): Unknown    FAMILY HISTORY:  family history includes ADD / ADHD in his son; Cancer in his father; Heart disease in his mother; Hypertension in his mother and son; Stroke in his father. SOCIAL HISTORY:  reports that he has never smoked. He has never used smokeless tobacco. He reports current alcohol use. He reports that he does not use drugs.    COVID-19 NEGATIVE: Acute COVID-19 infection ruled out by PCR.  BP 131/81   Pulse 73   Temp 98.4 F (36.9 C) (Oral)   Resp 18   Ht 5\' 10"  (1.778 m)   Wt (!) 158.8 kg   SpO2 99%   BMI 50.22 kg/m    CBC    Component Value Date/Time   WBC 17.5 (H) 12/27/2018 0526   RBC 4.67 12/27/2018 0526   HGB 16.7 12/27/2018 0526   HGB 16.3 01/04/2018 0945   HCT 47.0 12/27/2018 0526   HCT 47.8 01/04/2018 0945   PLT 157 12/27/2018 0526   PLT 156 01/04/2018 0945   MCV 100.6 (H) 12/27/2018 0526   MCV 104 (H) 01/04/2018 0945   MCV 97 06/08/2013 1606   MCH 35.8 (H) 12/27/2018 0526   MCHC 35.5 12/27/2018 0526   RDW 12.3 12/27/2018 0526   RDW 13.5 01/04/2018 0945   RDW 12.8 06/08/2013 1606   LYMPHSABS 1.1 01/04/2018 0945   EOSABS 0.5 (H) 01/04/2018 0945   BASOSABS 0.0 01/04/2018 0945   BMP Latest Ref Rng & Units 12/27/2018 12/23/2018 08/12/2018  Glucose 70 - 99 mg/dL 187(H) 120(H) 103(H)  BUN 8 - 23 mg/dL 33(H) 14 15  Creatinine 0.61 - 1.24 mg/dL 2.70(H) 1.09 1.08  BUN/Creat Ratio 10 - 24 - 13 14  Sodium 135 - 145 mmol/L 137 141 141  Potassium 3.5 - 5.1 mmol/L 3.5 3.6 4.0  Chloride 98 - 111 mmol/L 101 100 104  CO2 22 - 32 mmol/L 23 26 25   Calcium 8.9 - 10.3 mg/dL 9.2 9.6 9.5   Troponin 5.7  Review of Systems:  Gen:  +sweats,+ chills  HEENT: Denies blurred vision, double vision, ear pain, eye pain, hearing loss, nose bleeds, sore throat Cardiac:  No  dizziness, chest pain or heaviness, chest tightness,edema, No JVD Resp:   No cough, -sputum production, +shortness of breath,-wheezing, -hemoptysis,  Gi: Denies swallowing difficulty, stomach pain, nausea or vomiting, diarrhea, constipation, bowel incontinence Gu:  Denies bladder incontinence, burning urine Ext:   Denies Joint pain, stiffness or swelling Skin: Denies  skin rash, easy bruising or bleeding or hives Endoc:  Denies polyuria, polydipsia , polyphagia or weight change Psych:   Denies depression, insomnia or hallucinations  Other:  All other systems negative  VITAL SIGNS: Temp:  [98.4 F (36.9 C)] 98.4 F (36.9 C) (05/15 0520) Pulse Rate:  [29-235] 73 (05/15 0715) Resp:  [16-40] 18 (05/15 0715) BP: (88-146)/(54-116) 131/81 (05/15 0715) SpO2:  [92 %-100 %] 99 % (05/15 0715)  Weight:  [158.8 kg] 158.8 kg (05/15 0542)  Physical Examination:   GENERAL:critically ill appearing, obese HEAD: Normocephalic, atraumatic.  EYES: PERLA, EOMI No scleral icterus.  MOUTH: Moist mucosal membrane.  EAR, NOSE, THROAT: Clear without exudates. No external lesions.  NECK: Supple. No thyromegaly.  No JVD.  PULMONARY: CTA B/L +rhonchi CARDIOVASCULAR: S1 and S2. Regular rate and rhythm. No murmurs GASTROINTESTINAL: Soft, nontender, nondistended. Positive bowel sounds.  MUSCULOSKELETAL: No swelling, clubbing, or edema.  NEUROLOGIC: No gross focal neurological deficits. 5/5 strength all extremities SKIN: No ulceration, lesions, rashes, or cyanosis.  PSYCHIATRIC: Insight, judgment intact. -depression -anxiety ALL OTHER ROS ARE NEGATIVE        CULTURE RESULTS   Recent Results (from the past 240 hour(s))  SARS Coronavirus 2 (CEPHEID - Performed in [place] hospital lab), Hosp Order     Status: None   Collection Time: 12/27/18  5:51 AM  Result Value Ref Range Status   SARS Coronavirus 2 NEGATIVE NEGATIVE Final    Comment: (NOTE) If result is NEGATIVE SARS-CoV-2 target nucleic acids  are NOT DETECTED. The SARS-CoV-2 RNA is generally detectable in upper and lower  respiratory specimens during the acute phase of infection. The lowest  concentration of SARS-CoV-2 viral copies this assay can detect is 250  copies / mL. A negative result does not preclude SARS-CoV-2 infection  and should not be used as the sole basis for treatment or other  patient management decisions.  A negative result may occur with  improper specimen collection / handling, submission of specimen other  than nasopharyngeal swab, presence of viral mutation(s) within the  areas targeted by this assay, and inadequate number of viral copies  (<250 copies / mL). A negative result must be combined with clinical  observations, patient history, and epidemiological information. If result is POSITIVE SARS-CoV-2 target nucleic acids are DETECTED. The SARS-CoV-2 RNA is generally detectable in upper and lower  respiratory specimens dur ing the acute phase of infection.  Positive  results are indicative of active infection with SARS-CoV-2.  Clinical  correlation with patient history and other diagnostic information is  necessary to determine patient infection status.  Positive results do  not rule out bacterial infection or co-infection with other viruses. If result is PRESUMPTIVE POSTIVE SARS-CoV-2 nucleic acids MAY BE PRESENT.   A presumptive positive result was obtained on the submitted specimen  and confirmed on repeat testing.  While 2019 novel coronavirus  (SARS-CoV-2) nucleic acids may be present in the submitted sample  additional confirmatory testing may be necessary for epidemiological  and / or clinical management purposes  to differentiate between  SARS-CoV-2 and other Sarbecovirus currently known to infect humans.  If clinically indicated additional testing with an alternate test  methodology [phone]) is advised. The SARS-CoV-2 RNA is generally  detectable in upper and lower respiratory sp ecimens  during the acute  phase of infection. The expected result is Negative. Fact Sheet for Patients:  StrictlyIdeas.no Fact Sheet for Healthcare Providers: BankingDealers.co.za This test is not yet approved or cleared by the [place] FDA and has been authorized for detection and/or diagnosis of SARS-CoV-2 by FDA under an Emergency Use Authorization (EUA).  This EUA will remain in effect (meaning this test can be used) for the duration of the COVID-19 declaration under Section 564(b)(1) of the Act, 21 U.S.C. section 360bbb-3(b)(1), unless the authorization is terminated or revoked sooner. Performed at [facility], [address], [place], [place] [address]  IMAGING    Dg Chest Port 1 View  Result Date: 12/27/2018 CLINICAL DATA:  63 year old male with palpitations, tachycardia for 3 days. EXAM: PORTABLE CHEST 1 VIEW COMPARISON:  12/05/2016 chest radiographs and earlier. FINDINGS: Portable AP upright view at 0519 hours. Lower lung volumes. Pacer/resuscitation pads project over the chest. Mediastinal contours appear stable and within normal limits. Crowding of lung markings, but otherwise Allowing for portable technique the lungs are clear. No pneumothorax. Paucity bowel gas in the upper abdomen. No acute osseous abnormality identified. IMPRESSION: Low lung volumes with atelectasis.  Heart size within normal limits. Electronically Signed   By: Genevie Ann M.D.   On: 12/27/2018 06:07       ASSESSMENT AND PLAN SYNOPSIS  63 yo morbidly obese white male with OSA with reactive airways disease with acute criticall illness from acute V tach likely from acute NSTEMI   CARDIAC FAILURE- V Tach from NSTEMI -follow up cardiac enzymes as indicated Amiodarone infusion Heparin infusion Plan for cath some time today   SOB and Resp distress Get CXR Oxygen as needed High risk for intubation   ACUTE KIDNEY INJURY/Renal Failure  -follow chem 7 -follow UO -continue Foley Catheter-assess need    CARDIAC ICU monitoring  ID COVID-19 NEGATIVE: Acute COVID-19 infection ruled out by PCR.    GI GI PROPHYLAXIS as indicated  NUTRITIONAL STATUS DIET-->NPO Constipation protocol as indicated   ENDO - will use ICU hypoglycemic\Hyperglycemia protocol if needed    ELECTROLYTES -follow labs as needed -replace as needed -pharmacy consultation and following   DVT/GI PRX ordered TRANSFUSIONS AS NEEDED MONITOR FSBS ASSESS the need for LABS as needed   Critical Care Time devoted to patient care services described in this note is 45 minutes.   Overall, patient is critically ill, prognosis is guarded.  Patient with Multiorgan failure and at high risk for cardiac arrest and death.    Corrin Parker, M.D.  [facility]  Medical Director [place] Director [facility] Cardio-Pulmonary Department

## 2018-12-27 NOTE — Progress Notes (Signed)
This RN at bedside to pass AM medications when patient sitting on the side of the bed and stated he felt like he was having anxiety. Checked telemetry box and patient was in VT heart rate in 200's, patient A&Ox4, RRT called and patient placed in bed. Defib hooked up to patient. RRT arrived, MD [facility] arrived to bedside, verbal orders to shock patient at [place]. Patient was shocked and given 2mg  Versed, transferred to ICU. Bedside report given to accepting RN.

## 2018-12-27 NOTE — Progress Notes (Signed)
   12/27/18 1100  Clinical Encounter Type  Visited With Patient;Health care provider  Visit Type Initial  Referral From Nurse  Stress Factors  Patient Stress Factors Health changes  Ch called for RRT. Pt seemed to be in distress as the care team treated him. No spiritual needs at this time but giving him a space later to debrief the experience may be beneficial.

## 2018-12-27 NOTE — Progress Notes (Addendum)
ANTICOAGULATION CONSULT NOTE - Initial Consult  Pharmacy Consult for Heparin Drip Indication: chest pain/ACS  Allergies  Allergen Reactions  . Amlodipine Besylate Swelling  . Moxifloxacin Hives and Itching    Other reaction(s): Unknown  . Avelox [Moxifloxacin Hcl In Nacl] Hives and Itching  . Micardis [Telmisartan] Itching  . Penicillin V Potassium Rash  . Penicillins Rash    Other reaction(s): Unknown    Patient Measurements: Height: 5\' 10"  (177.8 cm) Weight: (!) 350 lb (158.8 kg) IBW/kg (Calculated) : 73 Heparin Dosing Weight: 111 kg  Vital Signs: Temp: 98.4 F (36.9 C) (05/15 0520) Temp Source: Oral (05/15 0520) BP: 131/81 (05/15 0715) Pulse Rate: 73 (05/15 0715)  Labs: Recent Labs    12/27/18 0526 12/27/18 0602 12/27/18 0936 12/27/18 1055  HGB 16.7  --   --  16.1  HCT 47.0  --   --  44.4  PLT 157  --   --  143*  CREATININE  --  2.70*  --   --   TROPONINI  --  3.96* 5.71*  --     Estimated Creatinine Clearance: 43.1 mL/min (A) (by C-G formula based on SCr of 2.7 mg/dL (H)).   Medical History: Past Medical History:  Diagnosis Date  . Abnormal serum enzyme level   . Asthma   . Hyperlipidemia   . Hypertension   . IFG (impaired fasting glucose)   . Meralgia paresthetica   . Testicular hypofunction    Assessment: Patient is a 63yo male admitted with Vtach and likely NSTEMI. Pharmacy consulted for Heparin dosing. Patient received Enoxaparin 40mg  SQ at ~10:00 this morning for DVT prophylaxis, this has been discontinued. Notified by RN that patient did not receive the dose of Enoxaparin,  Goal of Therapy:  Heparin level 0.3-0.7 units/ml Monitor platelets by anticoagulation protocol: Yes   Plan:  Start heparin infusion at 1450 units/hr Check anti-Xa level in 6 hours and daily while on heparin Continue to monitor H&H and platelets Order placed for Heparin 4000 units IV bolus.  Paulina Fusi, PharmD, BCPS 12/27/2018 11:09 AM

## 2018-12-27 NOTE — ED Provider Notes (Signed)
[facility] Emergency Department Provider Note   First MD Initiated Contact with Patient 12/27/18 [phone]     (approximate)  I have reviewed the triage vital signs and the nursing notes.   HISTORY  Chief Complaint Tachycardia   HPI Thomas Calhoun is a 63 y.o. male with below list of chronic medical conditions presents to the emergency department via EMS secondary to 3-day history of progressive dyspnea with minimal exertion rapid heartbeat and diaphoresis.  Patient states that for "a while" he has had worsening dyspnea with minimal exertion.  Patient's wife informing that if the patient walks from their bedroom to the kitchen which is not a far distance he is out of breath.  She also admits that he has had approximately 50 pound weight gain since February.  EMS noted the patient's heart rate was 230 on their arrival.  EMS was concerned about the possibility of SVT and as such 2 doses of adenosine were administered 6 and 12 mg respectively without any change.  On arrival to the emergency department patient's current heart rate is 242.  Patient given additional 12 mg of adenosine without any change     Past Medical History:  Diagnosis Date   Abnormal serum enzyme level    Asthma    Hyperlipidemia    Hypertension    IFG (impaired fasting glucose)    Meralgia paresthetica    Testicular hypofunction     Patient Active Problem List   Diagnosis Date Noted   Ventricular tachycardia ([place]) 12/27/2018   Elevated TSH 08/12/2018   GERD (gastroesophageal reflux disease) 01/04/2018   Benign neoplasm of cecum    Polyp of sigmoid colon    Benign neoplasm of transverse colon    Chronic otitis media of both ears 04/03/2016   Chronic pansinusitis 12/02/2015   Hypertension 05/04/2015   Hyperlipidemia 05/04/2015   Testicular hypofunction 05/04/2015   IFG (impaired fasting glucose) 05/04/2015   Meralgia paresthetica 05/04/2015   Severe obesity  (BMI >= 40) ([place]) 05/04/2015   Extrinsic allergic respiratory disease ([place]) 04/22/2015   Chronic rhinitis 06/15/2014   Nasal polyp 06/15/2014   Severe asthma 03/04/2014   Obstructive sleep apnea 03/04/2014    Past Surgical History:  Procedure Laterality Date   APPENDECTOMY     COLONOSCOPY WITH PROPOFOL N/A 07/18/2016   Procedure: COLONOSCOPY WITH PROPOFOL;  Surgeon: [name], MD;  Location: [facility] ENDOSCOPY;  Service: Endoscopy;  Laterality: N/A;   NASAL SINUS SURGERY     SMALL INTESTINE SURGERY      Prior to Admission medications   Medication Sig Start Date End Date Taking? Authorizing Provider  albuterol (PROVENTIL HFA;VENTOLIN HFA) 108 (90 BASE) MCG/ACT inhaler Inhale 2 puffs into the lungs every 6 (six) hours as needed for wheezing or shortness of breath.   Yes [provider]  albuterol (PROVENTIL) (2.5 MG/3ML) 0.083% nebulizer solution Take 2.5 mg by nebulization every 6 (six) hours as needed for wheezing or shortness of breath.  03/09/16  Yes [provider]  augmented betamethasone dipropionate (DIPROLENE AF) 0.05 % cream Apply topically 2 (two) times daily. 04/03/17  Yes Kathrine Haddock, NP  azelastine (ASTELIN) 0.1 % nasal spray USE 2 SPRAYS IN EACH NOSTRIL TWICE DAILY Patient taking differently: Place 2 sprays into both nostrils 2 (two) times daily.  07/08/18  Yes Crissman, Jeannette How, MD  budesonide (PULMICORT) 0.5 MG/2ML nebulizer solution 0.5 mg 2 (two) times daily. Add 1 vial to 214ml of saline in rinse bottle. Irrigate sinuses with  168ml through each nostril twice daily 07/12/15  Yes [provider]  fluticasone (FLONASE) 50 MCG/ACT nasal spray Place 2 sprays into both nostrils daily. 07/11/16  Yes Kathrine Haddock, NP  Fluticasone-Salmeterol (WIXELA INHUB) 500-50 MCG/DOSE AEPB Inhale 1 puff into the lungs 2 (two) times daily.   Yes [provider]  hydrochlorothiazide (HYDRODIURIL) 25 MG tablet Take 1 tablet (25 mg total) by mouth daily.  12/05/16  Yes Kathrine Haddock, NP  losartan (COZAAR) 100 MG tablet Take 1 tablet (100 mg total) by mouth daily. 08/12/18  Yes Cannady, Jolene T, NP  Mepolizumab (NUCALA) 100 MG SOLR Inject 100 mg into the skin every 30 (thirty) days.  08/08/18  Yes [provider]  montelukast (SINGULAIR) 10 MG tablet Take 10 mg by mouth at bedtime.  01/19/15  Yes [provider]  predniSONE (DELTASONE) 10 MG tablet Take 5 mg by mouth daily.  02/09/17  Yes [provider]  SPIRIVA HANDIHALER 18 MCG inhalation capsule Place into inhaler and inhale daily. 09/25/15  Yes [provider]  theophylline (UNIPHYL) 400 MG 24 hr tablet Take 400 mg by mouth daily.  12/03/17  Yes [provider]  vitamin B-12 (CYANOCOBALAMIN) 250 MCG tablet Take 250 mcg by mouth daily.   Yes [provider]  vitamin C (ASCORBIC ACID) 500 MG tablet Take 500 mg by mouth daily.   Yes [provider]  vitamin E (VITAMIN E) 400 UNIT capsule Take 400 Units by mouth daily.   Yes [provider]    Allergies Amlodipine besylate; Moxifloxacin; Avelox [moxifloxacin hcl in nacl]; Micardis [telmisartan]; Penicillin v potassium; and Penicillins  Family History  Problem Relation Age of Onset   Heart disease Mother    Hypertension Mother    Cancer Father        melanoma   Stroke Father        x2   Hypertension Son    ADD / ADHD Son     Social History Social History   Tobacco Use   Smoking status: Never Smoker   Smokeless tobacco: Never Used  Substance Use Topics   Alcohol use: No    Alcohol/week: 0.0 standard drinks   Drug use: No    Review of Systems Constitutional: No fever/chills Eyes: No visual changes. ENT: No sore throat. Cardiovascular: Denies chest pain.  Positive for rapid heartbeat Respiratory: Positive for shortness of breath. Gastrointestinal: No abdominal pain.  No nausea, no vomiting.  No diarrhea.  No constipation. Genitourinary: Negative for  dysuria. Musculoskeletal: Negative for neck pain.  Negative for back pain. Integumentary: Negative for rash. Neurological: Negative for headaches, focal weakness or numbness.   ____________________________________________   PHYSICAL EXAM:  VITAL SIGNS: ED Triage Vitals  Enc Vitals Group     BP 12/27/18 0515 (!) 146/116     Pulse Rate 12/27/18 0515 (!) 115     Resp 12/27/18 0520 (!) 26     Temp 12/27/18 0520 98.4 F (36.9 C)     Temp Source 12/27/18 0520 Oral     SpO2 12/27/18 0515 93 %     Weight 12/27/18 0542 (!) 158.8 kg (350 lb)     Height 12/27/18 0542 1.778 m (5\' 10" )     Head Circumference --      Peak Flow --      Pain Score 12/27/18 0535 0     Pain Loc --      Pain Edu? --      Excl. in [place]? --  Constitutional: Alert and oriented.  Ill-appearing Eyes: Conjunctivae are normal.  Nose: No congestion/rhinnorhea. Mouth/Throat: Mucous membranes are moist.  Oropharynx non-erythematous. Neck: No stridor.  Cardiovascular: Tachycardia regular rhythm. Good peripheral circulation. Grossly normal heart sounds. Respiratory: Normal respiratory effort.  No retractions. No audible wheezing. Gastrointestinal: Soft and nontender. No distention.  Musculoskeletal: 1+ bilateral lower extremity pitting edema Neurologic:  Normal speech and language. No gross focal neurologic deficits are appreciated.  Skin:  Skin is warm, dry and intact. No rash noted. Psychiatric: Mood and affect are normal. Speech and behavior are normal.  ____________________________________________   LABS (all labs ordered are listed, but only abnormal results are displayed)  Labs Reviewed  CBC - Abnormal; Notable for the following components:      Result Value   WBC 17.5 (*)    MCV 100.6 (*)    MCH 35.8 (*)    All other components within normal limits  BRAIN NATRIURETIC PEPTIDE - Abnormal; Notable for the following components:   B Natriuretic Peptide 437.0 (*)    All other components within normal  limits  BASIC METABOLIC PANEL - Abnormal; Notable for the following components:   Glucose, Bld 187 (*)    BUN 33 (*)    Creatinine, Ser 2.70 (*)    GFR calc non Af Amer 24 (*)    GFR calc Af Amer 28 (*)    All other components within normal limits  TROPONIN I - Abnormal; Notable for the following components:   Troponin I 3.96 (*)    All other components within normal limits  SARS CORONAVIRUS 2 (HOSPITAL ORDER, [facility])   ____________________________________________  EKG  ED ECG REPORT I, [place] Mayelin Panos, the attending physician, personally viewed and interpreted this ECG.   Date: 12/27/2018  EKG Time: 5:18 AM  Rate: 236  Rhythm: ventricular tachycardia  Axis: Normal  Intervals: Normal  ST&T Change: None  ____________________________________________  RADIOLOGY I,  Ernst Bowler, personally viewed and evaluated these images (plain radiographs) as part of my medical decision making, as well as reviewing the written report by the radiologist.  ED MD interpretation:    Official radiology report(s): Dg Chest Port 1 View  Result Date: 12/27/2018 CLINICAL DATA:  63 year old male with palpitations, tachycardia for 3 days. EXAM: PORTABLE CHEST 1 VIEW COMPARISON:  12/05/2016 chest radiographs and earlier. FINDINGS: Portable AP upright view at 0519 hours. Lower lung volumes. Pacer/resuscitation pads project over the chest. Mediastinal contours appear stable and within normal limits. Crowding of lung markings, but otherwise Allowing for portable technique the lungs are clear. No pneumothorax. Paucity bowel gas in the upper abdomen. No acute osseous abnormality identified. IMPRESSION: Low lung volumes with atelectasis.  Heart size within normal limits. Electronically Signed   By: Genevie Ann M.D.   On: 12/27/2018 06:07    ____________________________________________   PROCEDURES   Procedure(s) performed (including Critical Care):  .Critical  Care Performed by: Gregor Hams, MD Authorized by: Gregor Hams, MD   Critical care provider statement:    Critical care time (minutes):  90   Critical care time was exclusive of:  Separately billable procedures and treating other patients and teaching time   Critical care was necessary to treat or prevent imminent or life-threatening deterioration of the following conditions:  Circulatory failure and cardiac failure   Critical care was time spent personally by me on the following activities:  Development of treatment plan with patient or surrogate, discussions with consultants, evaluation of patient's  response to treatment, examination of patient, obtaining history from patient or surrogate, ordering and performing treatments and interventions, ordering and review of laboratory studies, ordering and review of radiographic studies, pulse oximetry, re-evaluation of patient's condition and review of old charts     ____________________________________________   INITIAL IMPRESSION / MDM / [place] / ED COURSE  As part of my medical decision making, I reviewed the following data within the electronic MEDICAL RECORD NUMBER   63 year old male presenting with above-stated history and physical exam with EKG consistent with ventricular tachycardia. patient given amiodarone 150 mg bolus followed by amiodarone infusion with resultant return to normal sinus rhythm with a normal rate currently of 73 blood pressure 131/81.  Patient's history and physical exam also concerning for about the possibility of congestive heart failure.  Patient discussed with Dr. Marcille Blanco for hospital admission further evaluation and management.  *Thomas Calhoun was evaluated in Emergency Department on 12/27/2018 for the symptoms described in the history of present illness. He was evaluated in the context of the global COVID-19 pandemic, which necessitated consideration that the patient might be at risk for  infection with the SARS-CoV-2 virus that causes COVID-19. Institutional protocols and algorithms that pertain to the evaluation of patients at risk for COVID-19 are in a state of rapid change based on information released by regulatory bodies including the CDC and federal and state organizations. These policies and algorithms were followed during the patient's care in the ED.*    ____________________________________________  FINAL CLINICAL IMPRESSION(S) / ED DIAGNOSES  Final diagnoses:  Ventricular tachycardia (HCC)  Elevated troponin   MEDICATIONS GIVEN DURING THIS VISIT:  Medications  midazolam (VERSED) 2 MG/2ML injection (4 mg  Given 12/27/18 0522)  amiodarone (NEXTERONE) 150-4.21 MG/100ML-% bolus (  Stopped 12/27/18 0525)  adenosine (ADENOCARD) 12 MG/4ML injection 12 mg (12 mg Intravenous Given 12/27/18 0518)  magnesium sulfate IVPB 2 g 50 mL (0 g Intravenous Stopped 12/27/18 0550)     ED Discharge Orders    None       Note:  This document was prepared using Dragon voice recognition software and may include unintentional dictation errors.   Gregor Hams, MD 12/27/18 [phone]

## 2018-12-27 NOTE — ED Notes (Signed)
No change in rate or rhythm with adenosine push. Dr Owens Shark remains at bedside. Pt notified that he will receive cardioversion.

## 2018-12-27 NOTE — H&P (Signed)
Thomas Calhoun is an 63 y.o. male.   Chief Complaint: Shortness of breath HPI: The patient with past medical history of hypertension, hyperlipidemia and metabolic syndrome presents to the emergency department complaining of shortness of breath.  The patient has been acutely short of breath for 3 days.  He admits to dyspnea and diaphoresis simply walking from room to room in his house.  He has been unable to walk to the mailbox.  Last night the patient reports that his shortness of breath was accompanied by pain in his shoulders bilaterally.  He denies chest pain per se but admits that he could feel his heart racing.  His symptoms have been unrelieved by CPAP at night.  The patient's wife urged him to call EMS and he was found to have a heart rate of 230.  He received adenosine x2 in route and adenosine 12 mg again upon arrival to the emergency department.  Telemetry read ventricular tachycardia.  The patient remained conscious yet had decreasing blood pressure which prompted the emergency department to attempt cardioversion.  Patient then received an amiodarone bolus with subsequent drip but converted his heart rate to sinus rhythm.  Once patient was stabilized emergency department staff call hospitalist service for admission.  Past Medical History:  Diagnosis Date  . Abnormal serum enzyme level   . Asthma   . Hyperlipidemia   . Hypertension   . IFG (impaired fasting glucose)   . Meralgia paresthetica   . Testicular hypofunction     Past Surgical History:  Procedure Laterality Date  . APPENDECTOMY    . COLONOSCOPY WITH PROPOFOL N/A 07/18/2016   Procedure: COLONOSCOPY WITH PROPOFOL;  Surgeon: [name], MD;  Location: [facility] ENDOSCOPY;  Service: Endoscopy;  Laterality: N/A;  . NASAL SINUS SURGERY    . SMALL INTESTINE SURGERY      Family History  Problem Relation Age of Onset  . Heart disease Mother   . Hypertension Mother   . Cancer Father        melanoma  . Stroke Father        x2   . Hypertension Son   . ADD / ADHD Son    Social History:  reports that he has never smoked. He has never used smokeless tobacco. He reports that he does not drink alcohol or use drugs.  Allergies:  Allergies  Allergen Reactions  . Amlodipine Besylate Swelling  . Moxifloxacin Hives and Itching    Other reaction(s): Unknown  . Avelox [Moxifloxacin Hcl In Nacl] Hives and Itching  . Micardis [Telmisartan] Itching  . Penicillin V Potassium Rash  . Penicillins Rash    Other reaction(s): Unknown    Prior to Admission medications   Medication Sig Start Date End Date Taking? Authorizing Provider  albuterol (PROVENTIL HFA;VENTOLIN HFA) 108 (90 BASE) MCG/ACT inhaler Inhale 2 puffs into the lungs every 6 (six) hours as needed for wheezing or shortness of breath.   Yes [provider]  albuterol (PROVENTIL) (2.5 MG/3ML) 0.083% nebulizer solution Take 2.5 mg by nebulization every 6 (six) hours as needed for wheezing or shortness of breath.  03/09/16  Yes [provider]  augmented betamethasone dipropionate (DIPROLENE AF) 0.05 % cream Apply topically 2 (two) times daily. 04/03/17  Yes Kathrine Haddock, NP  azelastine (ASTELIN) 0.1 % nasal spray USE 2 SPRAYS IN EACH NOSTRIL TWICE DAILY Patient taking differently: Place 2 sprays into both nostrils 2 (two) times daily.  07/08/18  Yes Crissman, Jeannette How, MD  budesonide (PULMICORT) 0.5  MG/2ML nebulizer solution 0.5 mg 2 (two) times daily. Add 1 vial to 230ml of saline in rinse bottle. Irrigate sinuses with 145ml through each nostril twice daily 07/12/15  Yes [provider]  fluticasone (FLONASE) 50 MCG/ACT nasal spray Place 2 sprays into both nostrils daily. 07/11/16  Yes Kathrine Haddock, NP  Fluticasone-Salmeterol (WIXELA INHUB) 500-50 MCG/DOSE AEPB Inhale 1 puff into the lungs 2 (two) times daily.   Yes [provider]  hydrochlorothiazide (HYDRODIURIL) 25 MG tablet Take 1 tablet (25 mg total) by mouth daily. 12/05/16  Yes  Kathrine Haddock, NP  losartan (COZAAR) 100 MG tablet Take 1 tablet (100 mg total) by mouth daily. 08/12/18  Yes Cannady, Jolene T, NP  Mepolizumab (NUCALA) 100 MG SOLR Inject 100 mg into the skin every 30 (thirty) days.  08/08/18  Yes [provider]  montelukast (SINGULAIR) 10 MG tablet Take 10 mg by mouth at bedtime.  01/19/15  Yes [provider]  predniSONE (DELTASONE) 10 MG tablet Take 5 mg by mouth daily.  02/09/17  Yes [provider]  SPIRIVA HANDIHALER 18 MCG inhalation capsule Place into inhaler and inhale daily. 09/25/15  Yes [provider]  theophylline (UNIPHYL) 400 MG 24 hr tablet Take 400 mg by mouth daily.  12/03/17  Yes [provider]  vitamin B-12 (CYANOCOBALAMIN) 250 MCG tablet Take 250 mcg by mouth daily.   Yes [provider]  vitamin C (ASCORBIC ACID) 500 MG tablet Take 500 mg by mouth daily.   Yes [provider]  vitamin E (VITAMIN E) 400 UNIT capsule Take 400 Units by mouth daily.   Yes [provider]     Results for orders placed or performed during the hospital encounter of 12/27/18 (from the past 48 hour(s))  CBC     Status: Abnormal   Collection Time: 12/27/18  5:26 AM  Result Value Ref Range   WBC 17.5 (H) 4.0 - 10.5 K/uL   RBC 4.67 4.22 - 5.81 MIL/uL   Hemoglobin 16.7 13.0 - 17.0 g/dL   HCT 47.0 39.0 - 52.0 %   MCV 100.6 (H) 80.0 - 100.0 fL   MCH 35.8 (H) 26.0 - 34.0 pg   MCHC 35.5 30.0 - 36.0 g/dL   RDW 12.3 11.5 - 15.5 %   Platelets 157 150 - 400 K/uL   nRBC 0.0 0.0 - 0.2 %    Comment: Performed at [facility], [address], [place], [place] [address]  Brain natriuretic peptide     Status: Abnormal   Collection Time: 12/27/18  5:26 AM  Result Value Ref Range   B Natriuretic Peptide 437.0 (H) 0.0 - 100.0 pg/mL    Comment: Performed at [facility], [address], [place], [place] [address]  SARS Coronavirus 2 (CEPHEID - Performed in [place] hospital lab),  Hosp Order     Status: None   Collection Time: 12/27/18  5:51 AM  Result Value Ref Range   SARS Coronavirus 2 NEGATIVE NEGATIVE    Comment: (NOTE) If result is NEGATIVE SARS-CoV-2 target nucleic acids are NOT DETECTED. The SARS-CoV-2 RNA is generally detectable in upper and lower  respiratory specimens during the acute phase of infection. The lowest  concentration of SARS-CoV-2 viral copies this assay can detect is 250  copies / mL. A negative result does not preclude SARS-CoV-2 infection  and should not be used as the sole basis for treatment or other  patient management decisions.  A negative result may occur with  improper specimen collection / handling, submission of specimen other  than nasopharyngeal swab, presence of viral mutation(s) within the  areas targeted by this assay, and inadequate number of viral copies  (<250 copies / mL). A negative result must be combined with clinical  observations, patient history, and epidemiological information. If result is POSITIVE SARS-CoV-2 target nucleic acids are DETECTED. The SARS-CoV-2 RNA is generally detectable in upper and lower  respiratory specimens dur ing the acute phase of infection.  Positive  results are indicative of active infection with SARS-CoV-2.  Clinical  correlation with patient history and other diagnostic information is  necessary to determine patient infection status.  Positive results do  not rule out bacterial infection or co-infection with other viruses. If result is PRESUMPTIVE POSTIVE SARS-CoV-2 nucleic acids MAY BE PRESENT.   A presumptive positive result was obtained on the submitted specimen  and confirmed on repeat testing.  While 2019 novel coronavirus  (SARS-CoV-2) nucleic acids may be present in the submitted sample  additional confirmatory testing may be necessary for epidemiological  and / or clinical management purposes  to differentiate between  SARS-CoV-2 and other Sarbecovirus currently known to  infect humans.  If clinically indicated additional testing with an alternate test  methodology [phone]) is advised. The SARS-CoV-2 RNA is generally  detectable in upper and lower respiratory sp ecimens during the acute  phase of infection. The expected result is Negative. Fact Sheet for Patients:  StrictlyIdeas.no Fact Sheet for Healthcare Providers: BankingDealers.co.za This test is not yet approved or cleared by the [place] FDA and has been authorized for detection and/or diagnosis of SARS-CoV-2 by FDA under an Emergency Use Authorization (EUA).  This EUA will remain in effect (meaning this test can be used) for the duration of the COVID-19 declaration under Section 564(b)(1) of the Act, 21 U.S.C. section 360bbb-3(b)(1), unless the authorization is terminated or revoked sooner. Performed at [facility], [address] [place], [place] [address]   Basic metabolic panel     Status: Abnormal   Collection Time: 12/27/18  6:02 AM  Result Value Ref Range   Sodium 137 135 - 145 mmol/L   Potassium 3.5 3.5 - 5.1 mmol/L   Chloride 101 98 - 111 mmol/L   CO2 23 22 - 32 mmol/L   Glucose, Bld 187 (H) 70 - 99 mg/dL   BUN 33 (H) 8 - 23 mg/dL   Creatinine, Ser 2.70 (H) 0.61 - 1.24 mg/dL   Calcium 9.2 8.9 - 10.3 mg/dL   GFR calc non Af Amer 24 (L) >60 mL/min   GFR calc Af Amer 28 (L) >60 mL/min   Anion gap 13 5 - 15    Comment: Performed at [facility], [address] [place], [place] [address]  Troponin I -     Status: Abnormal   Collection Time: 12/27/18  6:02 AM  Result Value Ref Range   Troponin I 3.96 (HH) <0.03 ng/mL    Comment: CRITICAL RESULT CALLED TO, READ BACK BY AND VERIFIED WITH GREG MOYER AT 4270 12/27/2018.PMF Performed at [facility], [address] [place], [place] [address]    Dg Chest [place] 1 View  Result Date: 12/27/2018 CLINICAL DATA:  63 year old male with palpitations,  tachycardia for 3 days. EXAM: PORTABLE CHEST 1 VIEW COMPARISON:  12/05/2016 chest radiographs and earlier. FINDINGS: Portable AP upright view at 0519 hours. Lower lung volumes. Pacer/resuscitation pads project over the chest. Mediastinal contours appear stable and within normal limits. Crowding of lung  markings, but otherwise Allowing for portable technique the lungs are clear. No pneumothorax. Paucity bowel gas in the upper abdomen. No acute osseous abnormality identified. IMPRESSION: Low lung volumes with atelectasis.  Heart size within normal limits. Electronically Signed   By: Genevie Ann M.D.   On: 12/27/2018 06:07    Review of Systems  Constitutional: Positive for diaphoresis. Negative for chills and fever.  HENT: Negative for sore throat and tinnitus.   Eyes: Negative for blurred vision and redness.  Respiratory: Positive for shortness of breath and wheezing. Negative for cough.   Cardiovascular: Negative for chest pain, palpitations, orthopnea and PND.  Gastrointestinal: Negative for abdominal pain, diarrhea, nausea and vomiting.  Genitourinary: Negative for dysuria, frequency and urgency.  Musculoskeletal: Negative for joint pain and myalgias.  Skin: Negative for rash.       No lesions  Neurological: Negative for speech change, focal weakness and weakness.  Endo/Heme/Allergies: Does not bruise/bleed easily.       No temperature intolerance  Psychiatric/Behavioral: Negative for depression and suicidal ideas.    Blood pressure 131/81, pulse 73, temperature 98.4 F (36.9 C), temperature source Oral, resp. rate 18, height 5\' 10"  (1.778 m), weight (!) 158.8 kg, SpO2 99 %. Physical Exam  Vitals reviewed. Constitutional: He is oriented to person, place, and time. He appears well-developed and well-nourished. No distress.  HENT:  Head: Normocephalic and atraumatic.  Mouth/Throat: Oropharynx is clear and moist.  Eyes: Pupils are equal, round, and reactive to light. Conjunctivae and EOM are  normal. No scleral icterus.  Neck: Normal range of motion. Neck supple. No JVD present. No tracheal deviation present. No thyromegaly present.  Cardiovascular: Normal rate, regular rhythm and normal heart sounds. Exam reveals no gallop and no friction rub.  No murmur heard. Respiratory: Effort normal. No respiratory distress. He has wheezes.  GI: Soft. Bowel sounds are normal. He exhibits no distension. There is no abdominal tenderness.  Genitourinary:    Genitourinary Comments: Deferred   Musculoskeletal: Normal range of motion.        General: Edema present.  Lymphadenopathy:    He has no cervical adenopathy.  Neurological: He is alert and oriented to person, place, and time. A cranial nerve deficit is present.  Skin: Skin is warm and dry. No rash noted. No erythema.  Psychiatric: He has a normal mood and affect. His behavior is normal. Judgment and thought content normal.     Assessment/Plan This is a 63 year old male admitted for ventricular tachycardia. 1.  Ventricular tachycardia: Stable; status post cardioversion.  Continue amiodarone for now.  Obtain echocardiogram due to persistent tachycardia.  Consult cardiology for recommendations regarding antiarrhythmic therapy. 2.  Asthma: Severe persistent.  The patient reports wheezing over the last week.  (He did 4 breathing treatments yesterday which may have contributed to his tachycardia; the patient is also on theophylline).  I have increased the patient's daily prednisone dose.  Continue albuterol every 4-6 hours as needed.  Also continue inhaled corticosteroid per home regimen. 3.  Hypertension: Controlled; continue hydrochlorothiazide and losartan 4.  Obesity: BMI is 50.2; encourage healthy diet and exercise 5.  DVT prophylaxis: Lovenox 6.  GI prophylaxis: None The patient is a full code.  Time spent on admission orders and patient care approximately 45 minutes  Harrie Foreman, MD 12/27/2018, 7:32 AM

## 2018-12-27 NOTE — OR Nursing (Signed)
Ns infusing at 125 since 1130 am. Bolus not started due to pt respiratory status labored on 2 liters. Dr Clayborn Bigness notified.

## 2018-12-27 NOTE — ED Notes (Signed)
Pt coughing and pt's HR dropped to 93 and remained

## 2018-12-27 NOTE — Progress Notes (Signed)
ANTICOAGULATION CONSULT NOTE - Initial Consult  Pharmacy Consult for Heparin Drip Indication: chest pain/ACS  Allergies  Allergen Reactions  . Amlodipine Besylate Swelling  . Moxifloxacin Hives and Itching    Other reaction(s): Unknown  . Avelox [Moxifloxacin Hcl In Nacl] Hives and Itching  . Micardis [Telmisartan] Itching  . Penicillin V Potassium Rash  . Penicillins Rash    Other reaction(s): Unknown    Patient Measurements: Height: 5\' 10"  (177.8 cm) Weight: (!) 352 lb 11.8 oz (160 kg) IBW/kg (Calculated) : 73 Heparin Dosing Weight: 111 kg  Vital Signs: Temp: 97.5 F (36.4 C) (05/15 1100) Temp Source: Oral (05/15 1100) BP: 129/78 (05/15 1730) Pulse Rate: 74 (05/15 1700)  Labs: Recent Labs    12/27/18 0526 12/27/18 0602 12/27/18 0936 12/27/18 1055 12/27/18 1346 12/27/18 1719  HGB 16.7  --   --  16.1  --   --   HCT 47.0  --   --  44.4  --   --   PLT 157  --   --  143*  --   --   HEPARINUNFRC  --   --   --   --   --  0.90*  CREATININE  --  2.70*  --  2.13*  --   --   TROPONINI  --  3.96* 5.71*  --  8.84*  --     Estimated Creatinine Clearance: 54.8 mL/min (A) (by C-G formula based on SCr of 2.13 mg/dL (H)).   Medical History: Past Medical History:  Diagnosis Date  . Abnormal serum enzyme level   . Asthma   . Hyperlipidemia   . Hypertension   . IFG (impaired fasting glucose)   . Meralgia paresthetica   . Testicular hypofunction    Assessment: Patient is a 63yo male admitted with Vtach and likely NSTEMI. Pharmacy consulted for Heparin dosing. Patient received Enoxaparin 40mg  SQ at ~10:00 this morning for DVT prophylaxis, this has been discontinued. Notified by RN that patient did not receive the dose of Enoxaparin,  Goal of Therapy:  Heparin level 0.3-0.7 units/ml Monitor platelets by anticoagulation protocol: Yes   Plan:  Start heparin infusion at 1450 units/hr Check anti-Xa level in 6 hours and daily while on heparin Continue to monitor H&H and  platelets Order placed for Heparin 4000 units IV bolus.   05/15:  HL @ 1719 = 0.9 Spoke with RN to inform her to decrease heparin dose.  RN stated heparin has been d/c'd by MD with no plans to restart.   Kelilah Hebard D 12/27/2018 5:50 PM

## 2018-12-27 NOTE — Discharge Summary (Signed)
Physician Discharge Summary  Patient ID: Thomas Calhoun MRN: [id number] DOB/AGE: 1956/02/15 63 y.o.  Admit date: 12/27/2018 Discharge date: 12/27/2018  Admission Diagnoses: V tach  Discharge Diagnoses:  Active Problems:   Ventricular tachycardia [facility])   Discharged Condition: Stable Hospital Course: admitted for V tach-s/p cardioversion Transferred to ICU for recurrent V tach  In ER he was found to have a heart rate of 230.  He received adenosine x2 in route and adenosine 12 mg again upon arrival to the emergency department.  Telemetry read ventricular tachycardia. The patient remained conscious yet had decreasing blood pressure which prompted the emergency department to attempt cardioversion.  Patient then received an amiodarone bolus with subsequent drip but converted his heart rate to sinus rhythm  Patient was admitted to floor, Rapid Responcee Team activated for recurrent V tach   Patient was transferred to ICU for recurrent v tach s/p cardioversion Cath completed no evidence of CAD-prelim report Cardiology Recommends Transfer to [facility] for EP evaluation.   Consults: Cardiology  Significant Diagnostic Studies:  Cardiac CATH-no significant CAD  Treatments:  1. Electrical cardioversion 2.amiodarone infusion 3.heparin infusion 4.ICU monitoring  Discharge Exam: Blood pressure 124/86, pulse 75, temperature (!) 97.5 F (36.4 C), temperature source Oral, resp. rate 17, height 5\' 10"  (1.778 m), weight (!) 160 kg, SpO2 94 %.  Physical Examination:   GENERAL:NAD, no fevers, chills, no weakness no fatigue HEAD: Normocephalic, atraumatic.  EYES: PERLA, EOMI No scleral icterus.  MOUTH: Moist mucosal membrane.  EAR, NOSE, THROAT: Clear without exudates. No external lesions.  NECK: Supple. No thyromegaly.  No JVD.  PULMONARY: CTA B/L no wheezing, rhonchi, crackles CARDIOVASCULAR: S1 and S2. Regular rate and rhythm. No murmurs GASTROINTESTINAL: Soft, nontender,  nondistended. Positive bowel sounds.  MUSCULOSKELETAL: No swelling, clubbing, or edema.  NEUROLOGIC: No gross focal neurological deficits. 5/5 strength all extremities SKIN: No ulceration, lesions, rashes, or cyanosis.  PSYCHIATRIC: Insight, judgment intact. -depression -anxiety ALL OTHER ROS ARE NEGATIVE     Disposition: [facility]   DISCHARGE MEDS AMIODARONE INFUSION  HEPARIN INFUSION   Signed: Flora Lipps 12/27/2018, 4:01 PM

## 2018-12-27 NOTE — ED Notes (Signed)
Pt's HR came down to 91 then went back to 220s

## 2018-12-27 NOTE — ED Notes (Signed)
This RN and Dr Owens Shark spoke with wife and gave her  Updates on pt status and plan of care.

## 2018-12-27 NOTE — Consult Note (Signed)
Date: 12/27/2018                  Patient Name:  Thomas Calhoun  MRN: [id number]  DOB: [birth year]  Age / Sex: 63 y.o., male         PCP: [name], NP                 Service Requesting Consult: IM/ Epifanio Lesches, MD                 Reason for Consult: ARF            History of Present Illness: Patient is a 63 y.o. male with medical problems of Asthma, HTN, OSA, who was admitted to [facility] on 12/27/2018 for evaluation of left shoulder pain, cold sweats, vomiting, Brought to hospital via EMS for tachycardia. Received iv adenosine and Underwentcardioversion for unstable tachycardia in the ER Episodes of hypotension noted. S Creatinine trends noted below Results for Thomas Calhoun" (MRN [id number]) as of 12/27/2018 11:41  Ref. Range 12/23/2018 11:36 12/27/2018 06:02 12/27/2018 10:55  Creatinine Latest Ref Range: 0.61 - 1.24 mg/dL 1.09 2.70 (H) 2.13 (H)   Also noted to have increased troponin  Medications: Outpatient medications: Medications Prior to Admission  Medication Sig Dispense Refill Last Dose  . albuterol (PROVENTIL HFA;VENTOLIN HFA) 108 (90 BASE) MCG/ACT inhaler Inhale 2 puffs into the lungs every 6 (six) hours as needed for wheezing or shortness of breath.   prn at prn  . albuterol (PROVENTIL) (2.5 MG/3ML) 0.083% nebulizer solution Take 2.5 mg by nebulization every 6 (six) hours as needed for wheezing or shortness of breath.    prn at prn  . augmented betamethasone dipropionate (DIPROLENE AF) 0.05 % cream Apply topically 2 (two) times daily. 30 g 0 12/26/2018 at Unknown time  . azelastine (ASTELIN) 0.1 % nasal spray USE 2 SPRAYS IN EACH NOSTRIL TWICE DAILY (Patient taking differently: Place 2 sprays into both nostrils 2 (two) times daily. ) 30 mL 5 12/26/2018 at Unknown time  . budesonide (PULMICORT) 0.5 MG/2ML nebulizer solution 0.5 mg 2 (two) times daily. Add 1 vial to 259ml of saline in rinse bottle. Irrigate sinuses with 153ml through each nostril twice  daily   12/26/2018 at Unknown time  . fluticasone (FLONASE) 50 MCG/ACT nasal spray Place 2 sprays into both nostrils daily. 16 g 12 12/26/2018 at Unknown time  . Fluticasone-Salmeterol (WIXELA INHUB) 500-50 MCG/DOSE AEPB Inhale 1 puff into the lungs 2 (two) times daily.   12/26/2018 at Unknown time  . hydrochlorothiazide (HYDRODIURIL) 25 MG tablet Take 1 tablet (25 mg total) by mouth daily. 90 tablet 0 Past Week at Unknown time  . losartan (COZAAR) 100 MG tablet Take 1 tablet (100 mg total) by mouth daily. 90 tablet 3 12/26/2018 at Unknown time  . Mepolizumab (NUCALA) 100 MG SOLR Inject 100 mg into the skin every 30 (thirty) days.    Past Month at Unknown time  . montelukast (SINGULAIR) 10 MG tablet Take 10 mg by mouth at bedtime.    12/26/2018 at Unknown time  . predniSONE (DELTASONE) 10 MG tablet Take 5 mg by mouth daily.    12/26/2018 at Unknown time  . SPIRIVA HANDIHALER 18 MCG inhalation capsule Place into inhaler and inhale daily.   12/26/2018 at Unknown time  . theophylline (UNIPHYL) 400 MG 24 hr tablet Take 400 mg by mouth daily.   11 12/26/2018 at Unknown time  . vitamin B-12 (CYANOCOBALAMIN) 250 MCG tablet Take  250 mcg by mouth daily.   12/26/2018 at Unknown time  . vitamin C (ASCORBIC ACID) 500 MG tablet Take 500 mg by mouth daily.   12/26/2018 at Unknown time  . vitamin E (VITAMIN E) 400 UNIT capsule Take 400 Units by mouth daily.   12/26/2018 at Unknown time    Current medications: Current Facility-Administered Medications  Medication Dose Route Frequency Provider Last Rate Last Dose  . acetaminophen (TYLENOL) tablet 650 mg  650 mg Oral Q6H PRN Harrie Foreman, MD       Or  . acetaminophen (TYLENOL) suppository 650 mg  650 mg Rectal Q6H PRN Harrie Foreman, MD      . albuterol (PROVENTIL) (2.5 MG/3ML) 0.083% nebulizer solution 2.5 mg  2.5 mg Nebulization Q6H PRN Harrie Foreman, MD      . aspirin EC tablet 81 mg  81 mg Oral Daily Harrie Foreman, MD   81 mg at 12/27/18 1013  .  budesonide (PULMICORT) nebulizer solution 0.5 mg  0.5 mg Nebulization BID Harrie Foreman, MD   0.5 mg at 12/27/18 1013  . docusate sodium (COLACE) capsule 100 mg  100 mg Oral BID Harrie Foreman, MD   100 mg at 12/27/18 1013  . fluticasone (FLONASE) 50 MCG/ACT nasal spray 2 spray  2 spray Each Nare Daily Harrie Foreman, MD   2 spray at 12/27/18 1016  . heparin ADULT infusion 100 units/mL (25000 units/221mL sodium chloride 0.45%)  1,450 Units/hr Intravenous Continuous Vira Blanco, RPH 14.5 mL/hr at 12/27/18 1125 1,450 Units/hr at 12/27/18 1125  . magnesium sulfate IVPB 2 g 50 mL  2 g Intravenous Once Callwood, Dwayne D, MD 50 mL/hr at 12/27/18 1058 2 g at 12/27/18 1058  . Mepolizumab SOLR 100 mg  100 mg Subcutaneous Q30 days Harrie Foreman, MD      . mometasone-formoterol [facility]) 200-5 MCG/ACT inhaler 2 puff  2 puff Inhalation BID Harrie Foreman, MD   2 puff at 12/27/18 1015  . montelukast (SINGULAIR) tablet 10 mg  10 mg Oral QHS Harrie Foreman, MD      . ondansetron [facility]) tablet 4 mg  4 mg Oral Q6H PRN Harrie Foreman, MD       Or  . ondansetron [facility]) injection 4 mg  4 mg Intravenous Q6H PRN Harrie Foreman, MD      . predniSONE (DELTASONE) tablet 50 mg  50 mg Oral Q breakfast Harrie Foreman, MD   50 mg at 12/27/18 1013  . theophylline (UNIPHYL) 400 MG 24 hr tablet 400 mg  400 mg Oral Daily Harrie Foreman, MD   400 mg at 12/27/18 1015  . tiotropium (SPIRIVA) inhalation capsule ([facility] use ONLY) 18 mcg  18 mcg Inhalation Daily Harrie Foreman, MD   18 mcg at 12/27/18 1015  . vitamin B-12 (CYANOCOBALAMIN) tablet 250 mcg  250 mcg Oral Daily Harrie Foreman, MD   250 mcg at 12/27/18 1015  . vitamin C (ASCORBIC ACID) tablet 500 mg  500 mg Oral Daily Harrie Foreman, MD   500 mg at 12/27/18 1014  . vitamin E capsule 400 Units  400 Units Oral Daily Harrie Foreman, MD   400 Units at 12/27/18 1014      Allergies: Allergies  Allergen Reactions  .  Amlodipine Besylate Swelling  . Moxifloxacin Hives and Itching    Other reaction(s): Unknown  . Avelox [Moxifloxacin Hcl In Nacl] Hives and Itching  . Micardis [  Telmisartan] Itching  . Penicillin V Potassium Rash  . Penicillins Rash    Other reaction(s): Unknown      Past Medical History: Past Medical History:  Diagnosis Date  . Abnormal serum enzyme level   . Asthma   . Hyperlipidemia   . Hypertension   . IFG (impaired fasting glucose)   . Meralgia paresthetica   . Testicular hypofunction      Past Surgical History: Past Surgical History:  Procedure Laterality Date  . APPENDECTOMY    . COLONOSCOPY WITH PROPOFOL N/A 07/18/2016   Procedure: COLONOSCOPY WITH PROPOFOL;  Surgeon: [name], MD;  Location: [facility] ENDOSCOPY;  Service: Endoscopy;  Laterality: N/A;  . INNER EAR SURGERY Left 2018   repaird ear infection in the ear drum  . NASAL SINUS SURGERY    . SMALL INTESTINE SURGERY       Family History: Family History  Problem Relation Age of Onset  . Heart disease Mother   . Hypertension Mother   . Cancer Father        melanoma  . Stroke Father        x2  . Hypertension Son   . ADD / ADHD Son      Social History: Social History   Socioeconomic History  . Marital status: Married    Spouse name: Not on file  . Number of children: Not on file  . Years of education: Not on file  . Highest education level: Not on file  Occupational History  . Not on file  Social Needs  . Financial resource strain: Not hard at all  . Food insecurity:    Worry: Not on file    Inability: Not on file  . Transportation needs:    Medical: No    Non-medical: No  Tobacco Use  . Smoking status: Never Smoker  . Smokeless tobacco: Never Used  Substance and Sexual Activity  . Alcohol use: Yes    Alcohol/week: 0.0 standard drinks    Comment: occasional  . Drug use: No  . Sexual activity: Never  Lifestyle  . Physical activity:    Days per week: Not on file    Minutes per  session: Not on file  . Stress: Not at all  Relationships  . Social connections:    Talks on phone: Not on file    Gets together: Not on file    Attends religious service: Not on file    Active member of club or organization: Not on file    Attends meetings of clubs or organizations: Not on file    Relationship status: Not on file  . Intimate partner violence:    Fear of current or ex partner: Not on file    Emotionally abused: Not on file    Physically abused: Not on file    Forced sexual activity: Not on file  Other Topics Concern  . Not on file  Social History Narrative  . Not on file     Review of Systems: Gen: No fevers or chills, has gained some weight recently HEENT: No vision or hearing problems reported CV: Chest pain and shortness of breath as described Resp: Shortness of breath, no cough or sputum GI: Appetite has been good, no problems with diarrhea, nausea or vomiting GU : No problems reported with voiding MS: Reports right knee pain otherwise ambulatory Derm:    No complaints Psych: No complaints Heme: No complaints Neuro: No complaints Endocrine.  No complaints  Vital Signs: Blood pressure  130/83, pulse 74, temperature (!) 97.5 F (36.4 C), temperature source Oral, resp. rate (!) 22, height 5\' 10"  (1.778 m), weight (!) 158.8 kg, SpO2 97 %.   Intake/Output Summary (Last 24 hours) at 12/27/2018 1136 Last data filed at 12/27/2018 1033 Gross per 24 hour  Intake 805 ml  Output 350 ml  Net 455 ml    Weight trends: [employer]   12/27/18 0542  Weight: (!) 158.8 kg     Physical Exam: General:  Morbidly obese gentleman, laying in the bed   HEENT  anicteric, moist oral mucous membranes  Neck:  Supple, no masses  Lungs:  Normal breathing effort, clear to auscultation  Heart::  No rub  Abdomen:  Obese, soft, nontender  Extremities:  1+ pitting edema bilaterally  Neurologic:  Alert, oriented  Skin:  No acute rashes    Lab results: Basic Metabolic  Panel: Recent Labs  Lab 12/23/18 1136 12/27/18 0602 12/27/18 1055  NA 141 137 136  K 3.6 3.5 3.6  CL 100 101 99  CO2 26 23 23   GLUCOSE 120* 187* 179*  BUN 14 33* 31*  CREATININE 1.09 2.70* 2.13*  CALCIUM 9.6 9.2 9.2  MG  --  3.0* 2.4  PHOS  --   --  4.6    Liver Function Tests: Recent Labs  Lab 12/27/18 1055  AST 95*  ALT 76*  ALKPHOS 46  BILITOT 1.5*  PROT 6.6  ALBUMIN 4.1   No results for input(s): LIPASE, AMYLASE in the last 168 hours. No results for input(s): AMMONIA in the last 168 hours.  CBC: Recent Labs  Lab 12/27/18 0526 12/27/18 1055  WBC 17.5* 14.5*  NEUTROABS  --  13.2*  HGB 16.7 16.1  HCT 47.0 44.4  MCV 100.6* 99.8  PLT 157 143*    Cardiac Enzymes: Recent Labs  Lab 12/27/18 0936  TROPONINI 5.71*    BNP: Invalid input(s): POCBNP  CBG: Recent Labs  Lab 12/27/18 1106  GLUCAP 200*    Microbiology: Recent Results (from the past 720 hour(s))  SARS Coronavirus 2 (CEPHEID - Performed in [place] hospital lab), Hosp Order     Status: None   Collection Time: 12/27/18  5:51 AM  Result Value Ref Range Status   SARS Coronavirus 2 NEGATIVE NEGATIVE Final    Comment: (NOTE) If result is NEGATIVE SARS-CoV-2 target nucleic acids are NOT DETECTED. The SARS-CoV-2 RNA is generally detectable in upper and lower  respiratory specimens during the acute phase of infection. The lowest  concentration of SARS-CoV-2 viral copies this assay can detect is 250  copies / mL. A negative result does not preclude SARS-CoV-2 infection  and should not be used as the sole basis for treatment or other  patient management decisions.  A negative result may occur with  improper specimen collection / handling, submission of specimen other  than nasopharyngeal swab, presence of viral mutation(s) within the  areas targeted by this assay, and inadequate number of viral copies  (<250 copies / mL). A negative result must be combined with clinical  observations,  patient history, and epidemiological information. If result is POSITIVE SARS-CoV-2 target nucleic acids are DETECTED. The SARS-CoV-2 RNA is generally detectable in upper and lower  respiratory specimens dur ing the acute phase of infection.  Positive  results are indicative of active infection with SARS-CoV-2.  Clinical  correlation with patient history and other diagnostic information is  necessary to determine patient infection status.  Positive results do  not rule out  bacterial infection or co-infection with other viruses. If result is PRESUMPTIVE POSTIVE SARS-CoV-2 nucleic acids MAY BE PRESENT.   A presumptive positive result was obtained on the submitted specimen  and confirmed on repeat testing.  While 2019 novel coronavirus  (SARS-CoV-2) nucleic acids may be present in the submitted sample  additional confirmatory testing may be necessary for epidemiological  and / or clinical management purposes  to differentiate between  SARS-CoV-2 and other Sarbecovirus currently known to infect humans.  If clinically indicated additional testing with an alternate test  methodology [phone]) is advised. The SARS-CoV-2 RNA is generally  detectable in upper and lower respiratory sp ecimens during the acute  phase of infection. The expected result is Negative. Fact Sheet for Patients:  StrictlyIdeas.no Fact Sheet for Healthcare Providers: BankingDealers.co.za This test is not yet approved or cleared by the [place] FDA and has been authorized for detection and/or diagnosis of SARS-CoV-2 by FDA under an Emergency Use Authorization (EUA).  This EUA will remain in effect (meaning this test can be used) for the duration of the COVID-19 declaration under Section 564(b)(1) of the Act, 21 U.S.C. section 360bbb-3(b)(1), unless the authorization is terminated or revoked sooner. Performed at [facility], [address]  [place], [place] [address]      Coagulation Studies: No results for input(s): LABPROT, INR in the last 72 hours.  Urinalysis: No results for input(s): COLORURINE, LABSPEC, PHURINE, GLUCOSEU, HGBUR, BILIRUBINUR, KETONESUR, PROTEINUR, UROBILINOGEN, NITRITE, LEUKOCYTESUR in the last 72 hours.  Invalid input(s): APPERANCEUR      Imaging: Dg Chest Port 1 View  Result Date: 12/27/2018 CLINICAL DATA:  Ventricular tachycardia. MI. Increased blood pressure. EXAM: PORTABLE CHEST 1 VIEW COMPARISON:  12/27/2018 at 0519 hours FINDINGS: Pads and telemetry leads overlie the chest. The cardiomediastinal silhouette is unchanged. The lungs remain hypoinflated with similar appearance of bibasilar atelectasis. No pulmonary edema, sizable pleural effusion, or pneumothorax is identified. IMPRESSION: Low lung volumes with bibasilar atelectasis. Electronically Signed   By: Logan Bores M.D.   On: 12/27/2018 11:23   Dg Chest Port 1 View  Result Date: 12/27/2018 CLINICAL DATA:  63 year old male with palpitations, tachycardia for 3 days. EXAM: PORTABLE CHEST 1 VIEW COMPARISON:  12/05/2016 chest radiographs and earlier. FINDINGS: Portable AP upright view at 0519 hours. Lower lung volumes. Pacer/resuscitation pads project over the chest. Mediastinal contours appear stable and within normal limits. Crowding of lung markings, but otherwise Allowing for portable technique the lungs are clear. No pneumothorax. Paucity bowel gas in the upper abdomen. No acute osseous abnormality identified. IMPRESSION: Low lung volumes with atelectasis.  Heart size within normal limits. Electronically Signed   By: Genevie Ann M.D.   On: 12/27/2018 06:07      Assessment & Plan: Pt is a 63 y.o.   male withmedical problems of Asthma, HTN, OSA, , was admitted on 12/27/2018 with unstable tachycardia requiring cardioversion and shock.  Hospital course complicated by NSTEMI and AKI  1. AKI 2. NSTEMI 3.  Cardiac arrhythmias, recurrent V.  tach  Patient's baseline creatinine is 1.09/GFR 72 from Dec 23, 2018 Admission creatinine elevated at 2.70 Acute kidney injury likely secondary to ATN/possibly renal hypoperfusion due to arrhythmias Serum creatinine improved slightly today to 2.13 Cardiac catheterization is planned today.  Discussed risk of renal injury from IV contrast exposure with the patient. Given shortness of breath, cardiac instability, will give gentle IV hydration for prevention of IV contrast nephropathy      LOS: 0 Shemuel Harkleroad 5/15/202011:36 AM  [place], [place]  Note: This note was prepared with Dragon dictation. Any transcription errors are unintentional

## 2018-12-27 NOTE — Progress Notes (Signed)
Anticoagulation monitoring(Lovenox):  63yo  male ordered Lovenox 40 mg Q24h  Filed Weights   12/27/18 0542  Weight: (!) 350 lb (158.8 kg)   BMI 50.22   Lab Results  Component Value Date   CREATININE 2.70 (H) 12/27/2018   CREATININE 1.09 12/23/2018   CREATININE 1.08 08/12/2018   Estimated Creatinine Clearance: 43.1 mL/min (A) (by C-G formula based on SCr of 2.7 mg/dL (H)). Hemoglobin & Hematocrit     Component Value Date/Time   HGB 16.7 12/27/2018 0526   HGB 16.3 01/04/2018 0945   HCT 47.0 12/27/2018 0526   HCT 47.8 01/04/2018 0945     Per Protocol for Patient with estCrcl > 30 ml/min and BMI > 40, will transition to Lovenox 40 mg Q12h.

## 2018-12-27 NOTE — ED Notes (Signed)
Bedside report given to Se[place], RN

## 2018-12-27 NOTE — ED Notes (Signed)
Pt was given 6mg , 6mg  and 12mg  of adenosine PTA with no change in rate or rhythm.

## 2018-12-27 NOTE — ED Notes (Signed)
Pt was shaved and placed onto pads. Pt's HR was remaining in the 230s at this time.

## 2018-12-27 NOTE — Progress Notes (Signed)
Inpatient Diabetes Program Recommendations  AACE/ADA: New Consensus Statement on Inpatient Glycemic Control (2015)  Target Ranges:  Prepandial:   less than 140 mg/dL      Peak postprandial:   less than 180 mg/dL (1-2 hours)      Critically ill patients:  140 - 180 mg/dL   Lab Results  Component Value Date   GLUCAP 200 (H) 12/27/2018   HGBA1C 5.4 12/23/2018    Review of Glycemic Control  Diabetes history: None noted- Impaired fasting Glucose Outpatient Diabetes medications: None Current orders for Inpatient glycemic control:  Prednisone 50 mg daily  Inpatient Diabetes Program Recommendations:   Consider adding Novolog sensitive correction tid with meals and HS while patient is on Prednisone in the hospital.   Thanks,  Adah Perl, RN, BC-ADM Inpatient Diabetes Coordinator Pager [phone] (8a-5p)

## 2018-12-27 NOTE — OR Nursing (Signed)
Discussed with Dr Clayborn Bigness for fluid clarification. Since pt has been on NS at 125 since 1157 ok to stop all fluid.

## 2018-12-27 NOTE — ED Notes (Signed)
Pt was shocked with 100J with no changes in his rhythm.

## 2018-12-27 NOTE — ED Triage Notes (Signed)
Pt brought in via EMS because of 2 days of left shoulder pain. Pt started with cold sweats and then vomiting today. Pt awake and alert on arrival HR 239.

## 2018-12-27 NOTE — ED Notes (Signed)
Receiving nurse unavailable to take report at this time, will try again

## 2018-12-27 NOTE — Progress Notes (Addendum)
[place] [facility] [place] NAME: Thomas Calhoun    MR#:  [id number]  DATE OF BIRTH:  1956-07-07  SUBJECTIVE: Admitted this morning for V. tach, post cardioversion, started amiodarone drip, admitted to telemetry.  On telemetry patient developed V. tach again with heart rate of  200 bpm, cardioverted with 200 J, transferred to ICU, he is in sinus rhythm with 80 bpm, spoke with cardiology Dr. Clayborn Bigness recommended discontinue Lovenox and starting heparin, watch him in ICU closely.  Patient denies any chest pain.  No shortness of breath now.  According to H&P patient has been feeling short of breath for 3 days.  CHIEF COMPLAINT:   Chief Complaint  Patient presents with  . Tachycardia    REVIEW OF SYSTEMS:   ROS CONSTITUTIONAL: Generalized fatigue, generalized weakness EYES: No blurred or double vision.  EARS, NOSE, AND THROAT: No tinnitus or ear pain.  RESPIRATORY: Hartness of breath for last 2 days, palpitations .  CARDIOVASCULAR: No chest pain, orthopnea, edema.  GASTROINTESTINAL: No nausea, vomiting, diarrhea or abdominal pain.  GENITOURINARY: No dysuria, hematuria.  ENDOCRINE: No polyuria, nocturia,  HEMATOLOGY: No anemia, easy bruising or bleeding SKIN: No rash or lesion. MUSCULOSKELETAL: No joint pain or arthritis.   NEUROLOGIC: No tingling, numbness, weakness.  PSYCHIATRY: No anxiety or depression.   DRUG ALLERGIES:   Allergies  Allergen Reactions  . Amlodipine Besylate Swelling  . Moxifloxacin Hives and Itching    Other reaction(s): Unknown  . Avelox [Moxifloxacin Hcl In Nacl] Hives and Itching  . Micardis [Telmisartan] Itching  . Penicillin V Potassium Rash  . Penicillins Rash    Other reaction(s): Unknown    VITALS:  Blood pressure 131/81, pulse 73, temperature 98.4 F (36.9 C), temperature source Oral, resp. rate 18, height 5\' 10"  (1.778 m), weight (!) 158.8 kg, SpO2 99 %.  PHYSICAL EXAMINATION:  GENERAL:  63  y.o.-year-old patient lying in the bed with no acute distress.  EYES: Pupils equal, round, reactive to light and accommodation. No scleral icterus. Extraocular muscles intact.  HEENT: Head atraumatic, normocephalic. Oropharynx and nasopharynx clear.  NECK:  Supple, no jugular venous distention. No thyroid enlargement, no tenderness.  LUNGS: Normal breath sounds bilaterally, no wheezing, rales,rhonchi or crepitation. No use of accessory muscles of respiration.  CARDIOVASCULAR: S1, S2 normal. No murmurs, rubs, or gallops.  ABDOMEN: Soft, nontender, nondistended. Bowel sounds present. No organomegaly or mass.  EXTREMITIES: No pedal edema, cyanosis, or clubbing.  NEUROLOGIC: Cranial nerves II through XII are intact. Muscle strength 5/5 in all extremities. Sensation intact. Gait not checked.  PSYCHIATRIC: The patient is alert and oriented x 3.  SKIN: No obvious rash, lesion, or ulcer.    LABORATORY PANEL:   CBC Recent Labs  Lab 12/27/18 0526  WBC 17.5*  HGB 16.7  HCT 47.0  PLT 157   ------------------------------------------------------------------------------------------------------------------  Chemistries  Recent Labs  Lab 12/23/18 1136 12/27/18 0602  NA 141 137  K 3.6 3.5  CL 100 101  CO2 26 23  GLUCOSE 120* 187*  BUN 14 33*  CREATININE 1.09 2.70*  CALCIUM 9.6 9.2  MG  --  3.0*  AST 29  --   ALT 38  --   ALKPHOS 58  --   BILITOT 1.3*  --    ------------------------------------------------------------------------------------------------------------------  Cardiac Enzymes Recent Labs  Lab 12/27/18 0936  TROPONINI 5.71*   ------------------------------------------------------------------------------------------------------------------  RADIOLOGY:  Dg Chest Port 1 View  Result Date: 12/27/2018 CLINICAL DATA:  63 year old male with palpitations,  tachycardia for 3 days. EXAM: PORTABLE CHEST 1 VIEW COMPARISON:  12/05/2016 chest radiographs and earlier. FINDINGS:  Portable AP upright view at 0519 hours. Lower lung volumes. Pacer/resuscitation pads project over the chest. Mediastinal contours appear stable and within normal limits. Crowding of lung markings, but otherwise Allowing for portable technique the lungs are clear. No pneumothorax. Paucity bowel gas in the upper abdomen. No acute osseous abnormality identified. IMPRESSION: Low lung volumes with atelectasis.  Heart size within normal limits. Electronically Signed   By: Genevie Ann M.D.   On: 12/27/2018 06:07    EKG:   Orders placed or performed during the hospital encounter of 12/27/18  . ED EKG  . ED EKG  . EKG 12-Lead  . EKG 12-Lead  . EKG 12-Lead  . EKG 12-Lead  . EKG 12-Lead  . EKG 12-Lead    ASSESSMENT AND PLAN:   Ventricular tachycardia, recurrent V. tach, status post cardioversion, patient had a cardioversion in the emergency room and also on telemetry, rapid response was called few minutes ago because of recurrent V. tach with heart rate up to 200 bpm, cardioverted with 200 J, Dr. Silverio Lay was at bedside at that time, patient immediately transferred to ICU, he is now in sinus rhythm, says he is feeling better, continue amiodarone drip, bolus dose was ordered, 2 g of magnesium also is ordered, cardiologist Dr. Everlene Balls recommends discontinuing Lovenox and starting heparin drip. 2.  History of severe persistent asthma, continue theophylline, albuterol, inhaled steroids as per home regimen, according to patient patient had to use over nebulizers yesterday contributed to tachycardia as well, 3.  Essential hypertension uncontrolled.  #4 elevated troponins likely due to cardioversion,/NSTEMI; spoke with Dr. Becky Sax mentioned that  unable to do cardiac cath because of renal insufficiency with creatinine up to 2.7,  5.  Acute renal failure, likely ATN [; hold HCTZ, losartan. High risk for cardiac arrest, condition critical.  All the records are reviewed and case discussed with Care  Management/Social Workerr. Management plans discussed with the patient, family and they are in agreement.  CODE STATUS: Full code  TOTAL TIME TAKING CARE OF THIS PATIENT: 38 minutes.  (Critical care)   critical to plan discharge disposition today.  Epifanio Lesches M.D on 12/27/2018 at 10:49 AM  Between 7am to 6pm - Pager - [phone]  After 6pm go to [email or web address] - password [email or web address]  Office  [phone]  CC: Primary care physician; [name], NP   Note: This dictation was prepared with Dragon dictation along with smaller phrase technology. Any transcriptional errors that result from this process are unintentional.

## 2018-12-27 NOTE — ED Notes (Signed)
Pt continues to rest. Pt denies any pain at this time. No distress currently.

## 2018-12-27 NOTE — Progress Notes (Signed)
Spoke to patient's wife Mannie Ohlin , and explained to her that patient develop recurrent V. tach and he is now in intensive care unit.  She told me that for the last 2 nights he was feeling clammy, sweaty, said his shoulders were hurting.  He also had episode of diarrhea.  She also mentioned that he has been having shortness of breath for a long time and he does not do much of any activity and has been sedentary for long time.

## 2018-12-27 NOTE — OR Nursing (Signed)
Audible respiratory wheezes post cath. 40 mg IV Lasix ordered and administered, Duoneb aerosol treatment given.

## 2018-12-27 NOTE — ED Notes (Signed)
receiving RN unable to receive report at this time, will try again

## 2018-12-27 NOTE — ED Notes (Signed)
Pt states he has felt this way for several days. Pt reports coughing up phlegm tonight. Pt reports swelling in the left lower leg a few days ago. Pt has discoloration to the anterior legs proximal to the ankles.

## 2018-12-27 NOTE — Progress Notes (Signed)
[facility] transport on unit to transfer patient at this time. Requested bag of Amiodarone for transport.

## 2018-12-27 NOTE — Progress Notes (Signed)
*  PRELIMINARY RESULTS* Echocardiogram 2D Echocardiogram has been performed.  Thomas Calhoun 12/27/2018, 1:55 PM

## 2018-12-27 NOTE — ED Notes (Signed)
ED TO INPATIENT HANDOFF REPORT  ED Nurse Name and Phone #: [place]   S Name/Age/Gender Thomas Calhoun 63 y.o. male Room/Bed: ED15A/ED15A  Code Status   Code Status: Not on file  Home/SNF/Other Home Patient oriented to: self, place, time and situation Is this baseline? Yes   Triage Complete: Triage complete  Chief Complaint Chest Pain  Triage Note Pt brought in via EMS because of 2 days of left shoulder pain. Pt started with cold sweats and then vomiting today. Pt awake and alert on arrival HR 239.   Allergies Allergies  Allergen Reactions  . Amlodipine Besylate Swelling  . Moxifloxacin Hives and Itching    Other reaction(s): Unknown  . Avelox [Moxifloxacin Hcl In Nacl] Hives and Itching  . Micardis [Telmisartan] Itching  . Penicillin V Potassium Rash  . Penicillins Rash    Other reaction(s): Unknown    Level of Care/Admitting Diagnosis ED Disposition    ED Disposition Condition [facility]: [place] [100120]  Level of Care: Telemetry [5]  Covid Evaluation: Screening Protocol (No Symptoms)  Diagnosis: Ventricular tachycardia [facility]) [[id number]]  Admitting Physician: Harrie Foreman [[id number]]  Attending Physician: Harrie Foreman [phone]  PT Class (Do Not Modify): Observation [104]  PT Acc Code (Do Not Modify): Observation [10022]       B Medical/Surgery History Past Medical History:  Diagnosis Date  . Abnormal serum enzyme level   . Asthma   . Hyperlipidemia   . Hypertension   . IFG (impaired fasting glucose)   . Meralgia paresthetica   . Testicular hypofunction    Past Surgical History:  Procedure Laterality Date  . APPENDECTOMY    . COLONOSCOPY WITH PROPOFOL N/A 07/18/2016   Procedure: COLONOSCOPY WITH PROPOFOL;  Surgeon: [name], MD;  Location: [facility] ENDOSCOPY;  Service: Endoscopy;  Laterality: N/A;  . NASAL SINUS SURGERY    . SMALL INTESTINE SURGERY       A IV  Location/Drains/Wounds Patient Lines/Drains/Airways Status   Active Line/Drains/Airways    Name:   Placement date:   Placement time:   Site:   Days:   Peripheral IV 12/27/18 Left Antecubital   12/27/18    0510    Antecubital   less than 1   Peripheral IV 12/27/18 Right Hand   12/27/18    0525    Hand   less than 1   Peripheral IV 12/27/18 Right Antecubital   12/27/18    0602    Antecubital   less than 1          Intake/Output Last 24 hours  Intake/Output Summary (Last 24 hours) at 12/27/2018 0719 Last data filed at 12/27/2018 0550 Gross per 24 hour  Intake 805 ml  Output -  Net 805 ml    Labs/Imaging Results for orders placed or performed during the hospital encounter of 12/27/18 (from the past 48 hour(s))  CBC     Status: Abnormal   Collection Time: 12/27/18  5:26 AM  Result Value Ref Range   WBC 17.5 (H) 4.0 - 10.5 K/uL   RBC 4.67 4.22 - 5.81 MIL/uL   Hemoglobin 16.7 13.0 - 17.0 g/dL   HCT 47.0 39.0 - 52.0 %   MCV 100.6 (H) 80.0 - 100.0 fL   MCH 35.8 (H) 26.0 - 34.0 pg   MCHC 35.5 30.0 - 36.0 g/dL   RDW 12.3 11.5 - 15.5 %   Platelets 157 150 - 400 K/uL   nRBC 0.0 0.0 -  0.2 %    Comment: Performed at [facility], [address] [place], [place] [address]  Brain natriuretic peptide     Status: Abnormal   Collection Time: 12/27/18  5:26 AM  Result Value Ref Range   B Natriuretic Peptide 437.0 (H) 0.0 - 100.0 pg/mL    Comment: Performed at [facility], [address] [place], [place] [address]  Basic metabolic panel     Status: Abnormal   Collection Time: 12/27/18  6:02 AM  Result Value Ref Range   Sodium 137 135 - 145 mmol/L   Potassium 3.5 3.5 - 5.1 mmol/L   Chloride 101 98 - 111 mmol/L   CO2 23 22 - 32 mmol/L   Glucose, Bld 187 (H) 70 - 99 mg/dL   BUN 33 (H) 8 - 23 mg/dL   Creatinine, Ser 2.70 (H) 0.61 - 1.24 mg/dL   Calcium 9.2 8.9 - 10.3 mg/dL   GFR calc non Af Amer 24 (L) >60 mL/min   GFR calc Af Amer 28 (L) >60 mL/min   Anion gap 13 5  - 15    Comment: Performed at [facility], [address] [place], [place] [address]  Troponin I -     Status: Abnormal   Collection Time: 12/27/18  6:02 AM  Result Value Ref Range   Troponin I 3.96 (HH) <0.03 ng/mL    Comment: CRITICAL RESULT CALLED TO, READ BACK BY AND VERIFIED WITH GREG MOYER AT 2993 12/27/2018.PMF Performed at [facility], [address], [place], [place] [address]    Dg Chest [place] 1 View  Result Date: 12/27/2018 CLINICAL DATA:  63 year old male with palpitations, tachycardia for 3 days. EXAM: PORTABLE CHEST 1 VIEW COMPARISON:  12/05/2016 chest radiographs and earlier. FINDINGS: Portable AP upright view at 0519 hours. Lower lung volumes. Pacer/resuscitation pads project over the chest. Mediastinal contours appear stable and within normal limits. Crowding of lung markings, but otherwise Allowing for portable technique the lungs are clear. No pneumothorax. Paucity bowel gas in the upper abdomen. No acute osseous abnormality identified. IMPRESSION: Low lung volumes with atelectasis.  Heart size within normal limits. Electronically Signed   By: Genevie Ann M.D.   On: 12/27/2018 06:07    Pending Labs Unresulted Labs (From admission, onward)    Start     Ordered   12/27/18 0550  SARS Coronavirus 2 (CEPHEID - Performed in [place] hospital lab), Hosp Order  (Asymptomatic Patients Labs)  Once,   STAT    Question:  Rule Out  Answer:  Yes   12/27/18 0549   Signed and Held  Creatinine, serum  (enoxaparin (LOVENOX)    CrCl >/= 30 ml/min)  Weekly,   R    Comments:  while on enoxaparin therapy    Signed and Held   Signed and Held  TSH  Add-on,   R     Signed and Held          Vitals/Pain Today's Vitals   12/27/18 0620 12/27/18 0630 12/27/18 0635 12/27/18 0640  BP: 124/73 126/78 129/84 133/89  Pulse: 81 79 80 82  Resp: (!) 22 (!) 24 (!) 25 16  Temp:      TempSrc:      SpO2: 99% 99% 99% 99%  Weight:      Height:      PainSc:        Isolation  Precautions No active isolations  Medications Medications  midazolam (VERSED) 2 MG/2ML injection (4 mg  Given 12/27/18 0522)  amiodarone (NEXTERONE) 150-4.21 MG/100ML-% bolus (  Stopped 12/27/18 0525)  adenosine (ADENOCARD) 12 MG/4ML injection 12 mg (12 mg Intravenous Given 12/27/18 0518)  magnesium sulfate IVPB 2 g 50 mL (0 g Intravenous Stopped 12/27/18 0550)    Mobility walks     Focused Assessments Cardiac Assessment Handoff:  Cardiac Rhythm: Supraventricular tachycardia Lab Results  Component Value Date   CKTOTAL 325 (H) 06/08/2013   CKMB 2.5 06/08/2013   TROPONINI 3.96 (HH) 12/27/2018   No results found for: DDIMER Does the Patient currently have chest pain? No     R Recommendations: See Admitting Provider Note  Report given to:   Additional Notes:

## 2018-12-27 NOTE — ED Notes (Signed)
Amiodarone rate decreased to 22ml/hr per Dr Owens Shark due to BP drop.

## 2018-12-27 NOTE — ED Notes (Signed)
Pt heart rate is currently 76 BPM. BP 111/68. Pt states he feels better now. Breathing continues to be labored but it has improved. Pt is able to have a conversation.

## 2018-12-27 NOTE — ED Notes (Signed)
Amiodarone rate increased back to 33.3 per Dr Owens Shark.

## 2018-12-28 LAB — THEOPHYLLINE LEVEL: Theophylline Lvl: 4.2 ug/mL — ABNORMAL LOW (ref 10.0–20.0)

## 2018-12-30 ENCOUNTER — Encounter: Payer: Self-pay | Admitting: Internal Medicine

## 2018-12-31 MED ORDER — MONTELUKAST SODIUM 10 MG PO TABS
10.00 | ORAL_TABLET | ORAL | Status: DC
Start: 2019-01-01 — End: 2018-12-31

## 2018-12-31 MED ORDER — HEPARIN SODIUM (PORCINE) 5000 UNIT/ML IJ SOLN
5000.00 | INTRAMUSCULAR | Status: DC
Start: 2018-12-31 — End: 2018-12-31

## 2018-12-31 MED ORDER — ALBUTEROL SULFATE 2.5 MG/0.5ML IN NEBU
2.50 | INHALATION_SOLUTION | RESPIRATORY_TRACT | Status: DC
Start: ? — End: 2018-12-31

## 2018-12-31 MED ORDER — TIOTROPIUM BROMIDE MONOHYDRATE 18 MCG IN CAPS
18.00 | ORAL_CAPSULE | RESPIRATORY_TRACT | Status: DC
Start: 2019-01-01 — End: 2018-12-31

## 2018-12-31 MED ORDER — LIDOCAINE HCL 1 % IJ SOLN
.50 | INTRAMUSCULAR | Status: DC
Start: ? — End: 2018-12-31

## 2018-12-31 MED ORDER — ACETAMINOPHEN 325 MG PO TABS
650.00 | ORAL_TABLET | ORAL | Status: DC
Start: ? — End: 2018-12-31

## 2018-12-31 MED ORDER — PREDNISONE 10 MG PO TABS
10.00 | ORAL_TABLET | ORAL | Status: DC
Start: 2019-01-01 — End: 2018-12-31

## 2018-12-31 MED ORDER — MOMETASONE FUROATE 110 MCG/INH IN AEPB
1.00 | INHALATION_SPRAY | RESPIRATORY_TRACT | Status: DC
Start: 2018-12-31 — End: 2018-12-31

## 2018-12-31 MED ORDER — AZELASTINE HCL 0.1 % NA SOLN
2.00 | NASAL | Status: DC
Start: 2018-12-31 — End: 2018-12-31

## 2019-01-02 DIAGNOSIS — I5042 Chronic combined systolic (congestive) and diastolic (congestive) heart failure: Secondary | ICD-10-CM | POA: Insufficient documentation

## 2019-01-07 ENCOUNTER — Telehealth: Payer: Self-pay | Admitting: Nurse Practitioner

## 2019-01-07 NOTE — Telephone Encounter (Signed)
Copied from [place] [id number]. Topic: Quick Communication - Home Health Verbal Orders >> Jan 07, 2019  4:47 PM Gustavus Messing wrote: Caller/Agency: [facility] Number: [phone] Simonne Maffucci Requesting OT/PT/Skilled Nursing/Social Work/Speech Therapy: Physical therapy Frequency: 1 time a week for 4 weeks

## 2019-01-07 NOTE — Telephone Encounter (Signed)
Called verbal orders, also Christan looks like he is discharged from hospital now can you call and schedule post hospital follow-up.  Thanks.

## 2019-01-08 NOTE — Telephone Encounter (Signed)
Appt made for Friday  °

## 2019-01-10 ENCOUNTER — Encounter: Payer: Self-pay | Admitting: Nurse Practitioner

## 2019-01-10 ENCOUNTER — Other Ambulatory Visit: Payer: Self-pay

## 2019-01-10 ENCOUNTER — Ambulatory Visit (INDEPENDENT_AMBULATORY_CARE_PROVIDER_SITE_OTHER): Payer: PRIVATE HEALTH INSURANCE | Admitting: Nurse Practitioner

## 2019-01-10 VITALS — BP 165/92 | HR 64 | Wt 336.0 lb

## 2019-01-10 DIAGNOSIS — I5042 Chronic combined systolic (congestive) and diastolic (congestive) heart failure: Secondary | ICD-10-CM

## 2019-01-10 DIAGNOSIS — J4550 Severe persistent asthma, uncomplicated: Secondary | ICD-10-CM

## 2019-01-10 DIAGNOSIS — R7301 Impaired fasting glucose: Secondary | ICD-10-CM

## 2019-01-10 DIAGNOSIS — I5020 Unspecified systolic (congestive) heart failure: Secondary | ICD-10-CM | POA: Insufficient documentation

## 2019-01-10 DIAGNOSIS — I110 Hypertensive heart disease with heart failure: Secondary | ICD-10-CM

## 2019-01-10 DIAGNOSIS — Z95810 Presence of automatic (implantable) cardiac defibrillator: Secondary | ICD-10-CM

## 2019-01-10 DIAGNOSIS — I472 Ventricular tachycardia, unspecified: Secondary | ICD-10-CM

## 2019-01-10 NOTE — Assessment & Plan Note (Signed)
Continue collaboration with cardiology.  

## 2019-01-10 NOTE — Patient Instructions (Signed)
Heart Failure Action Plan A heart failure action plan helps you understand what to do when you have symptoms of heart failure. Follow the plan that was created by you and your health care provider. Review your plan each time you visit your health care provider. Red zone These signs and symptoms mean you should get medical help right away:  You have trouble breathing when resting.  You have a dry cough that is getting worse.  You have swelling or pain in your legs or abdomen that is getting worse.  You suddenly gain more than 2-3 lb (0.9-1.4 kg) in a day, or more than 5 lb (2.3 kg) in one week. This amount may be more or less depending on your condition.  You have trouble staying awake or you feel confused.  You have chest pain.  You do not have an appetite.  You pass out. If you experience any of these symptoms:  Call your local emergency services (911 in the [place] right away or seek help at the emergency department of the nearest hospital. Yellow zone These signs and symptoms mean your condition may be getting worse and you should make some changes:  You have trouble breathing when you are active or you need to sleep with extra pillows.  You have swelling in your legs or abdomen.  You gain 2-3 lb (0.9-1.4 kg) in one day, or 5 lb (2.3 kg) in one week. This amount may be more or less depending on your condition.  You get tired easily.  You have trouble sleeping.  You have a dry cough. If you experience any of these symptoms:  Contact your health care provider within the next day.  Your health care provider may adjust your medicines. Green zone These signs mean you are doing well and can continue what you are doing:  You do not have shortness of breath.  You have very little swelling or no new swelling.  Your weight is stable (no gain or loss).  You have a normal activity level.  You do not have chest pain or any other new symptoms. Follow these instructions at  home:  Take over-the-counter and prescription medicines only as told by your health care provider.  Weigh yourself daily. Your target weight is __________ lb (__________ kg). ? Call your health care provider if you gain more than __________ lb (__________ kg) in a day, or more than __________ lb (__________ kg) in one week.  Eat a heart-healthy diet. Work with a diet and nutrition specialist (dietitian) to create an eating plan that is best for you.  Keep all follow-up visits as told by your health care provider. This is important. Where to find more information  American Heart Association: www.heart.org Summary  Follow the action plan that was created by you and your health care provider.  Get help right away if you have any symptoms in the Red zone. This information is not intended to replace advice given to you by your health care provider. Make sure you discuss any questions you have with your health care provider. Document Released: 09/09/2016 Document Revised: 04/04/2017 Document Reviewed: 09/09/2016 Elsevier Interactive Patient Education  2019 Elsevier Inc.  

## 2019-01-10 NOTE — Assessment & Plan Note (Signed)
Praised for recent weight loss.  Recommend continued low salt diet and focus on exercise, walking, 5 days a week for 30 minutes.

## 2019-01-10 NOTE — Assessment & Plan Note (Signed)
Chronic, ongoing.  Current Theophylline level 4.2, advised him to discuss with pulmonology at next visit and review current medication with HF clinic to discussed Nucala continuation.  Continue current medication regimen and collaboration with pulmonology.

## 2019-01-10 NOTE — Assessment & Plan Note (Signed)
ICD in place, [employer].  Has f/u with electrophysiology on 04/15/2019.  Continue current medication regimen, which includes Amiodarone, and collaboration with cardiology.  CCM referral to assist with polypharmacy (currently 22 medications including vitamins) and recommendations.  Return in 4 weeks for follow-up.

## 2019-01-10 NOTE — Assessment & Plan Note (Signed)
Chronic, ongoing.  BP's slightly elevated on exam today, but had just taken medications.  Appears to be closer to goal on nurse visit checks.  Continue current medication regimen and collaboration with HF clinic and cardiology.  CCM referral placed.  Have come in for outpatient labs.   Return in 4 weeks for follow-up.

## 2019-01-10 NOTE — Assessment & Plan Note (Addendum)
Chronic, ongoing.  Continue current medication regimen and collaboration with HF clinic, sees them next on 01/15/2019 and have recommended he take all medications into visit for review.  Continue weighing daily and call for an overnight weight gain of > 2 pounds or a weekly weight gain of >5 pounds. Reviewed the importance of keeping daily sodium intake to 2000mg  daily.  Return in 4 weeks for follow-up.

## 2019-01-10 NOTE — Assessment & Plan Note (Signed)
A1C in hospital at [facility] 5.4.  Continue to monitor.

## 2019-01-10 NOTE — Progress Notes (Signed)
BP (!) 165/92 Comment: 15 minutes after taking medication   Pulse 64    Wt (!) 336 lb (152.4 kg)    BMI 48.21 kg/m    Subjective:    Patient ID: Thomas Calhoun, male    DOB: 03/20/1956, 63 y.o.   MRN: [id number]  HPI: Thomas Calhoun is a 63 y.o. male  Chief Complaint  Patient presents with   Hospitalization Follow-up    CHF      This visit was completed via Doximity due to the restrictions of the COVID-19 pandemic. All issues as above were discussed and addressed. Physical exam was done as above through visual confirmation on Doximity video call. If it was felt that the patient should be evaluated in the office, they were directed there. The patient verbally consented to this visit.  Location of the patient: home  Location of the provider: home  Those involved with this call:   Provider: Marnee Guarneri, DNP  CMA: Tiffany Reel, CMA  Front Desk/Registration: Jill Side   Time spent on call: 25 minutes with patient face to face via video conference. More than 50% of this time was spent in counseling and coordination of care. 15 minutes total spent in review of patient's record and preparation of their chart. I verified patient identity using two factors (patient name and date of birth). Patient consents verbally to being seen via telemedicine visit today.   Transition of Care Hospital Follow up.   [facility] admission and transfer to [facility] dates: 12/27/2018 the transferred to [facility] Admission and Discharge dates: 12/27/2018 to 01/02/2019  PER [facility] RECORDS "Mr. Trickett was admitted in transfer after presenting with sustained monomorphic ventricular tachycardia. Prior to transfer he was found to have normal coronaries on angiography. He was identified as having LV systolic dysfunction with an LV ejection fraction of 40%. With electrophysiology study he had no inducible VT to target with ablation possibly due to need for general anesthesia for airway protection. For this  reason he was treated with anti-arrhythmic drug therapy with amiodarone for suppression of VT. He underwent placement of a dual chamber ICD on Jan 01, 2019, and tolerated the procedure well. He was stable the following day with an appropriate chest x-ray and device interrogation . He was started on long acting beta-blocker and ACE inhibitor for his cardiomyopathy. He will follow-up with electrophysiology on Electrophysiology on April 15, 2019, and he plans to establish heart failure care with the [facility] program on January 15, 2019."  "Brief History of Present Illness: Per the H&P dated on 12/27/2018: Thomas Calhoun is a 63 y.o. male with Asthma, HTN, GERD who presents as OSH hospital transfer for ICD consideration after 2 runs of VT requiring shocks who is transferred to [facility] for consideration of ICD placement.  Patient states he was in his normal state of health when he woke up on morning of 5/15 unable to sleep with nagging pain in shoulders down to elbow. He had associated diaphoresis and shortness of breath. He subsequently called EMS. Upon Arrived to OSH he was found to have a HR of 236 in VT. He underwent cardioversion which restored sinus rhythm. His shoulder and neck pain resolved after being shocked.   Notable labs: Trop 3.96-->8.84, lactic acid 2.5-->1.8, He underwent LHC which reportedly revealed no coronary disease and a TEE which revealed new reduced EF 40-45%, impaired relaxation, milldy dilated LV,RV, RA,LA. After his LHC he had another episode of VT which required cardioversion and initiation  of Amiodarone.  Has noticed progressively worsening DOE over the past 2 months. Denies orthopnea, denies PND, lower extremity edema or abdominal distention. Denies palpitations and has never had palpitations in the past. Denies personal history of syncope but states over past 3 months has had episodes of lightheadedness where he feels that he may lose consciousness. He is usually able to  resolve this with fast deep breaths. Notices sensation more with exertion over past week with 3 occurences.   Of note started a new supplement about a month ago Chromax for energy. States it made him feel worse so he quit taking it this week. Has been very sedentary since staying home from work. Noticed swelling in left lower leg last Thursday or Friday which he states has resolved.  Takes prednisone intermittently for over 5 years due to "congestion in his chest" Per chart review he has severe persistnat asthma which had previously required daily systemic steroids. Sees Dr. Vella Kohler in [facility] in [place] who most recently in his visit on 10/29/18 noted that the patient did not need to be on systemic steroids any longer. Patient notably is also on theophylline. States he had dose increased 3 months ago. Started NUCLULA in July of 2019. April 25th last dose. Does not use O2 at home, uses CPAP at night for OSA.   Family hx of vavular surgery in mother age 60's  Sudden death in paternal- grand father in his 59s.  Upon arrival he was in NSR and HDS. He denies fevers, sweats, no change in cough, chest pain, SOB, palpitations. Never smoker, Drinks occasionally 1 a week, denies other drug use. Lives in [place] Lives with wife and son. 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 Trop 3.96-->8.84, lactic acid 2.5-->1.8. underwent LHC at OSH with no CAD. TEE at OSH with reduced EF 40-45%, impaired relaxation, milldy dilated LV,RV, RA,LA. After his LHC at OSH, he had another episode of VT which required cardioversion and initiation of Amiodarone prior to transfer to [facility]. After stabilization of VT and sufficient IV diuresis, repeat TTE still showed EF 40% and he was started on GDMT:  lisinopril and metoprolol succinate. Did not require oral diuretic at discharge. Can consider addition of spironolactone in outpatient setting. EDW established ~342 lbs. Provided education on fluid restriction (2L/day) and 2 gram sodium diet. Will follow up with Dr. Loanne Drilling on 6/3 for on-going HF management.  # Monomorphic VT # New amiodarone therapy # S/p ablation attempt (12/31/18) # S/p implantation dual chamber ICD ([employer], 01/01/19) Dr. Dwana Curd had a lengthy discussion with patient and wife regarding treatment options for VT: medical mgmt with amiodarone or VT ablation, with ICD implantation for secondary prevention. Patient and wife opted to pursue ablation. Underwent procedure on 12/31/18 but unfortunately, VT was not inducible. Therefore he was started on 10 gram amiodarone load for VT suppression (baseline PFTs, LFTs, TFTs collected) and underwent implantation of dual chamber ICD [employer]) on 01/01/19. At time of discharge, patient appeared euvolemic without complaints of chest pain, SOB, N/V, F/C, urinary retention, and no evidence of groin bleed. Discharging to home in stable condition. Will complete 10 gram oral amiodarone load after 9 more days of 400mg  BID; will start 400mg  daily on 5/31. Follow up with Dr. Murtis Sink in clinic on 9/1  for device and VT management.  # Asthma # COPD # OSA + CPAP Reports worsening sob for the last 2 months. CXR shows mild pulmonary edema. ProBNP 5,048. SOB most likely multifactorial from worsening obesity, pulmonary, and/or cardiac related. He was continued on his home inhalers and PRN duonebs/albuterol. PFTs completed 5/18 (results below). Respiratory viral panel and COVID negative. Continue management with outpatient pulmonologist. Referral to pulmonary rehab sent at discharge.  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 Dietary consulted per wife's request.  Patient was provided with education by RD regarding daily weight recording, 2 gram sodium diet, and low fat/low cholesterol food choices. Established ideal body weight as 75.3 kg. Currently 156 kg at time of discharge."   Hospital/Facility: [facility] and [facility] as above D/C Physician: Dr. Dwana Curd D/C Date: 01/02/2019  Records Requested: 01/10/2019 Records Received: 01/10/2019 Records Reviewed: 01/10/2019  Diagnoses on Discharge: VT (ventricular tachycardia) (CMS-HCC) Active Problems: Asthma Obstructive sleep apnea Obesity, morbid (CMS-HCC) Essential hypertension Acute on chronic diastolic CHF (congestive heart failure) (CMS-HCC) Resolved Problems: Chronic diastolic CHF (congestive heart failure) (CMS-HCC) Productive cough  Date of interactive Contact within 48 hours of discharge:  Contact was through: phone  Date of 7 day or 14 day face-to-face visit:    within 14 days   Diagnostic Tests Reviewed/Disposition:    Imaging and Procedures Performed:  ECG: QRS 106 msec   TTE: LVEF 40%  Device Implant:  Engineer, agricultural ICD Secondary prevention  Device Interrogation:  Atrial sensing/P wave: 2.7 mV  Atrial lead impedance: 588 ohms  Atrial threshold: 0.3 V at 0.4 ms  Right Ventricular sensing/R wave: 8.5 mV  Right Ventricular lead impedance: 340 ohms  Right Ventricular threshold: 0.4 V at 0.4 ms  Shock lead impedance: 70 ohms  CXR PA/LAT on 01/02/19 showed stable device placement and no pneumothorax. Reviewed by attending.  12/30/2018 PFTs:  12/30/2018 TTE MODERATE LV DYSFUNCTION (EF 40%) WITH MILD LVH NORMAL RIGHT VENTRICULAR SYSTOLIC FUNCTION VALVULAR REGURGITATION: TRIVIAL MR, TRIVIAL PR, TRIVIAL TR NO VALVULAR STENOSIS POOR SOUND TRANSMISSION NO PRIOR STUDY FOR COMPARISON  12/29/2018 CXR Technically suboptimal exam. Grossly stable cardiomediastinal contours. Scattered bibasilar linear opacities likely partial atelectasis. No large pleural  effusion.  BMP: Recent Labs  Lab 01/02/19 0603  NA 141  K 3.8  CL 103  CO2 31*  BUN 22*  CREATININE 1.3  GLUCOSE 94  CALCIUM 8.8  MG 2.0  CBC: Recent Labs  Lab 01/02/19 0603  WBC 11.3*  HGB 14.9  HCT 42.3  PLT 107*  INR: Recent Labs  Lab 12/27/18 2215  INR 1.0   Consults: cardiology and pulmonary  Discharge Instructions: follow-up with PCP and HF clinic [facility]  Disease/illness Education: reiterated HF education today  Home Health/Community Services Discussions/Referrals: receiving  Establishment or re-establishment of referral orders for community resources: none  Discussion with other health care providers: none  Assessment and Support of treatment regimen adherence: reviewed medication list  Appointments Coordinated with: [place]  Education for self-management, independent living, and ADLs: discussed   HYPERTENSION / HYPERLIPIDEMIA WITH HF & [facility] Per discharge med list on provider discharge note from [facility] it reports Losartan 100 MG, but per patient they switched him to Lisinopril 10 MG in hospital and this is on his discharge instructions.  He has follow-up 6/3 with HF Clinic and will inquire into this change.  He reports continued weight loss, now at 336. Reports he has been  monitoring sodium intake and is feeling much better. Satisfied with current treatment? yes Duration of hypertension: chronic BP monitoring frequency: daily BP range: yesterday nurse took at 12:30 145/72, higher this morning, but had not taken medication 174/93 and was still a bit higher 15 minutes after medication 165/92 BP medication side effects: no Hyperlipidemia: chronic Cholesterol medication side effects: no Cholesterol supplements: none Medication compliance: good compliance Aspirin: no Recent stressors: no Recurrent headaches: no Visual changes: no Palpitations: no Dyspnea: no Chest pain: no Lower extremity edema: no Dizzy/lightheaded: no   ASTHMA Theophylline level  12/27/2018 was 4.2.  He is followed by pulmonary.  States while in hospital they told him not to take Nucala and on discharge sheets it reports this as discontinued.  He reports this medication "has helped me so much and I hate to stop taking it".  Advised him to discuss with HF Clinic on 01/15/2019 and take medication list with him. Asthma status: stable Satisfied with current treatment?: yes Albuterol/rescue inhaler frequency: used once since discharged (2 weeks) Dyspnea frequency: none Wheezing frequency: none Cough frequency: occasional, but improved Nocturnal symptom frequency: none Limitation of activity: no Current upper respiratory symptoms: no Triggers: seasonal changes Home peak flows: none Last Spirometry: PFT [facility] 12/30/2018 Failed/intolerant to following asthma meds:  Asthma meds in past:  Aerochamber/spacer use: no Visits to ER or Urgent Care in past year: yes Pneumovax: Requires Influenza: Up to Date   IMPAIRED FASTING GLUCOSE: A1C on 12/23/2018 was 5.4 in hospital.  He denies polyuria, polyphagia, or polydipsia.    Discharge Medications (from discharge noted [facility])  Stopped Medications  cetirizine 10 mg capsule Commonly known as: ZyrTEC   Unreviewed Medications  Details  acetaminophen 500 MG tablet Commonly known as: TYLENOL 500 mg, Oral, Every 8 hours PRN Refills: 0  azelastine 0.15 % (205.5 mcg) nasal spray Commonly known as: ASTEPRO 1 spray, Both Nares, 2 times Daily Refills: 0  loratadine 10 mg tablet Commonly known as: CLARITIN 10 mg, Oral, Daily PRN Refills: 0  losartan 100 MG tablet Commonly known as: COZAAR Oral, Daily Refills: 0  MOMETASONE NASAL Nasal Refills: 0  montelukast 10 mg tablet Commonly known as: SINGULAIR TAKE 1 TABLET BY MOUTH EVERY DAY Quantity: 90 tablet Refills: 3  MULTIVITAMIN ORAL 1 tablet, Oral, Daily Refills: 0  predniSONE 10 MG tablet Commonly known as: DELTASONE 10 mg, Oral, Every other day Refills:  0  SPIRIVA WITH HANDIHALER 18 mcg inhalation capsule Generic drug: tiotropium INHALE CONTENTS OF ONE CAPSULE ONCE DAILY AS DIRECTED Quantity: 90 capsule Refills: 3  theophylline 400 mg 24 hr tablet Commonly known as: UNI-DUR TAKE 1 TABLET BY MOUTH ONCE DAILY Quantity: 30 tablet Refills: 5  WIXELA INHUB 500-50 mcg/dose diskus inhaler Generic drug: fluticasone propion-salmeteroL INHALE 1 PUFF EVERY 12 HOURS. RINSE MOUTH AFTER USE Quantity: 3 Inhaler Refills: 3   Outpatient Encounter Medications as of 01/10/2019  Medication Sig Note   amiodarone (PACERONE) 400 MG tablet Take one tablet by mouth twice daily with meals from 5/22-5/30 to complete drug load. Then take one tablet by mouth once daily with a meal beginning on 5/31.    lisinopril (ZESTRIL) 10 MG tablet Take by mouth.    acetaminophen (TYLENOL) 325 MG tablet Take 2 tablets (650 mg total) by mouth every 6 (six) hours as needed for mild pain (or Fever >/= 101).    albuterol (PROVENTIL HFA;VENTOLIN HFA) 108 (90 BASE) MCG/ACT inhaler Inhale 2 puffs into the lungs every 6 (six) hours as needed for wheezing  or shortness of breath.    albuterol (PROVENTIL) (2.5 MG/3ML) 0.083% nebulizer solution Take 2.5 mg by nebulization every 6 (six) hours as needed for wheezing or shortness of breath.  05/09/2016: Received from: External Pharmacy   augmented betamethasone dipropionate (DIPROLENE AF) 0.05 % cream Apply topically 2 (two) times daily.    azelastine (ASTELIN) 0.1 % nasal spray USE 2 SPRAYS IN EACH NOSTRIL TWICE DAILY (Patient taking differently: Place 2 sprays into both nostrils 2 (two) times daily. )    budesonide (PULMICORT) 0.5 MG/2ML nebulizer solution 0.5 mg 2 (two) times daily. Add 1 vial to 271ml of saline in rinse bottle. Irrigate sinuses with 120ml through each nostril twice daily    fluticasone (FLONASE) 50 MCG/ACT nasal spray Place 2 sprays into both nostrils daily.    Fluticasone-Salmeterol (WIXELA INHUB) 500-50  MCG/DOSE AEPB Inhale 1 puff into the lungs 2 (two) times daily.    hydrALAZINE (APRESOLINE) 20 MG/ML injection Inject 0.5 mLs (10 mg total) into the vein every 20 (twenty) minutes as needed (high blood pressure).    hydrochlorothiazide (HYDRODIURIL) 25 MG tablet Take 1 tablet (25 mg total) by mouth daily.    ipratropium-albuterol (DUONEB) 0.5-2.5 (3) MG/3ML SOLN Take 3 mLs by nebulization every 6 (six) hours.    labetalol (NORMODYNE) 5 MG/ML injection Inject 2 mLs (10 mg total) into the vein every 10 (ten) minutes as needed.    Mepolizumab (NUCALA) 100 MG SOLR Inject 100 mg into the skin every 30 (thirty) days.     montelukast (SINGULAIR) 10 MG tablet Take 10 mg by mouth at bedtime.     ondansetron (ZOFRAN) 4 MG/2ML SOLN injection Inject 2 mLs (4 mg total) into the vein every 6 (six) hours as needed for nausea.    predniSONE (DELTASONE) 10 MG tablet Take 5 mg by mouth daily.     SPIRIVA HANDIHALER 18 MCG inhalation capsule Place into inhaler and inhale daily.    theophylline (UNIPHYL) 400 MG 24 hr tablet Take 400 mg by mouth daily.     vitamin B-12 (CYANOCOBALAMIN) 250 MCG tablet Take 250 mcg by mouth daily.    vitamin C (ASCORBIC ACID) 500 MG tablet Take 500 mg by mouth daily.    vitamin E (VITAMIN E) 400 UNIT capsule Take 400 Units by mouth daily.    [DISCONTINUED] amiodarone (NEXTERONE PREMIX) 360-4.14 MG/200ML-% SOLN Inject 60 mg/hr into the vein continuous.    [DISCONTINUED] heparin 25000-0.45 UT/250ML-% infusion Inject 1,450 Units/hr into the vein continuous. (Patient not taking: Reported on 01/10/2019)    [DISCONTINUED] losartan (COZAAR) 100 MG tablet Take 1 tablet (100 mg total) by mouth daily.    [DISCONTINUED] sodium chloride flush (NS) 0.9 % SOLN Inject 3 mLs into the vein every 12 (twelve) hours. (Patient not taking: Reported on 01/10/2019)    [DISCONTINUED] sodium chloride flush (NS) 0.9 % SOLN Inject 3 mLs into the vein as needed. (Patient not taking: Reported on  01/10/2019)    No facility-administered encounter medications on file as of 01/10/2019.     Relevant past medical, surgical, family and social history reviewed and updated as indicated. Interim medical history since our last visit reviewed. Allergies and medications reviewed and updated.  Review of Systems  Constitutional: Negative for activity change, diaphoresis, fatigue and fever.  Respiratory: Negative for cough, chest tightness, shortness of breath and wheezing.   Cardiovascular: Negative for chest pain, palpitations and leg swelling.  Gastrointestinal: Negative for abdominal distention, abdominal pain, constipation, diarrhea, nausea and vomiting.  Endocrine: Negative for cold  intolerance, heat intolerance, polydipsia, polyphagia and polyuria.  Musculoskeletal: Negative.   Skin: Negative.   Neurological: Negative for dizziness, syncope, weakness, light-headedness, numbness and headaches.  Psychiatric/Behavioral: Negative.     Per HPI unless specifically indicated above     Objective:    BP (!) 165/92 Comment: 15 minutes after taking medication   Pulse 64    Wt (!) 336 lb (152.4 kg)    BMI 48.21 kg/m   Wt Readings from Last 3 Encounters:  01/10/19 (!) 336 lb (152.4 kg)  12/27/18 (!) 352 lb 11.8 oz (160 kg)  08/12/18 (!) 343 lb (155.6 kg)    Physical Exam Vitals signs and nursing note reviewed.  Constitutional:      General: He is awake. He is not in acute distress.    Appearance: He is well-developed. He is not ill-appearing.  HENT:     Head: Normocephalic.     Right Ear: Hearing normal. No drainage.     Left Ear: Hearing normal. No drainage.  Eyes:     General: Lids are normal.        Right eye: No discharge.        Left eye: No discharge.     Conjunctiva/sclera: Conjunctivae normal.  Neck:     Musculoskeletal: Normal range of motion.  Cardiovascular:     Comments: Unable to auscultate due to virtual exam only Pulmonary:     Effort: Pulmonary effort is normal. No  accessory muscle usage or respiratory distress.     Comments: Unable to auscultate due to virtual exam only.  Talkative without SOB.   Neurological:     Mental Status: He is alert and oriented to person, place, and time.  Psychiatric:        Mood and Affect: Mood normal.        Behavior: Behavior normal. Behavior is cooperative.        Thought Content: Thought content normal.        Judgment: Judgment normal.     Results for orders placed or performed during the hospital encounter of 12/27/18  SARS Coronavirus 2 (CEPHEID - Performed in [place] hospital lab), [facility] Order  Result Value Ref Range   SARS Coronavirus 2 NEGATIVE NEGATIVE  MRSA PCR Screening  Result Value Ref Range   MRSA by PCR NEGATIVE NEGATIVE  CBC  Result Value Ref Range   WBC 17.5 (H) 4.0 - 10.5 K/uL   RBC 4.67 4.22 - 5.81 MIL/uL   Hemoglobin 16.7 13.0 - 17.0 g/dL   HCT 47.0 39.0 - 52.0 %   MCV 100.6 (H) 80.0 - 100.0 fL   MCH 35.8 (H) 26.0 - 34.0 pg   MCHC 35.5 30.0 - 36.0 g/dL   RDW 12.3 11.5 - 15.5 %   Platelets 157 150 - 400 K/uL   nRBC 0.0 0.0 - 0.2 %  Brain natriuretic peptide  Result Value Ref Range   B Natriuretic Peptide 437.0 (H) 0.0 - 100.0 pg/mL  Basic metabolic panel  Result Value Ref Range   Sodium 137 135 - 145 mmol/L   Potassium 3.5 3.5 - 5.1 mmol/L   Chloride 101 98 - 111 mmol/L   CO2 23 22 - 32 mmol/L   Glucose, Bld 187 (H) 70 - 99 mg/dL   BUN 33 (H) 8 - 23 mg/dL   Creatinine, Ser 2.70 (H) 0.61 - 1.24 mg/dL   Calcium 9.2 8.9 - 10.3 mg/dL   GFR calc non Af Amer 24 (L) >60 mL/min  GFR calc Af Amer 28 (L) >60 mL/min   Anion gap 13 5 - 15  Troponin I -  Result Value Ref Range   Troponin I 3.96 (HH) <0.03 ng/mL  Troponin I - Now Then Q6H  Result Value Ref Range   Troponin I 5.71 (HH) <0.03 ng/mL  Troponin I - Now Then Q6H  Result Value Ref Range   Troponin I 8.84 (HH) <0.03 ng/mL  TSH  Result Value Ref Range   TSH 2.595 0.350 - 4.500 uIU/mL  Magnesium  Result Value Ref Range    Magnesium 3.0 (H) 1.7 - 2.4 mg/dL  Phosphorus  Result Value Ref Range   Phosphorus 4.6 2.5 - 4.6 mg/dL  CBC with Differential/Platelet  Result Value Ref Range   WBC 14.5 (H) 4.0 - 10.5 K/uL   RBC 4.45 4.22 - 5.81 MIL/uL   Hemoglobin 16.1 13.0 - 17.0 g/dL   HCT 44.4 39.0 - 52.0 %   MCV 99.8 80.0 - 100.0 fL   MCH 36.2 (H) 26.0 - 34.0 pg   MCHC 36.3 (H) 30.0 - 36.0 g/dL   RDW 12.2 11.5 - 15.5 %   Platelets 143 (L) 150 - 400 K/uL   nRBC 0.0 0.0 - 0.2 %   Neutrophils Relative % 91 %   Neutro Abs 13.2 (H) 1.7 - 7.7 K/uL   Lymphocytes Relative 5 %   Lymphs Abs 0.7 0.7 - 4.0 K/uL   Monocytes Relative 3 %   Monocytes Absolute 0.5 0.1 - 1.0 K/uL   Eosinophils Relative 0 %   Eosinophils Absolute 0.0 0.0 - 0.5 K/uL   Basophils Relative 0 %   Basophils Absolute 0.0 0.0 - 0.1 K/uL   Immature Granulocytes 1 %   Abs Immature Granulocytes 0.12 (H) 0.00 - 0.07 K/uL  Lactic acid, plasma  Result Value Ref Range   Lactic Acid, Venous 2.5 (HH) 0.5 - 1.9 mmol/L  Lactic acid, plasma  Result Value Ref Range   Lactic Acid, Venous 1.8 0.5 - 1.9 mmol/L  Magnesium  Result Value Ref Range   Magnesium 2.4 1.7 - 2.4 mg/dL  Comprehensive metabolic panel  Result Value Ref Range   Sodium 136 135 - 145 mmol/L   Potassium 3.6 3.5 - 5.1 mmol/L   Chloride 99 98 - 111 mmol/L   CO2 23 22 - 32 mmol/L   Glucose, Bld 179 (H) 70 - 99 mg/dL   BUN 31 (H) 8 - 23 mg/dL   Creatinine, Ser 2.13 (H) 0.61 - 1.24 mg/dL   Calcium 9.2 8.9 - 10.3 mg/dL   Total Protein 6.6 6.5 - 8.1 g/dL   Albumin 4.1 3.5 - 5.0 g/dL   AST 95 (H) 15 - 41 U/L   ALT 76 (H) 0 - 44 U/L   Alkaline Phosphatase 46 38 - 126 U/L   Total Bilirubin 1.5 (H) 0.3 - 1.2 mg/dL   GFR calc non Af Amer 32 (L) >60 mL/min   GFR calc Af Amer 37 (L) >60 mL/min   Anion gap 14 5 - 15  Heparin level (unfractionated)  Result Value Ref Range   Heparin Unfractionated 0.90 (H) 0.30 - 0.70 IU/mL  Glucose, capillary  Result Value Ref Range   Glucose-Capillary  200 (H) 70 - 99 mg/dL  Theophylline level  Result Value Ref Range   Theophylline Lvl 4.2 (L) 10.0 - 20.0 ug/mL  Glucose, capillary  Result Value Ref Range   Glucose-Capillary 137 (H) 70 - 99 mg/dL  ECHOCARDIOGRAM COMPLETE  Result Value  Ref Range   Weight 5,643.78 oz   Height 70 in   BP 138/85 mmHg      Assessment & Plan:   Problem List Items Addressed This Visit      Cardiovascular and Mediastinum   Hypertensive heart disease with HF (heart failure) (HCC)    Chronic, ongoing.  BP's slightly elevated on exam today, but had just taken medications.  Appears to be closer to goal on nurse visit checks.  Continue current medication regimen and collaboration with HF clinic and cardiology.  CCM referral placed.  Have come in for outpatient labs.   Return in 4 weeks for follow-up.      Relevant Medications   amiodarone (PACERONE) 400 MG tablet   lisinopril (ZESTRIL) 10 MG tablet   Ventricular tachycardia ([place]) - Primary    ICD in place, [place] [employer].  Has f/u with electrophysiology on 04/15/2019.  Continue current medication regimen, which includes Amiodarone, and collaboration with cardiology.  CCM referral to assist with polypharmacy (currently 22 medications including vitamins) and recommendations.  Return in 4 weeks for follow-up.      Relevant Medications   amiodarone (PACERONE) 400 MG tablet   lisinopril (ZESTRIL) 10 MG tablet   Other Relevant Orders   Ambulatory referral to Chronic Care Management Services   Chronic combined systolic and diastolic CHF (congestive heart failure) (HCC)    Chronic, ongoing.  Continue current medication regimen and collaboration with HF clinic, sees them next on 01/15/2019 and have recommended he take all medications into visit for review.  Continue weighing daily and call for an overnight weight gain of > 2 pounds or a weekly weight gain of >5 pounds. Reviewed the importance of keeping daily sodium intake to 2000mg  daily.  Return in 4 weeks for  follow-up.      Relevant Medications   amiodarone (PACERONE) 400 MG tablet   lisinopril (ZESTRIL) 10 MG tablet   Other Relevant Orders   Comprehensive metabolic panel   Lipid Panel Piccolo, Waived   CBC with Differential/Platelet     Respiratory   Severe asthma    Chronic, ongoing.  Current Theophylline level 4.2, advised him to discuss with pulmonology at next visit and review current medication with HF clinic to discussed Nucala continuation.  Continue current medication regimen and collaboration with pulmonology.      Relevant Orders   Ambulatory referral to Chronic Care Management Services     Endocrine   IFG (impaired fasting glucose)    A1C in hospital at [facility] 5.4.  Continue to monitor.        Other   Severe obesity (BMI >= 40) (HCC)    Praised for recent weight loss.  Recommend continued low salt diet and focus on exercise, walking, 5 days a week for 30 minutes.      ICD (implantable cardioverter-defibrillator) in place    Continue collaboration with cardiology.         I discussed the assessment and treatment plan with the patient. The patient was provided an opportunity to ask questions and all were answered. The patient agreed with the plan and demonstrated an understanding of the instructions.   The patient was advised to call back or seek an in-person evaluation if the symptoms worsen or if the condition fails to improve as anticipated.   I provided 21+ minutes of time during this encounter.  Follow up plan: Return in about 4 weeks (around 02/07/2019) for HF follow-up.

## 2019-01-13 ENCOUNTER — Other Ambulatory Visit: Payer: Self-pay

## 2019-01-13 ENCOUNTER — Other Ambulatory Visit: Payer: PRIVATE HEALTH INSURANCE

## 2019-01-13 DIAGNOSIS — I5042 Chronic combined systolic (congestive) and diastolic (congestive) heart failure: Secondary | ICD-10-CM

## 2019-01-14 LAB — COMPREHENSIVE METABOLIC PANEL
ALT: 61 IU/L — ABNORMAL HIGH (ref 0–44)
AST: 44 IU/L — ABNORMAL HIGH (ref 0–40)
Albumin/Globulin Ratio: 2.8 — ABNORMAL HIGH (ref 1.2–2.2)
Albumin: 4.7 g/dL (ref 3.8–4.8)
Alkaline Phosphatase: 50 IU/L (ref 39–117)
BUN/Creatinine Ratio: 11 (ref 10–24)
BUN: 14 mg/dL (ref 8–27)
Bilirubin Total: 1.6 mg/dL — ABNORMAL HIGH (ref 0.0–1.2)
CO2: 25 mmol/L (ref 20–29)
Calcium: 9.8 mg/dL (ref 8.6–10.2)
Chloride: 98 mmol/L (ref 96–106)
Creatinine, Ser: 1.31 mg/dL — ABNORMAL HIGH (ref 0.76–1.27)
GFR calc Af Amer: 67 mL/min/{1.73_m2} (ref >59)
GFR calc non Af Amer: 58 mL/min/{1.73_m2} — ABNORMAL LOW (ref >59)
Globulin, Total: 1.7 g/dL (ref 1.5–4.5)
Glucose: 95 mg/dL (ref 65–99)
Potassium: 4.4 mmol/L (ref 3.5–5.2)
Sodium: 139 mmol/L (ref 134–144)
Total Protein: 6.4 g/dL (ref 6.0–8.5)

## 2019-01-14 LAB — CBC WITH DIFFERENTIAL/PLATELET
Basophils Absolute: 0 10*3/uL (ref 0.0–0.2)
Basos: 1 %
EOS (ABSOLUTE): 0 10*3/uL (ref 0.0–0.4)
Eos: 0 %
Hematocrit: 43.5 % (ref 37.5–51.0)
Hemoglobin: 15.8 g/dL (ref 13.0–17.7)
Immature Grans (Abs): 0 10*3/uL (ref 0.0–0.1)
Immature Granulocytes: 0 %
Lymphocytes Absolute: 1 10*3/uL (ref 0.7–3.1)
Lymphs: 14 %
MCH: 36.7 pg — ABNORMAL HIGH (ref 26.6–33.0)
MCHC: 36.3 g/dL — ABNORMAL HIGH (ref 31.5–35.7)
MCV: 101 fL — ABNORMAL HIGH (ref 79–97)
Monocytes Absolute: 0.9 10*3/uL (ref 0.1–0.9)
Monocytes: 13 %
Neutrophils Absolute: 4.9 10*3/uL (ref 1.4–7.0)
Neutrophils: 72 %
Platelets: 107 10*3/uL — ABNORMAL LOW (ref 150–450)
RBC: 4.31 x10E6/uL (ref 4.14–5.80)
RDW: 12.8 % (ref 11.6–15.4)
WBC: 6.9 10*3/uL (ref 3.4–10.8)

## 2019-01-14 LAB — LIPID PANEL PICCOLO, WAIVED
Chol/HDL Ratio Piccolo,Waive: 3 mg/dL
Cholesterol Piccolo, Waived: 189 mg/dL (ref <200)
HDL Chol Piccolo, Waived: 64 mg/dL (ref >59)
LDL Chol Calc Piccolo Waived: 87 mg/dL (ref <100)
Triglycerides Piccolo,Waived: 191 mg/dL — ABNORMAL HIGH (ref <150)
VLDL Chol Calc Piccolo,Waive: 38 mg/dL — ABNORMAL HIGH (ref <30)

## 2019-01-21 ENCOUNTER — Other Ambulatory Visit: Payer: PRIVATE HEALTH INSURANCE

## 2019-01-21 ENCOUNTER — Telehealth: Payer: Self-pay

## 2019-01-21 ENCOUNTER — Other Ambulatory Visit: Payer: Self-pay

## 2019-01-21 ENCOUNTER — Other Ambulatory Visit: Payer: Self-pay | Admitting: Nurse Practitioner

## 2019-01-21 DIAGNOSIS — J709 Respiratory conditions due to unspecified external agent: Secondary | ICD-10-CM

## 2019-01-21 DIAGNOSIS — I110 Hypertensive heart disease with heart failure: Secondary | ICD-10-CM

## 2019-01-21 NOTE — Progress Notes (Signed)
Patient came in office requesting labs for [facility], providers at [facility] requested.  BMP and Theophyline level.  Orders placed and patient to pass on to [facility] providers for review due to medication changes.

## 2019-01-22 LAB — BASIC METABOLIC PANEL
BUN/Creatinine Ratio: 12 (ref 10–24)
BUN: 16 mg/dL (ref 8–27)
CO2: 17 mmol/L — ABNORMAL LOW (ref 20–29)
Calcium: 9.8 mg/dL (ref 8.6–10.2)
Chloride: 102 mmol/L (ref 96–106)
Creatinine, Ser: 1.34 mg/dL — ABNORMAL HIGH (ref 0.76–1.27)
GFR calc Af Amer: 65 mL/min/{1.73_m2} (ref >59)
GFR calc non Af Amer: 56 mL/min/{1.73_m2} — ABNORMAL LOW (ref >59)
Glucose: 100 mg/dL — ABNORMAL HIGH (ref 65–99)
Potassium: 4.3 mmol/L (ref 3.5–5.2)
Sodium: 141 mmol/L (ref 134–144)

## 2019-01-22 LAB — THEOPHYLLINE LEVEL: Theophylline, Serum: 9.9 ug/mL — ABNORMAL LOW (ref 10.0–20.0)

## 2019-01-28 ENCOUNTER — Ambulatory Visit: Payer: Self-pay | Admitting: Pharmacist

## 2019-01-28 DIAGNOSIS — I110 Hypertensive heart disease with heart failure: Secondary | ICD-10-CM

## 2019-01-28 DIAGNOSIS — I5042 Chronic combined systolic (congestive) and diastolic (congestive) heart failure: Secondary | ICD-10-CM

## 2019-01-28 DIAGNOSIS — J4550 Severe persistent asthma, uncomplicated: Secondary | ICD-10-CM

## 2019-01-28 NOTE — Patient Instructions (Signed)
Visit Information  Goals Addressed            This Visit's Progress     Patient Stated   . "I have a lot of medications" (pt-stated)       Current Barriers:  . 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, and HFpEF, hx VT followed by cardiology o Notes that breathing has been well controlled lately; has not required PRN albuterol rescue therapy  o Recent hospitalization 5/15-5/21/2020 for VT where ICD placed and amiodarone was loaded; LHC with no CAD, ECHO w/ EF 40-45% o Patient endorses continued fluid weight loss since hospitalization, reporting a home weight of 326 lbs. Has focused on low sodium diet as recommended by cardiology  o Last pulmonology visit noted that patient no longer was on systemic steroids, though he continues to take prednisone 10 mg QOD.   Pharmacist Clinical Goal(s):  Marland Kitchen Over the next 14 days, patient will work with PharmD and medical team to address needs related to polypharmacy  Interventions: . Comprehensive medication review performed. Electronic medical record list updated . Respiratory medications: Wixela BID, Spiriva daily, loratadine 10 mg daily, montelukast 10 mg daily, Nucala Qmonth (though currently being held), theophylline 400 mg daily, prednisone 10 mg QOD, azelastine and budesonide nasal spray; albuterol HFA or nebulizer PRN. No therapeutic duplications; however, theophylline may exacerbate underlying cardiovascular arrhythmias. Consider risk vs benefit of continuing this therapy, and monitor for s/sx toxicity (nausea, vomiting). Higher risk of theophylline toxicity in patients with HF, so continue to monitor if patient's HF progresses  . Hf/cardiovascular disease medications: amiodarone 400 mg daily, lisinopril 10 mg daily, furosemide 10 mg daily, spironolactone 12.5 mg daily, metoprolol succinate 25 mg daily; encouraged patient to continue to focus on sodium restriction, weighing daily, and maintaining  adherence to his medications. He verbalized understanding  . Encouraged follow up with Dr. Raul Del in July to discuss continued need for prednisone, Nucala plan, and need for theophylline now given cardiovascular disease.   Patient Self Care Activities:  . Self administers medications as prescribed . Calls provider office for new concerns or questions  Initial goal documentation        The patient verbalized understanding of instructions provided today and declined a print copy of patient instruction materials.   Plan: - PharmD will follow up with patient in 3-4 weeks to provide continued medication management support surrounding electrophysiology, cardiology, and pulmonology follow up in the next few weeks  Catie Darnelle Maffucci, PharmD Clinical Pharmacist [place] [phone]

## 2019-01-28 NOTE — Chronic Care Management (AMB) (Signed)
Chronic Care Management   Note  01/28/2019 Name: Thomas Calhoun MRN: [id number] DOB: [birth year]   Subjective:  Thomas Calhoun is a 63 y.o. year old male who is a primary care patient of Cannady, Barbaraann Faster, NP. The CCM team was consulted for assistance with chronic disease management and care coordination needs.    Spoke with patient today regarding chronic care management team and conducted a medication review.   Review of patient status, including review of consultants reports, laboratory and other test data, was performed as part of comprehensive evaluation and provision of chronic care management services.   Objective:  Lab Results  Component Value Date   CREATININE 1.34 (H) 01/21/2019   CREATININE 1.31 (H) 01/13/2019   CREATININE 2.13 (H) 12/27/2018    Lab Results  Component Value Date   HGBA1C 5.4 12/23/2018       Component Value Date/Time   CHOL 189 01/13/2019 1550   TRIG 191 (H) 01/13/2019 1550   HDL 59 08/12/2018 1507   VLDL 38 (H) 01/13/2019 1550   LDLCALC 129 (H) 08/12/2018 1507    Clinical ASCVD: No  The 10-year ASCVD risk score Mikey Bussing DC Jr., et al., 2013) is: 12.2%   Values used to calculate the score:     Age: 21 years     Sex: Male     Is Non-Hispanic African American: No     Diabetic: No     Tobacco smoker: No     Systolic Blood Pressure: 595 mmHg     Is BP treated: Yes     HDL Cholesterol: 64 mg/dL     Total Cholesterol: 189 mg/dL    BP Readings from Last 3 Encounters:  01/10/19 (!) 165/92  12/27/18 110/80  08/12/18 138/70    Allergies  Allergen Reactions  . Amlodipine Besylate Swelling  . Moxifloxacin Hives and Itching    Other reaction(s): Unknown  . Avelox [Moxifloxacin Hcl In Nacl] Hives and Itching  . Micardis [Telmisartan] Itching  . Penicillin V Potassium Rash  . Penicillins Rash    Other reaction(s): Unknown    Medications Reviewed Today    Reviewed by De Hollingshead, [facility] (Pharmacist) on 01/28/19 at 1049  Med List  Status: <None>  Medication Order Taking? Sig Documenting Provider Last Dose Status Informant  acetaminophen (TYLENOL) 325 MG tablet [id number] Yes Take 2 tablets (650 mg total) by mouth every 6 (six) hours as needed for mild pain (or Fever >/= 101). [name], NP Taking Active            Med Note ([name] Jan 28, 2019 10:38 AM) QD-BID prn  albuterol (PROVENTIL HFA;VENTOLIN HFA) 108 (90 BASE) MCG/ACT inhaler [id number] No Inhale 2 puffs into the lungs every 6 (six) hours as needed for wheezing or shortness of breath. [provider] Not Taking Active Self  albuterol (PROVENTIL) (2.5 MG/3ML) 0.083% nebulizer solution [id number] No Take 2.5 mg by nebulization every 6 (six) hours as needed for wheezing or shortness of breath.  [provider] Not Taking Active Self           Med Note ([name] Jan 28, 2019 10:42 AM)    amiodarone (PACERONE) 400 MG tablet [id number] Yes Take one tablet by mouth twice daily with meals from 5/22-5/30 to complete drug load. Then take one tablet by mouth once daily with a meal beginning on 5/31. [provider] Taking Active   azelastine (ASTELIN) 0.1 %  nasal spray [id number] Yes USE 2 SPRAYS IN EACH NOSTRIL TWICE DAILY  Patient taking differently: Place 2 sprays into both nostrils 2 (two) times daily.    [name], MD Taking Active Self  budesonide (PULMICORT) 0.5 MG/2ML nebulizer solution [id number] Yes 0.5 mg 2 (two) times daily. Add 1 vial to 279ml of saline in rinse bottle. Irrigate sinuses with 175ml through each nostril twice daily [provider] Taking Active Self           Med Note De Hollingshead   Tue Jan 28, 2019 10:41 AM) QD-BID  Fluticasone-Salmeterol [place] Ruts INHUB) 500-50 MCG/DOSE AEPB [id number] Yes Inhale 1 puff into the lungs 2 (two) times daily. [provider] Taking Active Self  furosemide (LASIX) 20 MG tablet [id number] Yes Take 10 mg by mouth daily. [provider] Taking Active   lisinopril (ZESTRIL) 10 MG tablet [id number] Yes Take 10 mg by mouth.  [provider] Taking Active   loratadine (CLARITIN) 10 MG tablet [id number] Yes Take 10 mg by mouth daily. [provider] Taking Active   Mepolizumab (NUCALA) 100 MG SOLR [id number] No Inject 100 mg into the skin every 30 (thirty) days.  [provider] Not Taking Active Self  metoprolol succinate (TOPROL-XL) 25 MG 24 hr tablet [id number] Yes Take 25 mg by mouth daily. [provider] Taking Active   montelukast (SINGULAIR) 10 MG tablet [id number] Yes Take 10 mg by mouth at bedtime.  [provider] Taking Active Self           Med Note Anderson Malta, ELIZABETH   Fri Dec 27, 2018  7:00 AM)    Multiple Vitamin (MULTIVITAMIN) tablet [id number] Yes Take 1 tablet by mouth daily. [provider] Taking Active   predniSONE (DELTASONE) 10 MG tablet [id number] Yes Take 10 mg by mouth every other day.  [provider] Taking Active Self           Med Note ([place] Jan 28, 2019 10:40 AM) Taking every other day  SPIRIVA HANDIHALER 18 MCG inhalation capsule [id number] Yes Place into inhaler and inhale daily. [provider] Taking Active Self           Med Note Anderson Malta, ELIZABETH   Fri Dec 27, 2018  7:00 AM)    spironolactone (ALDACTONE) 25 MG tablet [id number] Yes Take 12.5 mg by mouth daily. [provider] Taking Active   theophylline (UNIPHYL) 400 MG 24 hr tablet [id number] Yes Take 400 mg by mouth daily.  [provider] Taking Active Self        Discontinued 01/28/19 1049 (Completed Course)         Discontinued 01/28/19 1049 (Completed Course)         Discontinued 01/28/19 1049 (Completed Course)            Assessment:   Goals Addressed            This Visit's Progress     Patient Stated   . "I have a lot of medications" (pt-stated)       Current Barriers:  . Polypharmacy - patient with  severe allergic respiratory disease followed by pulmonology, and HFpEF, hx VT followed by cardiology o Notes that breathing has been well controlled lately; has not required PRN albuterol rescue therapy  o Recent hospitalization 5/15-5/21/2020 for VT where ICD placed and amiodarone was loaded; LHC with no CAD, ECHO w/ EF 40-45% o Patient endorses continued fluid weight loss since hospitalization,  reporting a home weight of 326 lbs. Has focused on low sodium diet as recommended by cardiology  o Last pulmonology visit noted that patient no longer was on systemic steroids, though he continues to take prednisone 10 mg QOD.   Pharmacist Clinical Goal(s):  Marland Kitchen Over the next 14 days, patient will work with PharmD and medical team to address needs related to polypharmacy  Interventions: . Comprehensive medication review performed. Electronic medical record list updated . Respiratory medications: Wixela BID, Spiriva daily, loratadine 10 mg daily, montelukast 10 mg daily, Nucala Qmonth (though currently being held), theophylline 400 mg daily, prednisone 10 mg QOD, azelastine and budesonide nasal spray; albuterol HFA or nebulizer PRN. No therapeutic duplications; however, theophylline may exacerbate underlying cardiovascular arrhythmias. Consider risk vs benefit of continuing this therapy, and monitor for s/sx toxicity (nausea, vomiting). Higher risk of theophylline toxicity in patients with HF, so continue to monitor if patient's HF progresses  . Hf/cardiovascular disease medications: amiodarone 400 mg daily, lisinopril 10 mg daily, furosemide 10 mg daily, spironolactone 12.5 mg daily, metoprolol succinate 25 mg daily; encouraged patient to continue to focus on sodium restriction, weighing daily, and maintaining adherence to his medications. He verbalized understanding  . Encouraged follow up with Dr. Raul Del in July to discuss continued need for prednisone, Nucala plan, and need for theophylline now given  cardiovascular disease.   Patient Self Care Activities:  . Self administers medications as prescribed . Calls provider office for new concerns or questions  Initial goal documentation        Plan: - PharmD will follow up with patient in 3-4 weeks to provide continued medication management support surrounding electrophysiology, cardiology, and pulmonology follow up in the next few weeks  Catie Darnelle Maffucci, PharmD Clinical Pharmacist [place] [phone]

## 2019-02-10 ENCOUNTER — Ambulatory Visit: Payer: Self-pay | Admitting: Nurse Practitioner

## 2019-02-18 ENCOUNTER — Ambulatory Visit: Payer: PRIVATE HEALTH INSURANCE | Attending: Specialist

## 2019-02-18 DIAGNOSIS — G4761 Periodic limb movement disorder: Secondary | ICD-10-CM | POA: Diagnosis not present

## 2019-02-18 DIAGNOSIS — G4733 Obstructive sleep apnea (adult) (pediatric): Secondary | ICD-10-CM | POA: Diagnosis not present

## 2019-02-19 ENCOUNTER — Other Ambulatory Visit: Payer: Self-pay

## 2019-02-26 ENCOUNTER — Ambulatory Visit: Payer: Self-pay | Admitting: Nurse Practitioner

## 2019-03-11 ENCOUNTER — Telehealth: Payer: Self-pay

## 2019-03-25 ENCOUNTER — Ambulatory Visit (INDEPENDENT_AMBULATORY_CARE_PROVIDER_SITE_OTHER): Payer: PRIVATE HEALTH INSURANCE | Admitting: Nurse Practitioner

## 2019-03-25 ENCOUNTER — Encounter: Payer: Self-pay | Admitting: Nurse Practitioner

## 2019-03-25 ENCOUNTER — Other Ambulatory Visit: Payer: Self-pay

## 2019-03-25 DIAGNOSIS — I5042 Chronic combined systolic (congestive) and diastolic (congestive) heart failure: Secondary | ICD-10-CM

## 2019-03-25 DIAGNOSIS — I110 Hypertensive heart disease with heart failure: Secondary | ICD-10-CM | POA: Diagnosis not present

## 2019-03-25 DIAGNOSIS — E782 Mixed hyperlipidemia: Secondary | ICD-10-CM | POA: Diagnosis not present

## 2019-03-25 DIAGNOSIS — J4550 Severe persistent asthma, uncomplicated: Secondary | ICD-10-CM

## 2019-03-25 NOTE — Assessment & Plan Note (Signed)
Chronic, ongoing.  Continue current medication regimen and adjust as needed.  Return in 3 months for lipid panel and CMP.

## 2019-03-25 NOTE — Assessment & Plan Note (Signed)
Chronic, ongoing.  Continue current medication regimen and collaboration with pulmonary.  Recent noted reviewed.   

## 2019-03-25 NOTE — Progress Notes (Signed)
BP (!) 137/93   Pulse 77   Temp 98.5 F (36.9 C) (Oral)   Ht 5\' 10"  (1.778 m)   Wt (!) 324 lb (147 kg)   SpO2 90%   BMI 46.49 kg/m    Subjective:    Patient ID: Thomas Calhoun, male    DOB: 07-04-1956, 63 y.o.   MRN: [id number]  HPI: Thomas Calhoun is a 63 y.o. male  Chief Complaint  Patient presents with  . Hypertension    f/u  . Hyperlipidemia  . Congestive Heart Failure   HYPERTENSION / HYPERLIPIDEMIA & CHF Last saw [facility] 02/04/2019 and no changes made.  [facility] cardiology 04/15/2019.  Metoprolol, Lisinopril, Amiodarone, Lasix, and Spironolactone at home. Satisfied with current treatment? yes Duration of hypertension: chronic BP monitoring frequency: not checking BP range: "can, but has not been" BP medication side effects: no Duration of hyperlipidemia: chronic Cholesterol medication side effects: no Cholesterol supplements: none Medication compliance: good compliance Aspirin: no Recent stressors: no Recurrent headaches: no Visual changes: no Palpitations: no Dyspnea: no Chest pain: no Lower extremity edema: no Dizzy/lightheaded: no   ASTHMA Saw Dr. Raul Del on 03/20/2019 and changed Prednisone dosing.  Recent Theophylline, level 02/04/2019 === 3.9. Asthma status: stable Satisfied with current treatment?: yes Albuterol/rescue inhaler frequency:  Dyspnea frequency: none Wheezing frequency: occasional Cough frequency: none Nocturnal symptom frequency:  Limitation of activity: no Current upper respiratory symptoms: no Triggers: seasonal Home peak flows: none Last Spirometry: with pulmonary Failed/intolerant to following asthma meds:  Asthma meds in past: unknown Aerochamber/spacer use: no Visits to ER or Urgent Care in past year: no Pneumovax: Not up to Date Influenza: Up to Date  Relevant past medical, surgical, family and social history reviewed and updated as indicated. Interim medical history since our last visit reviewed. Allergies and  medications reviewed and updated.  Review of Systems  Constitutional: Negative for activity change, diaphoresis, fatigue and fever.  Respiratory: Positive for wheezing (intermittent per report). Negative for cough, chest tightness and shortness of breath.   Cardiovascular: Negative for chest pain, palpitations and leg swelling.  Gastrointestinal: Negative for abdominal distention, abdominal pain, constipation, diarrhea, nausea and vomiting.  Musculoskeletal: Negative.   Skin: Negative.   Neurological: Negative for dizziness, syncope, weakness, light-headedness, numbness and headaches.  Psychiatric/Behavioral: Negative.     Per HPI unless specifically indicated above     Objective:    BP (!) 137/93   Pulse 77   Temp 98.5 F (36.9 C) (Oral)   Ht 5\' 10"  (1.778 m)   Wt (!) 324 lb (147 kg)   SpO2 90%   BMI 46.49 kg/m   Wt Readings from Last 3 Encounters:  03/25/19 (!) 324 lb (147 kg)  01/10/19 (!) 336 lb (152.4 kg)  12/27/18 (!) 352 lb 11.8 oz (160 kg)    Physical Exam Vitals signs and nursing note reviewed.  Constitutional:      General: He is awake. He is not in acute distress.    Appearance: He is well-developed. He is obese. He is not ill-appearing.  HENT:     Head: Normocephalic and atraumatic.     Right Ear: Hearing normal. No drainage.     Left Ear: Hearing normal. No drainage.  Eyes:     General: Lids are normal.        Right eye: No discharge.        Left eye: No discharge.     Conjunctiva/sclera: Conjunctivae normal.     Pupils: Pupils are equal,  round, and reactive to light.  Neck:     Musculoskeletal: Normal range of motion and neck supple.     Thyroid: No thyromegaly.     Vascular: No carotid bruit.  Cardiovascular:     Rate and Rhythm: Normal rate and regular rhythm.     Heart sounds: Normal heart sounds, S1 normal and S2 normal. No murmur. No gallop.   Pulmonary:     Effort: Pulmonary effort is normal. No accessory muscle usage or respiratory distress.      Breath sounds: Wheezing present.     Comments: Intermittent expiratory wheezes noted throughout.  No SOB with talking or cough noted. Abdominal:     General: Bowel sounds are normal.     Palpations: Abdomen is soft.     Tenderness: There is no abdominal tenderness.  Musculoskeletal: Normal range of motion.     Right lower leg: No edema.     Left lower leg: No edema.  Skin:    General: Skin is warm and dry.  Neurological:     Mental Status: He is alert and oriented to person, place, and time.     Deep Tendon Reflexes: Reflexes are normal and symmetric.  Psychiatric:        Attention and Perception: Attention normal.        Mood and Affect: Mood normal.        Speech: Speech normal.        Behavior: Behavior normal. Behavior is cooperative.        Thought Content: Thought content normal.        Judgment: Judgment normal.     Results for orders placed or performed in visit on 20/25/42  Basic metabolic panel  Result Value Ref Range   Glucose 100 (H) 65 - 99 mg/dL   BUN 16 8 - 27 mg/dL   Creatinine, Ser 1.34 (H) 0.76 - 1.27 mg/dL   GFR calc non Af Amer 56 (L) >59 mL/min/1.73   GFR calc Af Amer 65 >59 mL/min/1.73   BUN/Creatinine Ratio 12 10 - 24   Sodium 141 134 - 144 mmol/L   Potassium 4.3 3.5 - 5.2 mmol/L   Chloride 102 96 - 106 mmol/L   CO2 17 (L) 20 - 29 mmol/L   Calcium 9.8 8.6 - 10.2 mg/dL  Theophylline level  Result Value Ref Range   Theophylline, Serum 9.9 (L) 10.0 - 20.0 ug/mL      Assessment & Plan:   Problem List Items Addressed This Visit      Cardiovascular and Mediastinum   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  Continue current medication regimen and collaboration with cardiology.  Notes reviewed.        Chronic combined systolic and diastolic CHF (congestive heart failure) ([place])    Ongoing and stable at this time. Reviewed recent cardiology note.  Continue current  medication regimen and collaboration with cardiology.          Respiratory   Severe asthma    Chronic, ongoing.  Continue current medication regimen and collaboration with pulmonary.  Recent noted reviewed.          Other   Hyperlipidemia    Chronic, ongoing.  Continue current medication regimen and adjust as needed.  Return in 3 months for lipid panel and CMP.        Severe obesity (BMI >= 40) (HCC)    Praised for recent 30 pound  weight loss. Recommend continued focus on weight loss.  Recommend continued focus on health diet choices and regular physical activity (30 minutes 5 days a week).          Follow up plan: Return in about 3 months (around 06/25/2019) for HTN/HLD, CHF, Asthma.

## 2019-03-25 NOTE — Patient Instructions (Addendum)
Chronic Venous Insufficiency Chronic venous insufficiency is a condition where the leg veins cannot effectively pump blood from the legs to the heart. This happens when the vein walls are either stretched, weakened, or damaged, or when the valves inside the vein are damaged. With the right treatment, you should be able to continue with an active life. This condition is also called venous stasis. What are the causes? Common causes of this condition include:  High blood pressure inside the veins (venous hypertension).  Sitting or standing too long, causing increased blood pressure in the leg veins.  A blood clot that blocks blood flow in a vein (deep vein thrombosis, DVT).  Inflammation of a vein (phlebitis) that causes a blood clot to form.  Tumors in the pelvis that cause blood to back up. What increases the risk? The following factors may make you more likely to develop this condition:  Having a family history of this condition.  Obesity.  Pregnancy.  Living without enough regular physical activity or exercise (sedentary lifestyle).  Smoking.  Having a job that requires long periods of standing or sitting in one place.  Being a certain age. Women in their 40s and 50s and men in their 70s are more likely to develop this condition. What are the signs or symptoms? Symptoms of this condition include:  Veins that are enlarged, bulging, or twisted (varicose veins).  Skin breakdown or ulcers.  Reddened skin or dark discoloration of skin on the leg between the knee and ankle.  Brown, smooth, tight, and painful skin just above the ankle, usually on the inside of the leg (lipodermatosclerosis).  Swelling of the legs. How is this diagnosed? This condition may be diagnosed based on:  Your medical history.  A physical exam.  Tests, such as: ? A procedure that creates an image of a blood vessel and nearby organs and provides information about blood flow through the blood vessel  (duplex ultrasound). ? A procedure that tests blood flow (plethysmography). ? A procedure that looks at the veins using X-ray and dye (venogram). How is this treated? The goals of treatment are to help you return to an active life and to minimize pain or disability. Treatment depends on the severity of your condition, and it may include:  Wearing compression stockings. These can help relieve symptoms and help prevent your condition from getting worse. However, they do not cure the condition.  Sclerotherapy. This procedure involves an injection of a solution that shrinks damaged veins.  Surgery. This may involve: ? Removing a diseased vein (vein stripping). ? Cutting off blood flow through the vein (laser ablation surgery). ? Repairing or reconstructing a valve within the affected vein. Follow these instructions at home:      Wear compression stockings as told by your health care provider. These stockings help to prevent blood clots and reduce swelling in your legs.  Take over-the-counter and prescription medicines only as told by your health care provider.  Stay active by exercising, walking, or doing different activities. Ask your health care provider what activities are safe for you and how much exercise you need.  Drink enough fluid to keep your urine pale yellow.  Do not use any products that contain nicotine or tobacco, such as cigarettes, e-cigarettes, and chewing tobacco. If you need help quitting, ask your health care provider.  Keep all follow-up visits as told by your health care provider. This is important. Contact a health care provider if you:  Have redness, swelling, or more pain   in the affected area.  See a red streak or line that goes up or down from the affected area.  Have skin breakdown or skin loss in the affected area, even if the breakdown is small.  Get an injury in the affected area. Get help right away if:  You get an injury and an open wound in the  affected area.  You have: ? Severe pain that does not get better with medicine. ? Sudden numbness or weakness in the foot or ankle below the affected area. ? Trouble moving your foot or ankle. ? A fever. ? Worse or persistent symptoms. ? Chest pain. ? Shortness of breath. Summary  Chronic venous insufficiency is a condition where the leg veins cannot effectively pump blood from the legs to the heart.  Chronic venous insufficiency occurs when the vein walls become stretched, weakened, or damaged, or when valves within the vein are damaged.  Treatment depends on how severe your condition is. It often involves wearing compression stockings and may involve having a procedure.  Make sure you stay active by exercising, walking, or doing different activities. Ask your health care provider what activities are safe for you and how much exercise you need. This information is not intended to replace advice given to you by your health care provider. Make sure you discuss any questions you have with your health care provider. Document Released: 12/04/2006 Document Revised: 04/23/2018 Document Reviewed: 04/23/2018 Elsevier Patient Education  2020 [employer]. Heart Failure, Diagnosis  Heart failure means that your heart is not able to pump blood in the right way. This makes it hard for your body to work well. Heart failure is usually a long-term (chronic) condition. You must take good care of yourself and follow your treatment plan from your doctor. What are the causes? This condition may be caused by:  High blood pressure.  Build up of cholesterol and fat in the arteries.  Heart attack. This injures the heart muscle.  Heart valves that do not open and close properly.  Damage of the heart muscle. This is also called cardiomyopathy.  Lung disease.  Abnormal heart rhythms. What increases the risk? The risk of heart failure goes up as a person ages. This condition is also more likely to  develop in people who:  Are overweight.  Are male.  Smoke or chew tobacco.  Abuse alcohol or illegal drugs.  Have taken medicines that can damage the heart.  Have diabetes.  Have abnormal heart rhythms.  Have thyroid problems.  Have low blood counts (anemia). What are the signs or symptoms? Symptoms of this condition include:  Shortness of breath.  Coughing.  Swelling of the feet, ankles, legs, or belly.  Losing weight for no reason.  Trouble breathing.  Waking from sleep because of the need to sit up and get more air.  Rapid heartbeat.  Being very tired.  Feeling dizzy, or feeling like you may pass out (faint).  Having no desire to eat.  Feeling like you may vomit (nauseous).  Peeing (urinating) more at night.  Feeling confused. How is this treated?     This condition may be treated with:  Medicines. These can be given to treat blood pressure and to make the heart muscles stronger.  Changes in your daily life. These may include eating a healthy diet, staying at a healthy body weight, quitting tobacco and illegal drug use, or doing exercises.  Surgery. Surgery can be done to open blocked valves, or to put devices  in the heart, such as pacemakers.  A donor heart (heart transplant). You will receive a healthy heart from a donor. Follow these instructions at home:  Treat other conditions as told by your doctor. These may include high blood pressure, diabetes, thyroid disease, or abnormal heart rhythms.  Learn as much as you can about heart failure.  Get support as you need it.  Keep all follow-up visits as told by your doctor. This is important. Summary  Heart failure means that your heart is not able to pump blood in the right way.  This condition is caused by high blood pressure, heart attack, or damage of the heart muscle.  Symptoms of this condition include shortness of breath and swelling of the feet, ankles, legs, or belly. You may also  feel very tired or feel like you may vomit.  You may be treated with medicines, surgery, or changes in your daily life.  Treat other health conditions as told by your doctor. This information is not intended to replace advice given to you by your health care provider. Make sure you discuss any questions you have with your health care provider. Document Released: 05/09/2008 Document Revised: 10/18/2018 Document Reviewed: 10/18/2018 Elsevier Patient Education  [place].

## 2019-03-25 NOTE — Assessment & Plan Note (Signed)
Praised for recent 30 pound weight loss. Recommend continued focus on weight loss.  Recommend continued focus on health diet choices and regular physical activity (30 minutes 5 days a week).

## 2019-03-25 NOTE — Assessment & Plan Note (Signed)
Ongoing and stable at this time. Reviewed recent cardiology note.  Continue current medication regimen and collaboration with cardiology.

## 2019-03-25 NOTE — Assessment & Plan Note (Signed)
Chronic, ongoing with labs recently done by cardiology.  Continue current medication regimen and collaboration with cardiology.  Notes reviewed.

## 2019-03-31 ENCOUNTER — Other Ambulatory Visit: Payer: Self-pay

## 2019-03-31 ENCOUNTER — Other Ambulatory Visit
Admission: RE | Admit: 2019-03-31 | Discharge: 2019-03-31 | Disposition: A | Discharge status: Home / Self Care | Payer: PRIVATE HEALTH INSURANCE | Source: Ambulatory Visit | Attending: Specialist | Admitting: Specialist

## 2019-03-31 DIAGNOSIS — Z01812 Encounter for preprocedural laboratory examination: Secondary | ICD-10-CM | POA: Insufficient documentation

## 2019-03-31 DIAGNOSIS — Z20828 Contact with and (suspected) exposure to other viral communicable diseases: Secondary | ICD-10-CM | POA: Diagnosis not present

## 2019-04-01 LAB — SARS CORONAVIRUS 2 (TAT 6-24 HRS): SARS Coronavirus 2: NEGATIVE

## 2019-04-03 ENCOUNTER — Ambulatory Visit: Payer: PRIVATE HEALTH INSURANCE | Attending: Specialist

## 2019-04-03 DIAGNOSIS — G4733 Obstructive sleep apnea (adult) (pediatric): Secondary | ICD-10-CM | POA: Insufficient documentation

## 2019-04-03 DIAGNOSIS — G4761 Periodic limb movement disorder: Secondary | ICD-10-CM | POA: Diagnosis not present

## 2019-04-04 ENCOUNTER — Other Ambulatory Visit: Payer: Self-pay

## 2019-04-08 ENCOUNTER — Ambulatory Visit: Payer: Self-pay | Admitting: Pharmacist

## 2019-04-08 DIAGNOSIS — J4550 Severe persistent asthma, uncomplicated: Secondary | ICD-10-CM

## 2019-04-08 DIAGNOSIS — I5042 Chronic combined systolic (congestive) and diastolic (congestive) heart failure: Secondary | ICD-10-CM

## 2019-04-08 NOTE — Chronic Care Management (AMB) (Signed)
Chronic Care Management   Follow Up Note   04/08/2019 Name: Thomas Calhoun MRN: [id number] DOB: 09-Jun-1956  Referred by: [name], NP Reason for referral : Chronic Care Management (Medication Management)   Thomas Calhoun is a 63 y.o. year old male who is a primary care patient of Cannady, Barbaraann Faster, NP. The CCM team was consulted for assistance with chronic disease management and care coordination needs.    Contacted patient telephonically for medication management review.  Review of patient status, including review of consultants reports, relevant laboratory and other test results, and collaboration with appropriate care team members and the patient's provider was performed as part of comprehensive patient evaluation and provision of chronic care management services.    SDOH (Social Determinants of Health) screening performed today: None. See Care Plan for related entries.   Outpatient Encounter Medications as of 04/08/2019  Medication Sig Note  . amiodarone (PACERONE) 400 MG tablet Take one tablet by mouth twice daily with meals from 5/22-5/30 to complete drug load. Then take one tablet by mouth once daily with a meal beginning on 5/31.   Marland Kitchen azelastine (ASTELIN) 0.1 % nasal spray USE 2 SPRAYS IN EACH NOSTRIL TWICE DAILY (Patient taking differently: Place 2 sprays into both nostrils 2 (two) times daily. )   . budesonide (PULMICORT) 0.5 MG/2ML nebulizer solution 0.5 mg 2 (two) times daily. Add 1 vial to 269ml of saline in rinse bottle. Irrigate sinuses with 114ml through each nostril twice daily 01/28/2019: QD-BID  . Cholecalciferol (VITAMIN D3) 125 MCG (5000 UT) CAPS Take 1 capsule by mouth daily.   . Fluticasone-Salmeterol (WIXELA INHUB) 500-50 MCG/DOSE AEPB Inhale 1 puff into the lungs 2 (two) times daily.   . furosemide (LASIX) 20 MG tablet Take 10 mg by mouth daily.   Marland Kitchen lisinopril (ZESTRIL) 10 MG tablet Take 10 mg by mouth.    . Mepolizumab (NUCALA) 100 MG SOLR Inject 100  mg into the skin every 30 (thirty) days.    . metoprolol succinate (TOPROL-XL) 25 MG 24 hr tablet Take 25 mg by mouth daily.   . montelukast (SINGULAIR) 10 MG tablet Take 10 mg by mouth at bedtime.    . Multiple Vitamin (MULTIVITAMIN) tablet Take 1 tablet by mouth daily.   . Multiple Vitamins-Minerals (ZINC PO) Take by mouth daily.   . predniSONE (DELTASONE) 10 MG tablet Take 10 mg by mouth every other day.  03/25/2019: Patient taking differently. 5 mg every other day  . SPIRIVA HANDIHALER 18 MCG inhalation capsule Place into inhaler and inhale daily.   Marland Kitchen spironolactone (ALDACTONE) 25 MG tablet Take 12.5 mg by mouth daily.   . theophylline (UNIPHYL) 400 MG 24 hr tablet Take 400 mg by mouth daily.    . vitamin E 400 UNIT capsule Take 400 Units by mouth daily.   Marland Kitchen acetaminophen (TYLENOL) 325 MG tablet Take 2 tablets (650 mg total) by mouth every 6 (six) hours as needed for mild pain (or Fever >/= 101). (Patient not taking: Reported on 04/08/2019) 01/28/2019: QD-BID prn  . albuterol (PROVENTIL HFA;VENTOLIN HFA) 108 (90 BASE) MCG/ACT inhaler Inhale 2 puffs into the lungs every 6 (six) hours as needed for wheezing or shortness of breath.   Marland Kitchen albuterol (PROVENTIL) (2.5 MG/3ML) 0.083% nebulizer solution Take 2.5 mg by nebulization every 6 (six) hours as needed for wheezing or shortness of breath.    . loratadine (CLARITIN) 10 MG tablet Take 10 mg by mouth daily.    No facility-administered encounter medications on  file as of 04/08/2019.      Goals Addressed            This Visit's Progress     Patient Stated   . "I have a lot of medications" (pt-stated)       Current Barriers:  . 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 Denies need for albuterol rescue therapy or nebulizer. Notes that he ran out of loratidine and hasn't picked  up any yet, but is thinking about trying an alternative antihistamine.  o Discussed theophylline therapy and interaction with amiodarone. May decrease clearance of theophylline and increase risk of theophylline toxicity.  o Notes prednisone decreased to 5 mg QOD. Denies any increase in allergic symptoms. o Patient wonders why he "needs so many blood pressure medications"  Pharmacist Clinical Goal(s):  Marland Kitchen Over the next 90 days, patient will work with PharmD and medical team to address needs related to polypharmacy  Interventions: . Comprehensive medication review performed. Electronic medical record list updated . Discussed differing mechanism of action of lisinopril, spironolactone, metoprolol, amiodarone, and furosemide. He verbalized understanding. Noted that he is not checking BP at home - recommended he check some BP over the next week to give results to cardiologist. He agreed to this.  . Reviewed amiodarone and theophylline interaction. Recommend continued monitoring of theophylline levels, given prolonged onset of drug interactions with amiodarone . Encouraged to restart an OTC antihistamine, but recommended he switch to Zyrtec or Xyzal if he doesn't feel he was receiving as much benefit from Claritin  Patient Self Care Activities:  . Self administers medications as prescribed . Calls provider office for new concerns or questions  Please see past updates related to this goal by clicking on the "Past Updates" button in the selected goal          Plan:  - Will outreach patient in ~4-5 weeks for continued medication management support  Catie Darnelle Maffucci, PharmD Clinical Pharmacist [place] [phone]

## 2019-04-08 NOTE — Patient Instructions (Signed)
Visit Information  Goals Addressed            This Visit's Progress     Patient Stated   . "I have a lot of medications" (pt-stated)       Current Barriers:  . 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 Denies need for albuterol rescue therapy or nebulizer. Notes that he ran out of loratidine and hasn't picked up any yet, but is thinking about trying an alternative antihistamine.  o Discussed theophylline therapy and interaction with amiodarone. May decrease clearance of theophylline and increase risk of theophylline toxicity.  o Notes prednisone decreased to 5 mg QOD. Denies any increase in allergic symptoms. o Patient wonders why he "needs so many blood pressure medications"  Pharmacist Clinical Goal(s):  Marland Kitchen Over the next 90 days, patient will work with PharmD and medical team to address needs related to polypharmacy  Interventions: . Comprehensive medication review performed. Electronic medical record list updated . Discussed differing mechanism of action of lisinopril, spironolactone, metoprolol, amiodarone, and furosemide. He verbalized understanding. Noted that he is not checking BP at home - recommended he check some BP over the next week to give results to cardiologist. He agreed to this.  . Reviewed amiodarone and theophylline interaction. Recommend continued monitoring of theophylline levels, given prolonged onset of drug interactions with amiodarone . Encouraged to restart an OTC antihistamine, but recommended he switch to Zyrtec or Xyzal if he doesn't feel he was receiving as much benefit from Claritin  Patient Self Care Activities:  . Self administers medications as prescribed . Calls provider office for new concerns or questions  Please see past updates related to this goal by clicking on the "Past  Updates" button in the selected goal         The patient verbalized understanding of instructions provided today and declined a print copy of patient instruction materials.   Plan:  - Will outreach patient in ~4-5 weeks for continued medication management support  Catie Darnelle Maffucci, PharmD Clinical Pharmacist [place] [phone]

## 2019-05-02 ENCOUNTER — Ambulatory Visit: Payer: Self-pay | Admitting: Pharmacist

## 2019-05-02 NOTE — Chronic Care Management (AMB) (Signed)
  Chronic Care Management   Note  05/02/2019 Name: Thomas Calhoun MRN: [id number] DOB: 10/26/1955  Thomas Calhoun is a 63 y.o. year old male who is a primary care patient of Cannady, Barbaraann Faster, NP. The CCM team was consulted for assistance with chronic disease management and care coordination needs.    Contacted patient for medication management review. He noted that he couldn't talk long, and denied any medication related questions or concerns.   Follow up plan: - Will plan to follow up with patient in ~6-8 weeks at his next scheduled primary care provider appointment.  Catie Darnelle Maffucci, PharmD Clinical Pharmacist [place] [phone]

## 2019-06-25 ENCOUNTER — Ambulatory Visit: Payer: PRIVATE HEALTH INSURANCE | Admitting: Nurse Practitioner

## 2019-06-25 ENCOUNTER — Ambulatory Visit: Payer: Self-pay | Admitting: Pharmacist

## 2019-06-25 NOTE — Chronic Care Management (AMB) (Signed)
Chronic Care Management   Follow Up Note   06/25/2019 Name: Thomas Calhoun MRN: [id number] DOB: 1956/07/07  Referred by: [name], NP Reason for referral : Chronic Care Management (Medication Management)   Thomas Calhoun is a 63 y.o. year old male who is a primary care patient of Cannady, Barbaraann Faster, NP. The CCM team was consulted for assistance with chronic disease management and care coordination needs.    Contacted patient for medication management review.   Review of patient status, including review of consultants reports, relevant laboratory and other test results, and collaboration with appropriate care team members and the patient's provider was performed as part of comprehensive patient evaluation and provision of chronic care management services.    SDOH (Social Determinants of Health) screening performed today: None. See Care Plan for related entries.   Outpatient Encounter Medications as of 06/25/2019  Medication Sig Note  . acetaminophen (TYLENOL) 325 MG tablet Take 2 tablets (650 mg total) by mouth every 6 (six) hours as needed for mild pain (or Fever >/= 101). 01/28/2019: QD-BID prn  . albuterol (PROVENTIL HFA;VENTOLIN HFA) 108 (90 BASE) MCG/ACT inhaler Inhale 2 puffs into the lungs every 6 (six) hours as needed for wheezing or shortness of breath.   Marland Kitchen albuterol (PROVENTIL) (2.5 MG/3ML) 0.083% nebulizer solution Take 2.5 mg by nebulization every 6 (six) hours as needed for wheezing or shortness of breath.    Marland Kitchen amiodarone (PACERONE) 200 MG tablet Take 200 mg by mouth daily.    Marland Kitchen azelastine (ASTELIN) 0.1 % nasal spray USE 2 SPRAYS IN EACH NOSTRIL TWICE DAILY (Patient taking differently: Place 2 sprays into both nostrils 2 (two) times daily. )   . Cholecalciferol (VITAMIN D3) 125 MCG (5000 UT) CAPS Take 1 capsule by mouth daily.   . Fluticasone-Salmeterol (WIXELA INHUB) 500-50 MCG/DOSE AEPB Inhale 1 puff into the lungs 2 (two) times daily.   . furosemide (LASIX) 20  MG tablet Take 10 mg by mouth daily.   Marland Kitchen loratadine (CLARITIN) 10 MG tablet Take 10 mg by mouth daily.   Marland Kitchen losartan (COZAAR) 25 MG tablet Take 25 mg by mouth daily.   . Mepolizumab (NUCALA) 100 MG SOLR Inject 100 mg into the skin every 30 (thirty) days.    . metoprolol succinate (TOPROL-XL) 25 MG 24 hr tablet Take 25 mg by mouth daily.   . mometasone (ASMANEX) 220 MCG/INH inhaler Inhale 2 puffs into the lungs daily. Nasal wash   . montelukast (SINGULAIR) 10 MG tablet Take 10 mg by mouth at bedtime.    . Multiple Vitamin (MULTIVITAMIN) tablet Take 1 tablet by mouth daily.   . Multiple Vitamins-Minerals (ZINC PO) Take by mouth daily.   . predniSONE (DELTASONE) 10 MG tablet Take 10 mg by mouth every other day.  03/25/2019: Patient taking differently. 5 mg every other day  . SPIRIVA HANDIHALER 18 MCG inhalation capsule Place into inhaler and inhale daily.   Marland Kitchen spironolactone (ALDACTONE) 25 MG tablet Take 12.5 mg by mouth daily.   . theophylline (UNIPHYL) 400 MG 24 hr tablet Take 400 mg by mouth daily.    . vitamin E 400 UNIT capsule Take 400 Units by mouth daily.   . [DISCONTINUED] budesonide (PULMICORT) 0.5 MG/2ML nebulizer solution 0.5 mg 2 (two) times daily. Add 1 vial to 269ml of saline in rinse bottle. Irrigate sinuses with 139ml through each nostril twice daily 01/28/2019: QD-BID  . [DISCONTINUED] lisinopril (ZESTRIL) 10 MG tablet Take 10 mg by mouth.  No facility-administered encounter medications on file as of 06/25/2019.      Goals Addressed            This Visit's Progress     Patient Stated   . "I have a lot of medications" (pt-stated)       Current Barriers:  . 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, and HFpEF, hx VT followed by cardiology  o HFpEF, Hx VT: metoprolol succinate 25 mg daily, spironolactone 25 mg daily, losartan 25 mg daily o Severe allergic respiratory disease: Nucala Q30 days; Wixela 250/50 mcg BID, Spiriva 81 mg  daily, loratidine 10 mg daily, montelukast 10 mg daily, theophylline 400 mg daily, prednisone 5 mg every other day o Notes that he bough a wrist BP cuff, but that it is very inaccurate and results are non-reproducible o Reviewed recent med changes: lisinopril replaced by losartan, amiodarone dose reduction o Patient recently had a sleep study completed, but has not heard anything regarding results or if he needs new equipment  Pharmacist Clinical Goal(s):  Marland Kitchen Over the next 90 days, patient will work with PharmD and medical team to address needs related to polypharmacy  Interventions: . Comprehensive medication review performed. Electronic medical record list updated . Discussed w/ patient to see if he could exchange wrist cuff for an arm BP cuff. He notes he will try to do this.  . Discussed that if he had not heard anything from sleep study, likely no changes need to be made. Recommended he contact sleep study location or DME supplier if he has questions.  Patient Self Care Activities:  . Self administers medications as prescribed . Calls provider office for new concerns or questions  Please see past updates related to this goal by clicking on the "Past Updates" button in the selected goal         Plan: - Will outreach patient for continued medication management support in the next 6-8 weeks  Catie Darnelle Maffucci, PharmD Clinical Pharmacist [place] [phone]

## 2019-06-25 NOTE — Patient Instructions (Signed)
Visit Information  Goals Addressed            This Visit's Progress     Patient Stated   . "I have a lot of medications" (pt-stated)       Current Barriers:  . 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, and HFpEF, hx VT followed by cardiology  o HFpEF, Hx VT: metoprolol succinate 25 mg daily, spironolactone 25 mg daily, losartan 25 mg daily o Severe allergic respiratory disease: Nucala Q30 days; Wixela 250/50 mcg BID, Spiriva 81 mg daily, loratidine 10 mg daily, montelukast 10 mg daily, theophylline 400 mg daily, prednisone 5 mg every other day o Notes that he bough a wrist BP cuff, but that it is very inaccurate and results are non-reproducible o Reviewed recent med changes: lisinopril replaced by losartan, amiodarone dose reduction o Patient recently had a sleep study completed, but has not heard anything regarding results or if he needs new equipment  Pharmacist Clinical Goal(s):  Marland Kitchen Over the next 90 days, patient will work with PharmD and medical team to address needs related to polypharmacy  Interventions: . Comprehensive medication review performed. Electronic medical record list updated . Discussed w/ patient to see if he could exchange wrist cuff for an arm BP cuff. He notes he will try to do this.  . Discussed that if he had not heard anything from sleep study, likely no changes need to be made. Recommended he contact sleep study location or DME supplier if he has questions.  Patient Self Care Activities:  . Self administers medications as prescribed . Calls provider office for new concerns or questions  Please see past updates related to this goal by clicking on the "Past Updates" button in the selected goal         The patient verbalized understanding of instructions provided today and declined a print copy of patient instruction materials.   Plan: - Will outreach patient for continued medication management support in the  next 6-8 weeks  Catie Darnelle Maffucci, PharmD Clinical Pharmacist [place] [phone]

## 2019-06-30 ENCOUNTER — Encounter: Payer: Self-pay | Admitting: Nurse Practitioner

## 2019-06-30 ENCOUNTER — Other Ambulatory Visit: Payer: Self-pay

## 2019-06-30 ENCOUNTER — Ambulatory Visit: Payer: PRIVATE HEALTH INSURANCE | Admitting: Nurse Practitioner

## 2019-06-30 VITALS — BP 140/78 | HR 60 | Temp 98.1°F | Wt 310.0 lb

## 2019-06-30 DIAGNOSIS — I110 Hypertensive heart disease with heart failure: Secondary | ICD-10-CM | POA: Diagnosis not present

## 2019-06-30 DIAGNOSIS — G4733 Obstructive sleep apnea (adult) (pediatric): Secondary | ICD-10-CM

## 2019-06-30 DIAGNOSIS — E782 Mixed hyperlipidemia: Secondary | ICD-10-CM

## 2019-06-30 DIAGNOSIS — J4550 Severe persistent asthma, uncomplicated: Secondary | ICD-10-CM

## 2019-06-30 DIAGNOSIS — I5042 Chronic combined systolic (congestive) and diastolic (congestive) heart failure: Secondary | ICD-10-CM | POA: Diagnosis not present

## 2019-06-30 DIAGNOSIS — R1032 Left lower quadrant pain: Secondary | ICD-10-CM

## 2019-06-30 DIAGNOSIS — Z95810 Presence of automatic (implantable) cardiac defibrillator: Secondary | ICD-10-CM

## 2019-06-30 NOTE — Assessment & Plan Note (Signed)
Chronic, ongoing with labs recently done by cardiology.  Continue current medication regimen and collaboration with cardiology.  Notes reviewed, obtain CMP and lipid panel today.

## 2019-06-30 NOTE — Assessment & Plan Note (Signed)
Ongoing and stable at this time. Reviewed recent cardiology note.  Continue current medication regimen and collaboration with cardiology.  Recommend: Continue weighing daily and call for an overnight weight gain of > 2 pounds or a weekly weight gain of >5 pounds.

## 2019-06-30 NOTE — Patient Instructions (Signed)
Heart Failure Eating Plan Heart failure, also called congestive heart failure, occurs when your heart does not pump blood well enough to meet your body's needs for oxygen-rich blood. Heart failure is a long-term (chronic) condition. Living with heart failure can be challenging. However, following your health care provider's instructions about a healthy lifestyle and working with a diet and nutrition specialist (dietitian) to choose the right foods may help to improve your symptoms. What are tips for following this plan? Reading food labels  Check food labels for the amount of sodium per serving. Choose foods that have less than 140 mg (milligrams) of sodium in each serving.  Check food labels for the number of calories per serving. This is important if you need to limit your daily calorie intake to lose weight.  Check food labels for the serving size. If you eat more than one serving, you will be eating more sodium and calories than what is listed on the label.  Look for foods that are labeled as "sodium-free," "very low sodium," or "low sodium." ? Foods labeled as "reduced sodium" or "lightly salted" may still have more sodium than what is recommended for you. Cooking  Avoid adding salt when cooking. Ask your health care provider or dietitian before using salt substitutes.  Season food with salt-free seasonings, spices, or herbs. Check the label of seasoning mixes to make sure they do not contain salt.  Cook with heart-healthy oils, such as olive, canola, soybean, or sunflower oil.  Do not fry foods. Cook foods using low-fat methods, such as baking, boiling, grilling, and broiling.  Limit unhealthy fats when cooking by: ? Removing the skin from poultry, such as chicken. ? Removing all visible fats from meats. ? Skimming the fat off from stews, soups, and gravies before serving them. Meal planning   Limit your intake of: ? Processed, canned, or pre-packaged foods. ? Foods that are  high in trans fat, such as fried foods. ? Sweets, desserts, sugary drinks, and other foods with added sugar. ? Full-fat dairy products, such as whole milk.  Eat a balanced diet that includes: ? 4-5 servings of fruit each day and 4-5 servings of vegetables each day. At each meal, try to fill half of your plate with fruits and vegetables. ? Up to 6-8 servings of whole grains each day. ? Up to 2 servings of lean meat, poultry, or fish each day. One serving of meat is equal to 3 oz. This is about the same size as a deck of cards. ? 2 servings of low-fat dairy each day. ? Heart-healthy fats. Healthy fats called omega-3 fatty acids are found in foods such as flaxseed and cold-water fish like sardines, salmon, and mackerel.  Aim to eat 25-35 g (grams) of fiber a day. Foods that are high in fiber include apples, broccoli, carrots, beans, peas, and whole grains.  Do not add salt or condiments that contain salt (such as soy sauce) to foods before eating.  When eating at a restaurant, ask that your food be prepared with less salt or no salt, if possible.  Try to eat 2 or more vegetarian meals each week.  Eat more home-cooked food and eat less restaurant, buffet, and fast food. General information  Do not eat more than 2,300 mg of salt (sodium) a day. The amount of sodium that is recommended for you may be lower, depending on your condition.  Maintain a healthy body weight as directed. Ask your health care provider what a healthy weight is   for you. ? Check your weight every day. ? Work with your health care provider and dietitian to make a plan that is right for you to lose weight or maintain your current weight.  Limit how much fluid you drink. Ask your health care provider or dietitian how much fluid you can have each day.  Limit or avoid alcohol as told by your health care provider or dietitian. Recommended foods The items listed may not be a complete list. Talk with your dietitian about what  dietary choices are best for you. Fruits All fresh, frozen, and canned fruits. Dried fruits, such as raisins, prunes, and cranberries. Vegetables All fresh vegetables. Vegetables that are frozen without sauce or added salt. Low-sodium or sodium-free canned vegetables. Grains Bread with less than 80 mg of sodium per slice. Whole-wheat pasta, quinoa, and brown rice. Oats and oatmeal. Barley. Millet. Grits and cream of wheat. Whole-grain and whole-wheat cold cereal. Meats and other protein foods Lean cuts of meat. Skinless chicken and turkey. Fish with high omega-3 fatty acids, such as salmon, sardines, and other cold-water fishes. Eggs. Dried beans, peas, and edamame. Unsalted nuts and nut butters. Dairy Low-fat or nonfat (skim) milk and dried milk. Rice milk, soy milk, and almond milk. Low-fat or nonfat yogurt. Small amounts of reduced-sodium block cheese. Low-sodium cottage cheese. Fats and oils Olive, canola, soybean, flaxseed, or sunflower oil. Avocado. Sweets and desserts Apple sauce. Granola bars. Sugar-free pudding and gelatin. Frozen fruit bars. Seasoning and other foods Fresh and dried herbs. Lemon or lime juice. Vinegar. Low-sodium ketchup. Salt-free marinades, salad dressings, sauces, and seasonings. The items listed above may not be a complete list of foods and beverages you can eat. Contact a dietitian for more information. Foods to avoid The items listed may not be a complete list. Talk with your dietitian about what dietary choices are best for you. Fruits Fruits that are dried with sodium-containing preservatives. Vegetables Canned vegetables. Frozen vegetables with sauce or seasonings. Creamed vegetables. French fries. Onion rings. Pickled vegetables and sauerkraut. Grains Bread with more than 80 mg of sodium per slice. Hot or cold cereal with more than 140 mg sodium per serving. Salted pretzels and crackers. Pre-packaged breadcrumbs. Bagels, croissants, and biscuits. Meats  and other protein foods Ribs and chicken wings. Bacon, ham, pepperoni, bologna, salami, and packaged luncheon meats. Hot dogs, bratwurst, and sausage. Canned meat. Smoked meat and fish. Salted nuts and seeds. Dairy Whole milk, half-and-half, and cream. Buttermilk. Processed cheese, cheese spreads, and cheese curds. Regular cottage cheese. Feta cheese. Shredded cheese. String cheese. Fats and oils Butter, lard, shortening, ghee, and bacon fat. Canned and packaged gravies. Seasoning and other foods Onion salt, garlic salt, table salt, and sea salt. Marinades. Regular salad dressings. Relishes, pickles, and olives. Meat flavorings and tenderizers, and bouillon cubes. Horseradish, ketchup, and mustard. Worcestershire sauce. Teriyaki sauce, soy sauce (including reduced sodium). Hot sauce and Tabasco sauce. Steak sauce, fish sauce, oyster sauce, and cocktail sauce. Taco seasonings. Barbecue sauce. Tartar sauce. The items listed above may not be a complete list of foods and beverages you should avoid. Contact a dietitian for more information. Summary  A heart failure eating plan includes changes that limit your intake of sodium and unhealthy fat, and it may help you lose weight or maintain a healthy weight. Your health care provider may also recommend limiting how much fluid you drink.  Most people with heart failure should eat no more than 2,300 mg of salt (sodium) a day. The amount of sodium   that is recommended for you may be lower, depending on your condition.  Contact your health care provider or dietitian before making any major changes to your diet. This information is not intended to replace advice given to you by your health care provider. Make sure you discuss any questions you have with your health care provider. Document Released: 12/15/2016 Document Revised: 09/26/2018 Document Reviewed: 12/15/2016 Elsevier Patient Education  [employer].

## 2019-06-30 NOTE — Assessment & Plan Note (Signed)
Praised for continued weight loss.  Recommend continued focus on health diet choices and regular physical activity (30 minutes 5 days a week).

## 2019-06-30 NOTE — Assessment & Plan Note (Signed)
Will obtain u/s of area to rule out inguinal hernia.  Recommend wear tight support underwear.  Return for worsening or continued.

## 2019-06-30 NOTE — Assessment & Plan Note (Signed)
Chronic, ongoing.  Continue current medication regimen and collaboration with pulmonary.  Recent noted reviewed.   

## 2019-06-30 NOTE — Assessment & Plan Note (Signed)
Chronic, stable.  Continue use of CPAP daily.

## 2019-06-30 NOTE — Assessment & Plan Note (Signed)
Continue collaboration with cardiology team at [facility]. 

## 2019-06-30 NOTE — Progress Notes (Signed)
BP 140/78 (BP Location: Right Arm, Patient Position: Sitting)   Pulse 60   Temp 98.1 F (36.7 C) (Oral)   Wt (!) 310 lb (140.6 kg)   SpO2 93%   BMI 44.48 kg/m    Subjective:    Patient ID: Thomas Calhoun, male    DOB: 09-21-1955, 63 y.o.   MRN: [id number]  HPI: Thomas Calhoun is a 63 y.o. male  Chief Complaint  Patient presents with  . Hypertension  . Hyperlipidemia  . Asthma   HYPERTENSION / HYPERLIPIDEMIA & CHF Last saw [facility] 06/17/2019 and Lisinopril was discontinued and changed to Losartan with request for PCP to check BMP next visit.  [facility] cardiology 04/15/2019.  Metoprolol, Amiodarone, Lasix, and Spironolactone at home.  Has lost a total of 43 pounds this year.   Satisfied with current treatment? yes Duration of hypertension: chronic BP monitoring frequency: a few times a week BP range: 150/80-90 BP medication side effects: no Duration of hyperlipidemia: chronic Cholesterol medication side effects: no Cholesterol supplements: none Medication compliance: good compliance Aspirin: no Recent stressors: no Recurrent headaches: no Visual changes: no Palpitations: no Dyspnea: no Chest pain: no Lower extremity edema: no Dizzy/lightheaded: no   ASTHMA Saw Dr. Raul Del on 06/19/2019 and changed Prednisone dosing to 5 MG QOD.  Recent Theophylline, level 02/04/2019 === 3.9 and continues on Nucala.  Has underlying OSA and uses CPAP 100% of the time. Asthma status: stable Satisfied with current treatment?: yes Albuterol/rescue inhaler frequency:  Dyspnea frequency: none Wheezing frequency: occasional Cough frequency: none Nocturnal symptom frequency:  Limitation of activity: no Current upper respiratory symptoms: no Triggers: seasonal Home peak flows: none Last Spirometry: with pulmonary Failed/intolerant to following asthma meds:  Asthma meds in past: unknown Aerochamber/spacer use: no Visits to ER or Urgent Care in past year: no Pneumovax: Not up to  Date Influenza: Up to Date   GROIN PAIN  To left groin area.  Been present a little more than two weeks.  Notices it when rolls over in bed on occasion or going up steps on occasion.  Does not notice it any other time.  Has had no discomfort today at all.  Denies pain with it, just mild discomfort. Duration:weeks Onset: gradual Severity: mild Quality: dull Location:  left groin  Episode duration:  Radiation: no Frequency: intermittent Alleviating factors:  Aggravating factors: Status: stable Treatments attempted: none Fever: no Nausea: no Vomiting: no Weight loss: no Decreased appetite: no Diarrhea: no Constipation: no Blood in stool: no Heartburn: no Jaundice: no Rash: no Dysuria/urinary frequency: no Hematuria: no History of sexually transmitted disease: no Recurrent NSAID use: no  Relevant past medical, surgical, family and social history reviewed and updated as indicated. Interim medical history since our last visit reviewed. Allergies and medications reviewed and updated.  Review of Systems  Constitutional: Negative for activity change, diaphoresis, fatigue and fever.  Respiratory: Positive for wheezing (intermittent per report). Negative for cough, chest tightness and shortness of breath.   Cardiovascular: Negative for chest pain, palpitations and leg swelling.  Gastrointestinal: Negative for abdominal distention, abdominal pain, constipation, diarrhea, nausea and vomiting.  Musculoskeletal: Negative.   Skin: Negative.   Neurological: Negative for dizziness, syncope, weakness, light-headedness, numbness and headaches.  Psychiatric/Behavioral: Negative.     Per HPI unless specifically indicated above     Objective:    BP 140/78 (BP Location: Right Arm, Patient Position: Sitting)   Pulse 60   Temp 98.1 F (36.7 C) (Oral)   Wt (!) 310  lb (140.6 kg)   SpO2 93%   BMI 44.48 kg/m   Wt Readings from Last 3 Encounters:  06/30/19 (!) 310 lb (140.6 kg)   03/25/19 (!) 324 lb (147 kg)  01/10/19 (!) 336 lb (152.4 kg)    Physical Exam Vitals signs and nursing note reviewed.  Constitutional:      General: He is awake. He is not in acute distress.    Appearance: He is well-developed. He is obese. He is not ill-appearing.  HENT:     Head: Normocephalic and atraumatic.     Right Ear: Hearing normal. No drainage.     Left Ear: Hearing normal. No drainage.  Eyes:     General: Lids are normal.        Right eye: No discharge.        Left eye: No discharge.     Conjunctiva/sclera: Conjunctivae normal.     Pupils: Pupils are equal, round, and reactive to light.  Neck:     Musculoskeletal: Normal range of motion and neck supple.     Thyroid: No thyromegaly.     Vascular: No carotid bruit.  Cardiovascular:     Rate and Rhythm: Normal rate and regular rhythm.     Heart sounds: Normal heart sounds, S1 normal and S2 normal. No murmur. No gallop.   Pulmonary:     Effort: Pulmonary effort is normal. No accessory muscle usage or respiratory distress.     Breath sounds: No wheezing.     Comments: No SOB with talking or cough noted. Abdominal:     General: Bowel sounds are normal.     Palpations: Abdomen is soft.     Tenderness: There is no abdominal tenderness.  Genitourinary:   Musculoskeletal: Normal range of motion.     Right lower leg: No edema.     Left lower leg: No edema.  Skin:    General: Skin is warm and dry.  Neurological:     Mental Status: He is alert and oriented to person, place, and time.     Deep Tendon Reflexes: Reflexes are normal and symmetric.  Psychiatric:        Attention and Perception: Attention normal.        Mood and Affect: Mood normal.        Speech: Speech normal.        Behavior: Behavior normal. Behavior is cooperative.        Thought Content: Thought content normal.        Judgment: Judgment normal.    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  Continue current medication regimen and collaboration with cardiology.  Notes reviewed, obtain CMP and lipid panel today.      Relevant Orders   Comprehensive metabolic panel   Chronic combined systolic and diastolic CHF (congestive heart failure) ([place])    Ongoing and stable at this time. Reviewed recent cardiology note.  Continue current medication regimen and collaboration with cardiology.  Recommend: Continue weighing daily and call for an overnight weight  gain of > 2 pounds or a weekly weight gain of >5 pounds.        Respiratory   Severe asthma    Chronic, ongoing.  Continue current medication regimen and collaboration with pulmonary.  Recent noted reviewed.        Obstructive sleep apnea    Chronic, stable.  Continue use of CPAP daily.        Other   Hyperlipidemia    Chronic, ongoing.  Continue current medication regimen and adjust as needed.  Obtain lipid panel and CMP today.        Relevant Orders   Lipid Panel w/o Chol/HDL Ratio out   Comprehensive metabolic panel   Groin pain, left    Will obtain u/s of area to rule out inguinal hernia.  Recommend wear tight support underwear.  Return for worsening or continued.      Relevant Orders   US Pelvis Limited   Severe obesity (BMI >= 40) ([facility])    Praised for continued weight loss.  Recommend continued focus on health diet choices and regular physical activity (30 minutes 5 days a week).       ICD (implantable cardioverter-defibrillator) in place    Continue collaboration with cardiology team at [facility].          Follow up plan: Return in about 6  months (around 12/28/2019) for HTN/HLD/HF and Asthma.

## 2019-06-30 NOTE — Assessment & Plan Note (Signed)
Chronic, ongoing.  Continue current medication regimen and adjust as needed.  Obtain lipid panel and CMP today.

## 2019-07-01 LAB — COMPREHENSIVE METABOLIC PANEL
ALT: 43 IU/L (ref 0–44)
AST: 41 IU/L — ABNORMAL HIGH (ref 0–40)
Albumin/Globulin Ratio: 2.2 (ref 1.2–2.2)
Albumin: 4.6 g/dL (ref 3.8–4.8)
Alkaline Phosphatase: 62 IU/L (ref 39–117)
BUN/Creatinine Ratio: 12 (ref 10–24)
BUN: 17 mg/dL (ref 8–27)
Bilirubin Total: 1.4 mg/dL — ABNORMAL HIGH (ref 0.0–1.2)
CO2: 25 mmol/L (ref 20–29)
Calcium: 9.5 mg/dL (ref 8.6–10.2)
Chloride: 100 mmol/L (ref 96–106)
Creatinine, Ser: 1.43 mg/dL — ABNORMAL HIGH (ref 0.76–1.27)
GFR calc Af Amer: 60 mL/min/{1.73_m2} (ref >59)
GFR calc non Af Amer: 52 mL/min/{1.73_m2} — ABNORMAL LOW (ref >59)
Globulin, Total: 2.1 g/dL (ref 1.5–4.5)
Glucose: 88 mg/dL (ref 65–99)
Potassium: 4.2 mmol/L (ref 3.5–5.2)
Sodium: 140 mmol/L (ref 134–144)
Total Protein: 6.7 g/dL (ref 6.0–8.5)

## 2019-07-01 LAB — LIPID PANEL W/O CHOL/HDL RATIO
Cholesterol, Total: 199 mg/dL (ref 100–199)
HDL: 64 mg/dL (ref >39)
LDL Chol Calc (NIH): 118 mg/dL — ABNORMAL HIGH (ref 0–99)
Triglycerides: 93 mg/dL (ref 0–149)
VLDL Cholesterol Cal: 17 mg/dL (ref 5–40)

## 2019-07-07 ENCOUNTER — Ambulatory Visit
Admission: RE | Admit: 2019-07-07 | Discharge: 2019-07-07 | Disposition: A | Discharge status: Home / Self Care | Payer: PRIVATE HEALTH INSURANCE | Source: Ambulatory Visit | Attending: Nurse Practitioner | Admitting: Nurse Practitioner

## 2019-07-07 ENCOUNTER — Other Ambulatory Visit: Payer: Self-pay

## 2019-07-07 DIAGNOSIS — R1032 Left lower quadrant pain: Secondary | ICD-10-CM | POA: Insufficient documentation

## 2019-08-09 IMAGING — DX PORTABLE CHEST - 1 VIEW
1 series · 1 of 1 positions shown · non-contrast
Comparison: 12/27/2018 at 5066 hours

CLINICAL DATA: Ventricular tachycardia. MI. Increased blood
pressure.

EXAM:
PORTABLE CHEST 1 VIEW

[chest ap]
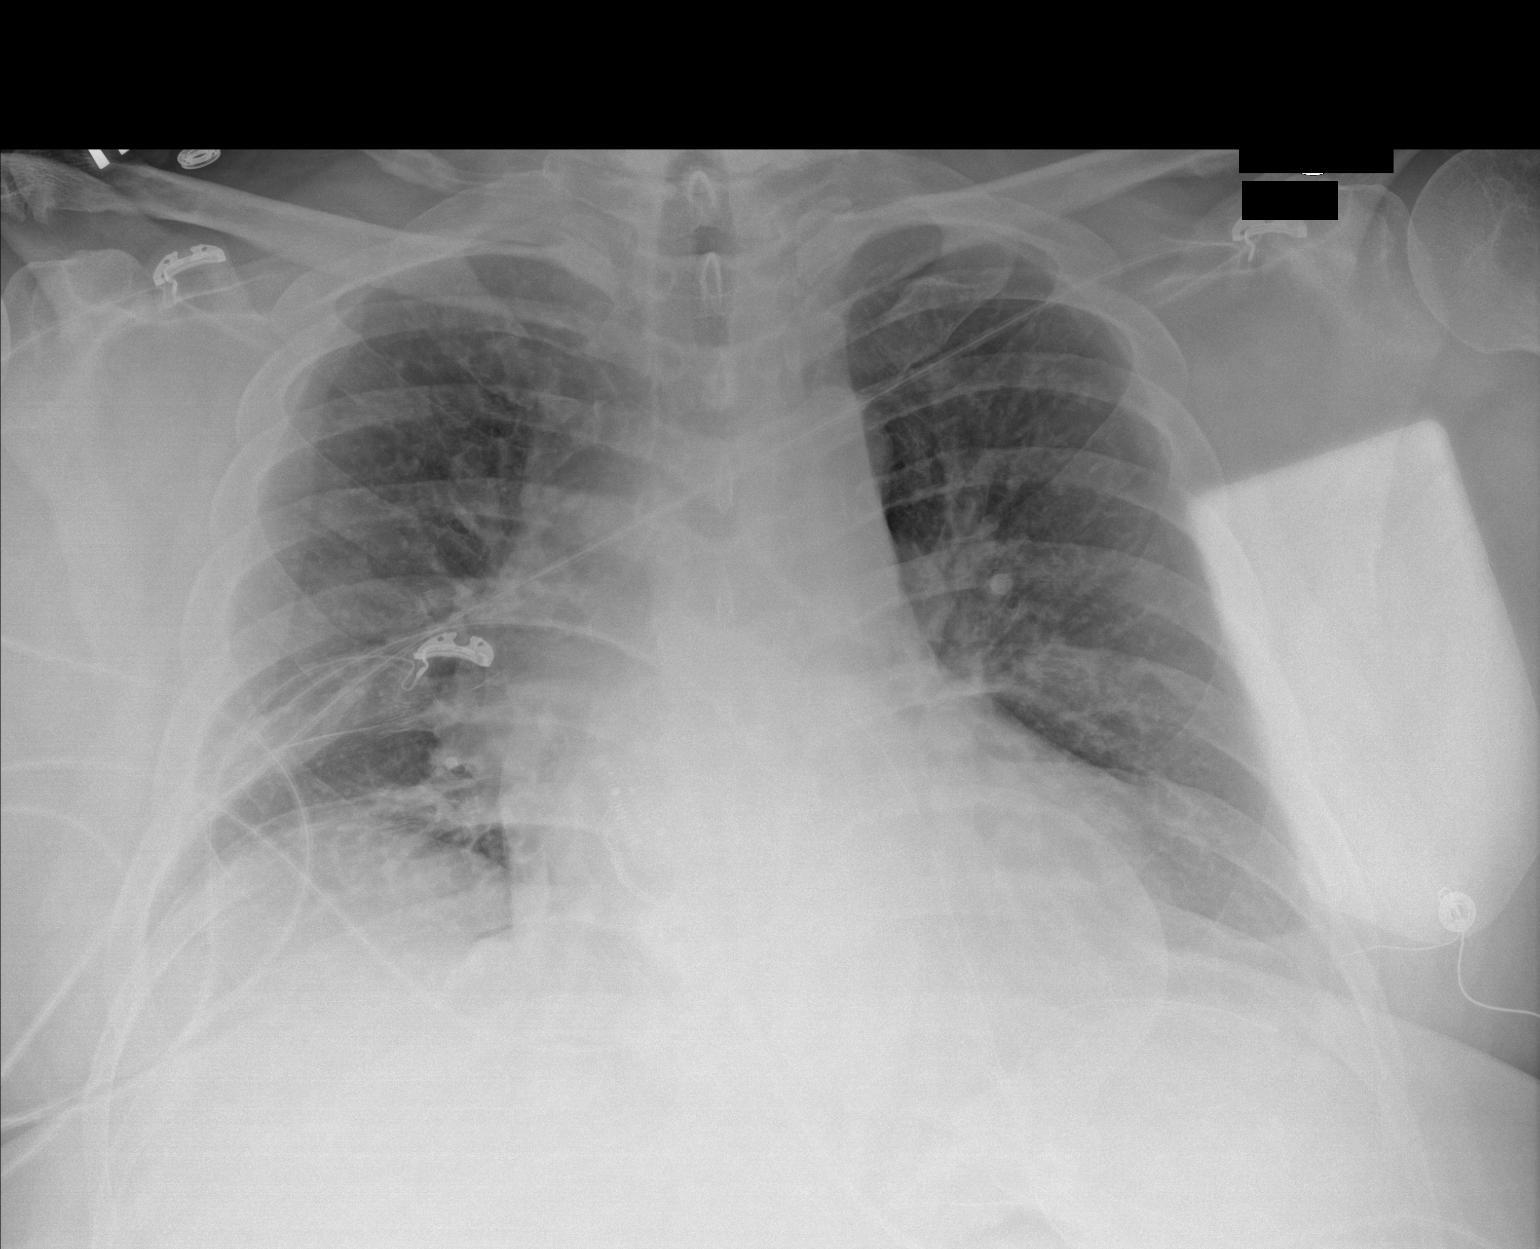

[1 of 1 positions shown; findings below may reference images not displayed]

FINDINGS: Pads and telemetry leads overlie the chest. The cardiomediastinal
silhouette is unchanged. The lungs remain hypoinflated with similar
appearance of bibasilar atelectasis. No pulmonary edema, sizable
pleural effusion, or pneumothorax is identified.
IMPRESSION: Low lung volumes with bibasilar atelectasis.

## 2019-09-20 ENCOUNTER — Encounter: Payer: Self-pay | Admitting: Nurse Practitioner

## 2019-10-20 ENCOUNTER — Other Ambulatory Visit: Payer: Self-pay

## 2019-10-20 ENCOUNTER — Encounter: Payer: Self-pay | Admitting: Nurse Practitioner

## 2019-10-20 ENCOUNTER — Ambulatory Visit (INDEPENDENT_AMBULATORY_CARE_PROVIDER_SITE_OTHER): Payer: PRIVATE HEALTH INSURANCE | Admitting: Nurse Practitioner

## 2019-10-20 ENCOUNTER — Telehealth: Payer: Self-pay

## 2019-10-20 VITALS — BP 149/70 | HR 66 | Temp 98.7°F

## 2019-10-20 DIAGNOSIS — R1032 Left lower quadrant pain: Secondary | ICD-10-CM | POA: Diagnosis not present

## 2019-10-20 DIAGNOSIS — Z1211 Encounter for screening for malignant neoplasm of colon: Secondary | ICD-10-CM

## 2019-10-20 DIAGNOSIS — Z86010 Personal history of colonic polyps: Secondary | ICD-10-CM

## 2019-10-20 NOTE — Patient Instructions (Signed)
Adductor Muscle Strain  An adductor muscle strain, also called a groin strain or pull, is an injury to the muscles or tendons on the upper, inner part of the thigh. These muscles are called the adductor muscles or groin muscles. They are responsible for moving the legs across the body or pulling the legs together. A muscle strain occurs when a muscle is overstretched and some muscle fibers are torn. An adductor muscle strain can range from mild to severe, depending on how many muscle fibers are affected and whether the muscle fibers are partially or completely torn. What are the causes? Adductor muscle strains usually occur during exercise or while participating in sports. The injury often happens when a sudden, violent force is placed on a muscle, stretching the muscle too far. A strain is more likely to happen when your muscles are not warmed up or if you are not properly conditioned. This injury may be caused by:  Stretching the adductor muscles too far or too suddenly, often during side-to-side motion with a sudden change in direction.  Putting repeated stress on the adductor muscles over a long period of time.  Performing vigorous activity without properly stretching the adductor muscles beforehand. What are the signs or symptoms? Symptoms of this condition include:  Pain and tenderness in the groin area. This begins as sharp pain and persists as a dull ache.  A popping or snapping feeling when the injury occurs (for severe strains).  Swelling or bruising.  Muscle spasms.  Weakness in the leg.  Stiffness in the groin area with decreased ability to move the affected muscles. How is this diagnosed? This condition may be diagnosed based on:  Your symptoms and a description of how the injury occurred.  A physical exam.  Imaging tests, such as: ? X-rays. These are sometimes needed to rule out a broken bone or cartilage problems. ? An ultrasound, CT scan, or MRI. These may be done  if your health care provider suspects a complete muscle tear or needs to check for other injuries. How is this treated? An adductor strain will often heal on its own. If needed, this condition may be treated with:  PRICE therapy. PRICE stands for protection of the injured area, rest, ice, pressure (compression), and elevation.  Medicines to help manage pain and swelling (anti-inflammatory medicines).  Crutches. You may be directed to use these for the first few days to minimize your pain. Depending on the severity of the muscle strain, recovery time may vary from a few weeks to several months. Severe injuries often require 4-6 weeks for recovery. In those cases, complete healing can take 4-5 months. Follow these instructions at home: PRICE Therapy   Protect the muscle from being injured again.  Rest. Do not use the strained muscle if it causes pain.  If directed, put ice on the injured area: ? Put ice in a plastic bag. ? Place a towel between your skin and the bag. ? Leave the ice on for 20 minutes, 2-3 times a day. Do this for the first 2 days after the injury.  Apply compression by wrapping the injured area with an elastic bandage as told by your health care provider.  Raise (elevate) the injured area above the level of your heart while you are sitting or lying down. General instructions  Take over-the-counter and prescription medicines only as told by your health care provider.  Walk, stretch, and do exercises as told by your health care provider. Only do these activities if   you can do so without any pain.  Follow your treatment plan as told by your health care provider. This may include: ? Physical therapy. ? Massage. ? Local electrical stimulation (transcutaneous electrical nerve stimulation, TENS). How is this prevented?  Warm up and stretch before being active.  Cool down and stretch after being active.  Give your body time to rest between periods of activity.  Make  sure to use equipment that fits you.  Be safe and responsible while being active to avoid slips and falls.  Maintain physical fitness, including: ? Proper conditioning in the adductor muscles. ? Overall strength, flexibility, and endurance. Contact a health care provider if:  You have increased pain or swelling in the affected area.  Your symptoms are not improving or they are getting worse. Summary  An adductor muscle strain, also called a groin strain or pull, is an injury to the muscles or tendons on the upper, inner part of the thigh.  A muscle strain occurs when a muscle is overstretched and some muscle fibers are torn.  Depending on the severity of the muscle strain, recovery time may vary from a few weeks to several months. This information is not intended to replace advice given to you by your health care provider. Make sure you discuss any questions you have with your health care provider. Document Revised: 11/19/2018 Document Reviewed: 12/31/2017 Elsevier Patient Education  2020 [employer]vier [employer].  

## 2019-10-20 NOTE — Assessment & Plan Note (Signed)
Improving, suspect muscular related to weight loss.  Recommend use of Tylenol at home as needed + heat and ice.  Return to office for worsening or return of consistent symptoms.  Due for colonoscopy, ordered GI referral.

## 2019-10-20 NOTE — Telephone Encounter (Signed)
Gastroenterology Pre-Procedure Review  Request Date: Tuesday March 30th Requesting Physician: Dr. [name]  PATIENT REVIEW QUESTIONS: The patient responded to the following health history questions as indicated:    1. Are you having any GI issues? no 2. Do you have a personal history of Polyps? yes (2017) 3. Do you have a family history of Colon Cancer or Polyps? no 4. Diabetes Mellitus? no 5. Joint replacements in the past 12 months?no 6. Major health problems in the past 3 months?no 7. Any artificial heart valves, MVP, or defibrillator?yes (Defibrillator)    MEDICATIONS & ALLERGIES:    Patient reports the following regarding taking any anticoagulation/antiplatelet therapy:   Plavix, Coumadin, Eliquis, Xarelto, Lovenox, Pradaxa, Brilinta, or Effient? no Aspirin? no  Patient confirms/reports the following medications:  Current Outpatient Medications  Medication Sig Dispense Refill  . acetaminophen (TYLENOL) 325 MG tablet Take 2 tablets (650 mg total) by mouth every 6 (six) hours as needed for mild pain (or Fever >/= 101).    Marland Kitchen albuterol (PROVENTIL HFA;VENTOLIN HFA) 108 (90 BASE) MCG/ACT inhaler Inhale 2 puffs into the lungs every 6 (six) hours as needed for wheezing or shortness of breath.    Marland Kitchen albuterol (PROVENTIL) (2.5 MG/3ML) 0.083% nebulizer solution Take 2.5 mg by nebulization every 6 (six) hours as needed for wheezing or shortness of breath.     Marland Kitchen amiodarone (PACERONE) 200 MG tablet Take 200 mg by mouth daily.     Marland Kitchen azelastine (ASTELIN) 0.1 % nasal spray USE 2 SPRAYS IN EACH NOSTRIL TWICE DAILY (Patient taking differently: Place 2 sprays into both nostrils 2 (two) times daily. ) 30 mL 5  . Biotin 5000 MCG CAPS Take 1 capsule by mouth daily.    . Cholecalciferol (VITAMIN D3) 125 MCG (5000 UT) CAPS Take 1 capsule by mouth daily.    . Fluticasone-Salmeterol (WIXELA INHUB) 500-50 MCG/DOSE AEPB Inhale 1 puff into the lungs 2 (two) times daily.    . furosemide (LASIX) 20 MG tablet Take 10  mg by mouth daily.    Marland Kitchen loratadine (CLARITIN) 10 MG tablet Take 10 mg by mouth daily.    Marland Kitchen losartan (COZAAR) 25 MG tablet Take 25 mg by mouth daily.    . Magnesium 200 MG TABS Take by mouth daily.     . Mepolizumab (NUCALA) 100 MG SOLR Inject 100 mg into the skin every 30 (thirty) days.     . metoprolol succinate (TOPROL-XL) 25 MG 24 hr tablet Take 25 mg by mouth daily.    . mometasone (ASMANEX) 220 MCG/INH inhaler Inhale 2 puffs into the lungs daily. Nasal wash    . montelukast (SINGULAIR) 10 MG tablet Take 10 mg by mouth at bedtime.     . Multiple Vitamin (MULTIVITAMIN) tablet Take 1 tablet by mouth daily.    . predniSONE (DELTASONE) 10 MG tablet Take 10 mg by mouth every other day.     Marland Kitchen SPIRIVA HANDIHALER 18 MCG inhalation capsule Place into inhaler and inhale daily.    Marland Kitchen spironolactone (ALDACTONE) 25 MG tablet Take 12.5 mg by mouth daily.    . theophylline (UNIPHYL) 400 MG 24 hr tablet Take 400 mg by mouth daily.   11  . vitamin C (ASCORBIC ACID) 500 MG tablet Take 500 mg by mouth daily.    . vitamin E 400 UNIT capsule Take 400 Units by mouth daily.    . Zinc Gluconate 30 MG TABS Take by mouth.     No current facility-administered medications for this visit.  Patient confirms/reports the following allergies:  Allergies  Allergen Reactions  . Amlodipine Besylate Swelling  . Moxifloxacin Hives and Itching    Other reaction(s): Unknown  . Avelox [Moxifloxacin Hcl In Nacl] Hives and Itching  . Micardis [Telmisartan] Itching  . Penicillin V Potassium Rash  . Penicillins Rash    Other reaction(s): Unknown    No orders of the defined types were placed in this encounter.   AUTHORIZATION INFORMATION Primary Insurance: 1D#: Group #:  Secondary Insurance: 1D#: Group #:  SCHEDULE INFORMATION: Date: Tuesday March 30th, 2021 Time: Location:[facility]

## 2019-10-20 NOTE — Progress Notes (Signed)
BP (!) 149/70   Pulse 66   Temp 98.7 F (37.1 C) (Oral)   SpO2 91%    Subjective:    Patient ID: Thomas Calhoun, male    DOB: [birth year], 64 y.o.   MRN: [id number]  HPI: Thomas Calhoun is a 64 y.o. male  Chief Complaint  Patient presents with  . Groin Pain    pt states he is still having groin pain from previous visit, states he had an [place] that didnt show anything   GROIN PAIN  To left groin area.  Been present on and off since November, but has improved over past few days since starting Keto.  Noticed it when rolled over in bed on occasion, before bowel movement, or going up steps on occasion.  Does not notice it any other time.  Has had no discomfort today at all.  Is overdue for colonoscopy.  Ultrasound done 07/07/2019 -- no hernia noted, negative exam. Duration:weeks Onset: gradual Severity: mild Quality: dull Location:  left groin  Episode duration:  Radiation: no Frequency: intermittent Status: improving Treatments attempted: none Fever: no Nausea: no Vomiting: no Weight loss: no Decreased appetite: no Diarrhea: no Constipation: no Blood in stool: no Heartburn: no Jaundice: no Rash: no Dysuria/urinary frequency: no Hematuria: no History of sexually transmitted disease: no Recurrent NSAID use: no   Relevant past medical, surgical, family and social history reviewed and updated as indicated. Interim medical history since our last visit reviewed. Allergies and medications reviewed and updated.  Review of Systems  Constitutional: Negative for activity change, diaphoresis, fatigue and fever.  Respiratory: Negative for cough, chest tightness and shortness of breath.   Cardiovascular: Negative for chest pain, palpitations and leg swelling.  Gastrointestinal: Negative for abdominal distention, abdominal pain, constipation, diarrhea, nausea and vomiting.  Neurological: Negative for dizziness, syncope, weakness, light-headedness, numbness and headaches.    Psychiatric/Behavioral: Negative.     Per HPI unless specifically indicated above     Objective:    BP (!) 149/70   Pulse 66   Temp 98.7 F (37.1 C) (Oral)   SpO2 91%   Wt Readings from Last 3 Encounters:  06/30/19 (!) 310 lb (140.6 kg)  03/25/19 (!) 324 lb (147 kg)  01/10/19 (!) 336 lb (152.4 kg)    Physical Exam Vitals and nursing note reviewed.  Constitutional:      General: He is awake. He is not in acute distress.    Appearance: He is well-developed. He is obese. He is not ill-appearing.  HENT:     Head: Normocephalic and atraumatic.     Right Ear: Hearing normal. No drainage.     Left Ear: Hearing normal. No drainage.  Eyes:     General: Lids are normal.        Right eye: No discharge.        Left eye: No discharge.     Conjunctiva/sclera: Conjunctivae normal.     Pupils: Pupils are equal, round, and reactive to light.  Neck:     Thyroid: No thyromegaly.     Vascular: No carotid bruit.  Cardiovascular:     Rate and Rhythm: Normal rate and regular rhythm.     Heart sounds: Normal heart sounds, S1 normal and S2 normal. No murmur. No gallop.   Pulmonary:     Effort: Pulmonary effort is normal. No accessory muscle usage or respiratory distress.     Breath sounds: No wheezing.     Comments: No SOB with talking or cough  noted. Abdominal:     General: Bowel sounds are normal.     Palpations: Abdomen is soft.     Tenderness: There is no abdominal tenderness.  Genitourinary:   Musculoskeletal:        General: Normal range of motion.     Cervical back: Normal range of motion and neck supple.     Right lower leg: No edema.     Left lower leg: No edema.  Skin:    General: Skin is warm and dry.  Neurological:     Mental Status: He is alert and oriented to person, place, and time.     Deep Tendon Reflexes: Reflexes are normal and symmetric.  Psychiatric:        Attention and Perception: Attention normal.        Mood and Affect: Mood normal.        Speech:  Speech normal.        Behavior: Behavior normal. Behavior is cooperative.     Results for orders placed or performed in visit on 06/30/19  Lipid Panel w/o Chol/HDL Ratio out  Result Value Ref Range   Cholesterol, Total 199 100 - 199 mg/dL   Triglycerides 93 0 - 149 mg/dL   HDL 64 >39 mg/dL   VLDL Cholesterol Cal 17 5 - 40 mg/dL   LDL Chol Calc (NIH) 118 (H) 0 - 99 mg/dL  Comprehensive metabolic panel  Result Value Ref Range   Glucose 88 65 - 99 mg/dL   BUN 17 8 - 27 mg/dL   Creatinine, Ser 1.43 (H) 0.76 - 1.27 mg/dL   GFR calc non Af Amer 52 (L) >59 mL/min/1.73   GFR calc Af Amer 60 >59 mL/min/1.73   BUN/Creatinine Ratio 12 10 - 24   Sodium 140 134 - 144 mmol/L   Potassium 4.2 3.5 - 5.2 mmol/L   Chloride 100 96 - 106 mmol/L   CO2 25 20 - 29 mmol/L   Calcium 9.5 8.6 - 10.2 mg/dL   Total Protein 6.7 6.0 - 8.5 g/dL   Albumin 4.6 3.8 - 4.8 g/dL   Globulin, Total 2.1 1.5 - 4.5 g/dL   Albumin/Globulin Ratio 2.2 1.2 - 2.2   Bilirubin Total 1.4 (H) 0.0 - 1.2 mg/dL   Alkaline Phosphatase 62 39 - 117 IU/L   AST 41 (H) 0 - 40 IU/L   ALT 43 0 - 44 IU/L      Assessment & Plan:   Problem List Items Addressed This Visit      Other   Groin pain, left - Primary    Improving, suspect muscular related to weight loss.  Recommend use of Tylenol at home as needed + heat and ice.  Return to office for worsening or return of consistent symptoms.  Due for colonoscopy, ordered GI referral.       Other Visit Diagnoses    Encounter for screening colonoscopy       GI referral placed.   Relevant Orders   Ambulatory referral to Gastroenterology       Follow up plan: Return if symptoms worsen or fail to improve.

## 2019-11-07 ENCOUNTER — Other Ambulatory Visit
Admission: RE | Admit: 2019-11-07 | Discharge: 2019-11-07 | Disposition: A | Discharge status: Home / Self Care | Payer: PRIVATE HEALTH INSURANCE | Source: Ambulatory Visit | Attending: Gastroenterology | Admitting: Gastroenterology

## 2019-11-07 DIAGNOSIS — Z01812 Encounter for preprocedural laboratory examination: Secondary | ICD-10-CM | POA: Diagnosis not present

## 2019-11-07 DIAGNOSIS — Z20822 Contact with and (suspected) exposure to covid-19: Secondary | ICD-10-CM | POA: Diagnosis not present

## 2019-11-07 LAB — SARS CORONAVIRUS 2 (TAT 6-24 HRS): SARS Coronavirus 2: NEGATIVE

## 2019-11-11 ENCOUNTER — Encounter
Admission: RE | Disposition: A | Discharge status: Home / Self Care | Payer: Self-pay | Source: Home / Self Care | Attending: Gastroenterology

## 2019-11-11 ENCOUNTER — Ambulatory Visit
Admission: RE | Admit: 2019-11-11 | Discharge: 2019-11-11 | Disposition: A | Discharge status: Home / Self Care | Payer: PRIVATE HEALTH INSURANCE | Attending: Gastroenterology | Admitting: Gastroenterology

## 2019-11-11 ENCOUNTER — Encounter: Payer: Self-pay | Admitting: Gastroenterology

## 2019-11-11 ENCOUNTER — Ambulatory Visit: Payer: PRIVATE HEALTH INSURANCE | Admitting: Anesthesiology

## 2019-11-11 ENCOUNTER — Other Ambulatory Visit: Payer: Self-pay

## 2019-11-11 DIAGNOSIS — Z809 Family history of malignant neoplasm, unspecified: Secondary | ICD-10-CM | POA: Insufficient documentation

## 2019-11-11 DIAGNOSIS — Z818 Family history of other mental and behavioral disorders: Secondary | ICD-10-CM | POA: Insufficient documentation

## 2019-11-11 DIAGNOSIS — R7301 Impaired fasting glucose: Secondary | ICD-10-CM | POA: Diagnosis not present

## 2019-11-11 DIAGNOSIS — Z1211 Encounter for screening for malignant neoplasm of colon: Secondary | ICD-10-CM | POA: Diagnosis present

## 2019-11-11 DIAGNOSIS — E785 Hyperlipidemia, unspecified: Secondary | ICD-10-CM | POA: Insufficient documentation

## 2019-11-11 DIAGNOSIS — K640 First degree hemorrhoids: Secondary | ICD-10-CM | POA: Diagnosis not present

## 2019-11-11 DIAGNOSIS — Z7952 Long term (current) use of systemic steroids: Secondary | ICD-10-CM | POA: Insufficient documentation

## 2019-11-11 DIAGNOSIS — Z79899 Other long term (current) drug therapy: Secondary | ICD-10-CM | POA: Diagnosis not present

## 2019-11-11 DIAGNOSIS — Z860100 Personal history of colon polyps, unspecified: Secondary | ICD-10-CM

## 2019-11-11 DIAGNOSIS — Z7951 Long term (current) use of inhaled steroids: Secondary | ICD-10-CM | POA: Insufficient documentation

## 2019-11-11 DIAGNOSIS — Z880 Allergy status to penicillin: Secondary | ICD-10-CM | POA: Insufficient documentation

## 2019-11-11 DIAGNOSIS — Z888 Allergy status to other drugs, medicaments and biological substances status: Secondary | ICD-10-CM | POA: Insufficient documentation

## 2019-11-11 DIAGNOSIS — I10 Essential (primary) hypertension: Secondary | ICD-10-CM | POA: Insufficient documentation

## 2019-11-11 DIAGNOSIS — D124 Benign neoplasm of descending colon: Secondary | ICD-10-CM | POA: Diagnosis not present

## 2019-11-11 DIAGNOSIS — G5710 Meralgia paresthetica, unspecified lower limb: Secondary | ICD-10-CM | POA: Diagnosis not present

## 2019-11-11 DIAGNOSIS — Z823 Family history of stroke: Secondary | ICD-10-CM | POA: Diagnosis not present

## 2019-11-11 DIAGNOSIS — I454 Nonspecific intraventricular block: Secondary | ICD-10-CM | POA: Diagnosis not present

## 2019-11-11 DIAGNOSIS — Z8249 Family history of ischemic heart disease and other diseases of the circulatory system: Secondary | ICD-10-CM | POA: Diagnosis not present

## 2019-11-11 DIAGNOSIS — K635 Polyp of colon: Secondary | ICD-10-CM | POA: Diagnosis not present

## 2019-11-11 DIAGNOSIS — J45909 Unspecified asthma, uncomplicated: Secondary | ICD-10-CM | POA: Diagnosis not present

## 2019-11-11 HISTORY — DX: Presence of automatic (implantable) cardiac defibrillator: Z95.810

## 2019-11-11 HISTORY — PX: COLONOSCOPY WITH PROPOFOL: SHX5780

## 2019-11-11 SURGERY — COLONOSCOPY WITH PROPOFOL
Anesthesia: General

## 2019-11-11 MED ORDER — SODIUM CHLORIDE 0.9 % IV SOLN: INTRAVENOUS | Status: DC

## 2019-11-11 MED ORDER — LIDOCAINE HCL (CARDIAC) PF 100 MG/5ML IV SOSY
PREFILLED_SYRINGE | INTRAVENOUS | Status: DC | PRN
  Administered 2019-11-11 (×2): 50 mg via INTRAVENOUS

## 2019-11-11 MED ORDER — PROPOFOL 10 MG/ML IV BOLUS
INTRAVENOUS | Status: DC | PRN
  Administered 2019-11-11: 30 mg via INTRAVENOUS
  Administered 2019-11-11: 20 mg via INTRAVENOUS

## 2019-11-11 MED ORDER — PROPOFOL 500 MG/50ML IV EMUL
INTRAVENOUS | Status: DC | PRN
  Administered 2019-11-11: 125 ug/kg/min via INTRAVENOUS

## 2019-11-11 MED ORDER — SODIUM CHLORIDE 0.9 % IV SOLN: INTRAVENOUS | Status: DC | PRN

## 2019-11-11 MED ORDER — PROPOFOL 500 MG/50ML IV EMUL
INTRAVENOUS | Status: AC
  Filled 2019-11-11: qty 50

## 2019-11-11 MED ORDER — EPHEDRINE SULFATE 50 MG/ML IJ SOLN
INTRAMUSCULAR | Status: DC | PRN
  Administered 2019-11-11: 10 mg via INTRAVENOUS

## 2019-11-11 NOTE — OR Nursing (Signed)
PT HAS EKG CHANGES AND DR Ola Spurr IS EVALUATING . CARDIOLOGIST DR END CALLED HE REFERRED TO [facility].NO CHEST PAIN ETC.

## 2019-11-11 NOTE — Transfer of Care (Signed)
Immediate Anesthesia Transfer of Care Note  Patient: LYNK LABERGE  Procedure(s) Performed: COLONOSCOPY WITH PROPOFOL (N/A )  Patient Location: PACU and Endoscopy Unit  Anesthesia Type:General  Level of Consciousness: awake, alert  and oriented  Airway & Oxygen Therapy: Patient Spontanous Breathing  Post-op Assessment: Report given to RN and Post -op Vital signs reviewed and stable  Post vital signs: Reviewed and stable  Last Vitals:  Vitals Value Taken Time  BP 109/65 11/11/19 0930  Temp    Pulse 59 11/11/19 0930  Resp 20 11/11/19 0930  SpO2 99 % 11/11/19 0930    Last Pain:  Vitals:   11/11/19 0821  TempSrc: Temporal  PainSc: 0-No pain         Complications: No apparent anesthesia complications

## 2019-11-11 NOTE — OR Nursing (Signed)
DR CALLWOOD IN TO CHECK PT. HE EXPLAINED THAT HIS HEART HAS DIFFERENT AREAS THAT CONTROL THE PACE AND BEAT. TO FOLLOW UP WITH DR Lonia Blood AT [facility].

## 2019-11-11 NOTE — Anesthesia Preprocedure Evaluation (Addendum)
Anesthesia Evaluation  Patient identified by MRN, date of birth, ID band Patient awake    Reviewed: Allergy & Precautions, H&P , NPO status , Patient's Chart, lab work & pertinent test results  History of Anesthesia Complications Negative for: history of anesthetic complications  Airway Mallampati: III  TM Distance: >3 FB     Dental  (+) Teeth Intact   Pulmonary asthma , sleep apnea ,     + decreased breath sounds      Cardiovascular hypertension, (-) angina+CHF  + dysrhythmias Ventricular Tachycardia + Cardiac Defibrillator (placed for secondary prevention due to h/o VT)  Rhythm:regular Rate:Normal  1. The left ventricle has mild-moderately reduced systolic function, with  an ejection fraction of 40-45%. The cavity size was mildly dilated. Left  ventricular diastolic Doppler parameters are consistent with impaired  relaxation.  2. The right ventricle has normal systolic function. The cavity was  mildly enlarged. There is no increase in right ventricular wall thickness.  Right ventricular systolic pressure is mildly elevated with an estimated  pressure of 24.9 mmHg.  3. Left atrial size was mildly dilated.  4. Right atrial size was mildly dilated.  5. The pericardium was not assessed.  6. The mitral valve is grossly normal.  7. The interatrial septum was not well visualized.    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  Past Surgical  History: No date: APPENDECTOMY 07/18/2016: COLONOSCOPY WITH PROPOFOL; N/A     Comment:  Procedure: COLONOSCOPY WITH PROPOFOL;  Surgeon: [name], MD;  Location: [facility] ENDOSCOPY;  Service: Endoscopy;              Laterality: N/A; 2018: INNER EAR SURGERY; Left     Comment:  repaird ear infection in the ear drum 12/27/2018: LEFT HEART CATH AND CORONARY ANGIOGRAPHY; N/A     Comment:  Procedure: LEFT HEART CATH AND CORONARY ANGIOGRAPHY               possible PCI and stent;  Surgeon: [name], MD;              Location: [facility];  Service: Cardiovascular;              Laterality: N/A; No date: NASAL SINUS SURGERY No date: SMALL INTESTINE SURGERY     Reproductive/Obstetrics negative OB ROS                           Anesthesia Physical Anesthesia Plan  ASA: III  Anesthesia Plan: General   Post-op Pain Management:    Induction:   PONV Risk Score and Plan: Propofol infusion and TIVA  Airway Management Planned:   Additional Equipment:   Intra-op Plan:   Post-operative Plan:   Informed Consent: I have reviewed the patients History and Physical, chart, labs and discussed the procedure including the risks, benefits and alternatives for the proposed anesthesia with the patient or authorized representative who has indicated his/her understanding  and acceptance.     Dental Advisory Given  Plan Discussed with: Anesthesiologist  Anesthesia Plan Comments:         Anesthesia Quick Evaluation

## 2019-11-11 NOTE — Op Note (Signed)
[facility] Gastroenterology Patient Name: Thomas Calhoun Procedure Date: 11/11/2019 8:42 AM MRN: [id number] Account #: [id number] Date of Birth: Jul 09, 1956 Admit Type: Outpatient Age: 64 Room: [facility] ENDO ROOM 4 Gender: Male Note Status: Finalized Procedure:             Colonoscopy Indications:           High risk colon cancer surveillance: Personal history                         of colonic polyps Providers:             [name] MD, MD Medicines:             Propofol per Anesthesia Complications:         No immediate complications. Procedure:             Pre-Anesthesia Assessment:                        - Prior to the procedure, a History and Physical was                         performed, and patient medications and allergies were                         reviewed. The patient's tolerance of previous                         anesthesia was also reviewed. The risks and benefits                         of the procedure and the sedation options and risks                         were discussed with the patient. All questions were                         answered, and informed consent was obtained. Prior                         Anticoagulants: The patient has taken no previous                         anticoagulant or antiplatelet agents. ASA Grade                         Assessment: III - A patient with severe systemic                         disease. After reviewing the risks and benefits, the                         patient was deemed in satisfactory condition to                         undergo the procedure.                        After obtaining informed consent, the colonoscope was  passed under direct vision. Throughout the procedure,                         the patient's blood pressure, pulse, and oxygen                         saturations were monitored continuously. The                         Colonoscope was introduced through the  anus and                         advanced to the the cecum, identified by appendiceal                         orifice and ileocecal valve. The colonoscopy was                         performed without difficulty. The patient tolerated                         the procedure well. The quality of the bowel                         preparation was excellent. Findings:      The perianal and digital rectal examinations were normal.      A 4 mm polyp was found in the descending colon. The polyp was sessile.       The polyp was removed with a cold snare. Resection and retrieval were       complete.      Non-bleeding internal hemorrhoids were found during retroflexion. The       hemorrhoids were Grade I (internal hemorrhoids that do not prolapse). Impression:            - One 4 mm polyp in the descending colon, removed with                         a cold snare. Resected and retrieved.                        - Non-bleeding internal hemorrhoids. Recommendation:        - Discharge patient to home.                        - Resume previous diet.                        - Continue present medications. Procedure Code(s):     --- Professional ---                        [phone], Colonoscopy, flexible; with removal of                         tumor(s), polyp(s), or other lesion(s) by snare                         technique Diagnosis Code(s):     --- Professional ---  Z86.010, Personal history of colonic polyps                        K63.5, Polyp of colon CPT copyright 2019 American [employer]. All rights reserved. The codes documented in this report are preliminary and upon coder review may  be revised to meet current compliance requirements. [name] MD, MD 11/11/2019 9:21:46 AM This report has been signed electronically. Number of Addenda: 0 Note Initiated On: 11/11/2019 8:42 AM Scope Withdrawal Time: 0 hours 11 minutes 41 seconds  Total Procedure Duration: 0 hours 16  minutes 29 seconds  Estimated Blood Loss:  Estimated blood loss: none.      [facility]

## 2019-11-11 NOTE — H&P (Signed)
Thomas Lame, MD [facility] [address], [place], [place] [address] Phone:[phone] Fax : [phone]  Primary Care Physician:  Thomas Lick, NP Primary Gastroenterologist:  Dr. Allen Calhoun  Pre-Procedure History & Physical: HPI:  Thomas Calhoun is a 64 y.o. male is here for an colonoscopy.   Past Medical History:  Diagnosis Date  . Abnormal serum enzyme level   . Asthma   . Hyperlipidemia   . Hypertension   . IFG (impaired fasting glucose)   . Meralgia paresthetica   . Testicular hypofunction     Past Surgical History:  Procedure Laterality Date  . APPENDECTOMY    . COLONOSCOPY WITH PROPOFOL N/A 07/18/2016   Procedure: COLONOSCOPY WITH PROPOFOL;  Surgeon: Thomas Lame, MD;  Location: [facility] ENDOSCOPY;  Service: Endoscopy;  Laterality: N/A;  . INNER EAR SURGERY Left 2018   repaird ear infection in the ear drum  . LEFT HEART CATH AND CORONARY ANGIOGRAPHY N/A 12/27/2018   Procedure: LEFT HEART CATH AND CORONARY ANGIOGRAPHY possible PCI and stent;  Surgeon: Thomas Kida, MD;  Location: [place] [facility];  Service: Cardiovascular;  Laterality: N/A;  . NASAL SINUS SURGERY    . SMALL INTESTINE SURGERY      Prior to Admission medications   Medication Sig Start Date End Date Taking? Authorizing Provider  acetaminophen (TYLENOL) 325 MG tablet Take 2 tablets (650 mg total) by mouth every 6 (six) hours as needed for mild pain (or Fever >/= 101). 12/27/18   [name], NP  albuterol (PROVENTIL HFA;VENTOLIN HFA) 108 (90 BASE) MCG/ACT inhaler Inhale 2 puffs into the lungs every 6 (six) hours as needed for wheezing or shortness of breath.    [provider]  albuterol (PROVENTIL) (2.5 MG/3ML) 0.083% nebulizer solution Take 2.5 mg by nebulization every 6 (six) hours as needed for wheezing or shortness of breath.  03/09/16   [provider]  amiodarone (PACERONE) 200 MG tablet Take 200 mg by mouth daily.  01/02/19   [provider]  azelastine  (ASTELIN) 0.1 % nasal spray USE 2 SPRAYS IN EACH NOSTRIL TWICE DAILY Patient taking differently: Place 2 sprays into both nostrils 2 (two) times daily.  07/08/18   Thomas Maple, MD  Biotin 5000 MCG CAPS Take 1 capsule by mouth daily.    [provider]  Cholecalciferol (VITAMIN D3) 125 MCG (5000 UT) CAPS Take 1 capsule by mouth daily.    [provider]  Fluticasone-Salmeterol (WIXELA INHUB) 500-50 MCG/DOSE AEPB Inhale 1 puff into the lungs 2 (two) times daily.    [provider]  furosemide (LASIX) 20 MG tablet Take 10 mg by mouth daily. 01/15/19   [provider]  loratadine (CLARITIN) 10 MG tablet Take 10 mg by mouth daily.    [provider]  losartan (COZAAR) 25 MG tablet Take 25 mg by mouth daily.    [provider]  Magnesium 200 MG TABS Take by mouth daily.     [provider]  Mepolizumab (NUCALA) 100 MG SOLR Inject 100 mg into the skin every 30 (thirty) days.  08/08/18   [provider]  metoprolol succinate (TOPROL-XL) 25 MG 24 hr tablet Take 25 mg by mouth daily.    [provider]  mometasone [facility]) 220 MCG/INH inhaler Inhale 2 puffs into the lungs daily. Nasal wash    [provider]  montelukast (SINGULAIR) 10 MG tablet Take 10 mg by mouth at bedtime.  01/19/15   [provider]  Multiple Vitamin (MULTIVITAMIN) tablet Take 1 tablet by mouth daily.    [provider]  predniSONE (DELTASONE) 10 MG tablet Take 10 mg by mouth every other day.  02/09/17   [provider]  SPIRIVA HANDIHALER 18 MCG inhalation capsule Place into inhaler and inhale daily. 09/25/15   [provider]  spironolactone (ALDACTONE) 25 MG tablet Take 12.5 mg by mouth daily. 01/15/19   [provider]  theophylline (UNIPHYL) 400 MG 24 hr tablet Take 400 mg by mouth daily.  12/03/17   [provider]  vitamin C (ASCORBIC ACID) 500 MG tablet Take 500 mg by mouth daily.     [provider]  vitamin E 400 UNIT capsule Take 400 Units by mouth daily.    [provider]  Zinc Gluconate 30 MG TABS Take by mouth.    [provider]    Allergies as of 10/20/2019 - Review Complete 10/20/2019  Allergen Reaction Noted  . Amlodipine besylate Swelling 05/04/2015  . Moxifloxacin Hives and Itching 01/01/2014  . Avelox [moxifloxacin hcl in nacl] Hives and Itching 05/04/2015  . Micardis [telmisartan] Itching 04/03/2017  . Penicillin v potassium Rash 05/04/2015  . Penicillins Rash 01/01/2014    Family History  Problem Relation Age of Onset  . Heart disease Mother   . Hypertension Mother   . Cancer Father        melanoma  . Stroke Father        x2  . Hypertension Son   . ADD / ADHD Son     Social History   Socioeconomic History  . Marital status: Married    Spouse name: Not on file  . Number of children: Not on file  . Years of education: Not on file  . Highest education level: Not on file  Occupational History  . Not on file  Tobacco Use  . Smoking status: Never Smoker  . Smokeless tobacco: Never Used  Substance and Sexual Activity  . Alcohol use: Yes    Alcohol/week: 0.0 standard drinks    Comment: occasional  . Drug use: No  . Sexual activity: Never  Other Topics Concern  . Not on file  Social History Narrative  . Not on file   Social Determinants of Health   Financial Resource Strain: Low Risk   . Difficulty of Paying Living Expenses: Not hard at all  Food Insecurity:   . Worried About Charity fundraiser in the Last Year:   . Arboriculturist in the Last Year:   Transportation Needs: No Transportation Needs  . Lack of Transportation (Medical): No  . Lack of Transportation (Non-Medical): No  Physical Activity:   . Days of Exercise per Week:   . Minutes of Exercise per Session:   Stress: No Stress Concern Present  . Feeling of Stress : Not at all  Social Connections:   . Frequency of Communication with  Friends and Family:   . Frequency of Social Gatherings with Friends and Family:   . Attends Religious Services:   . Active Member of Clubs or Organizations:   . Attends Archivist Meetings:   Marland Kitchen Marital Status:   Intimate Partner Violence:   . Fear of Current or Ex-Partner:   . Emotionally Abused:   Marland Kitchen Physically Abused:   . Sexually Abused:     Review of Systems: See HPI, otherwise negative ROS  Physical Exam: There were no vitals taken for this visit. General:   Alert,  pleasant and cooperative in NAD Head:  Normocephalic and atraumatic. Neck:  Supple; no masses or thyromegaly. Lungs:  Clear throughout to auscultation.    Heart:  Regular rate and rhythm. Abdomen:  Soft, nontender and nondistended. Normal bowel sounds, without guarding, and without rebound.   Neurologic:  Alert and  oriented x4;  grossly normal neurologically.  Impression/Plan: BROUGHTON SILOS is here for an colonoscopy to be performed for history of adenomatous polyps 07/2016  Risks, benefits, limitations, and alternatives regarding  colonoscopy have been reviewed with the patient.  Questions have been answered.  All parties agreeable.   Thomas Lame, MD  11/11/2019, 8:12 AM

## 2019-11-11 NOTE — Anesthesia Postprocedure Evaluation (Signed)
Anesthesia Post Note  Patient: Thomas Calhoun  Procedure(s) Performed: COLONOSCOPY WITH PROPOFOL (N/A )  Patient location during evaluation: PACU Anesthesia Type: General Level of consciousness: awake and alert Pain management: pain level controlled Vital Signs Assessment: post-procedure vital signs reviewed and stable Respiratory status: spontaneous breathing, nonlabored ventilation and respiratory function stable Cardiovascular status: blood pressure returned to baseline and stable Postop Assessment: no apparent nausea or vomiting Anesthetic complications: no     Last Vitals:  Vitals:   11/11/19 1040 11/11/19 1050  BP: (!) 158/91 (!) 161/89  Pulse: (!) 59 (!) 59  Resp: 16 15  Temp:    SpO2: 99% 99%    Last Pain:  Vitals:   11/11/19 0821  TempSrc: Temporal  PainSc: 0-No pain    Pt was having morphology changes on his telemetry intermittently during and after procedure that appeared like a new LBBB or ventricular paced beat.  Stable and asymptomatic.  Pt denied a history of being paced or of known LBBB.  12-lead EKG obtained.  Cardiology saw the pt and confirmed that pt was being A-paced and intermittently A-V paced.  This was discussed with the patient.               Tera Mater

## 2019-11-12 ENCOUNTER — Encounter: Payer: Self-pay | Admitting: *Deleted

## 2019-11-12 ENCOUNTER — Encounter: Payer: Self-pay | Admitting: Gastroenterology

## 2019-11-12 LAB — SURGICAL PATHOLOGY

## 2019-12-29 ENCOUNTER — Other Ambulatory Visit: Payer: Self-pay

## 2019-12-29 ENCOUNTER — Encounter: Payer: Self-pay | Admitting: Nurse Practitioner

## 2019-12-29 ENCOUNTER — Ambulatory Visit (INDEPENDENT_AMBULATORY_CARE_PROVIDER_SITE_OTHER): Payer: PRIVATE HEALTH INSURANCE | Admitting: Nurse Practitioner

## 2019-12-29 VITALS — BP 138/78 | HR 60 | Temp 97.7°F | Wt 281.8 lb

## 2019-12-29 DIAGNOSIS — E782 Mixed hyperlipidemia: Secondary | ICD-10-CM

## 2019-12-29 DIAGNOSIS — I5042 Chronic combined systolic (congestive) and diastolic (congestive) heart failure: Secondary | ICD-10-CM

## 2019-12-29 DIAGNOSIS — N1831 Chronic kidney disease, stage 3a: Secondary | ICD-10-CM | POA: Insufficient documentation

## 2019-12-29 DIAGNOSIS — I110 Hypertensive heart disease with heart failure: Secondary | ICD-10-CM

## 2019-12-29 DIAGNOSIS — G4733 Obstructive sleep apnea (adult) (pediatric): Secondary | ICD-10-CM

## 2019-12-29 DIAGNOSIS — J709 Respiratory conditions due to unspecified external agent: Secondary | ICD-10-CM

## 2019-12-29 DIAGNOSIS — J4550 Severe persistent asthma, uncomplicated: Secondary | ICD-10-CM

## 2019-12-29 NOTE — Assessment & Plan Note (Signed)
Chronic, ongoing.  Continue current medication regimen and collaboration with pulmonary.  Recent noted reviewed.   

## 2019-12-29 NOTE — Assessment & Plan Note (Signed)
Chronic, ongoing with BP close to goal range today.  Continue current medication regimen and collaboration with cardiology.  Notes reviewed, obtain BMP and lipid panel today.  Discussed with him possible benefit of adding on statin if levels remain elevated.  Return in 6 months.

## 2019-12-29 NOTE — Assessment & Plan Note (Signed)
Chronic, stable, no decline on recent labs.  Continue Losartan for kidney protection.  BMP today.

## 2019-12-29 NOTE — Assessment & Plan Note (Signed)
Praised for continued weight loss.  Recommend continued focus on health diet choices and regular physical activity (30 minutes 5 days a week).  Continue collaboration with Dr. Enrigue Catena.

## 2019-12-29 NOTE — Progress Notes (Signed)
BP 138/78 (BP Location: Right Arm, Cuff Size: Large)   Pulse 60   Temp 97.7 F (36.5 C) (Oral)   Wt 281 lb 12.8 oz (127.8 kg)   SpO2 95%   BMI 40.43 kg/m    Subjective:    Patient ID: Thomas Calhoun, male    DOB: November 06, 1955, 64 y.o.   MRN: [id number]  HPI: MAXIMOUS SUIRE is a 64 y.o. male  Chief Complaint  Patient presents with  . Hyperlipidemia  . Hypertension  . Asthma    HYPERTENSION / HYPERLIPIDEMIA & CHF  Last saw [facility] 12/16/19, no changes made. Continues Metoprolol, Amiodarone, Lasix, Losartan and Spironolactone at home. Has lost a total of 30 pounds over the past months, continues to work with Dr. Enrigue Catena.  Satisfied with current treatment? yes  Duration of hypertension: chronic  BP monitoring frequency: rarely  BP range: 130/80-70  BP medication side effects: no  Duration of hyperlipidemia: chronic  Cholesterol medication side effects: no  Cholesterol supplements: none  Medication compliance: good compliance  Aspirin: no  Recent stressors: no  Recurrent headaches: no  Visual changes: no  Palpitations: no  Dyspnea: no  Chest pain: no  Lower extremity edema: no  Dizzy/lightheaded: no  The 10-year ASCVD risk score Mikey Bussing DC Jr., et al., 2013) is: 11.6%  Values used to calculate the score:  Age: 28 years  Sex: Male  Is Non-Hispanic African American: No  Diabetic: No  Tobacco smoker: No  Systolic Blood Pressure: 0000000 mmHg  Is BP treated: Yes  HDL Cholesterol: 77 mg/dL  Total Cholesterol: 212 mg/dL   ASTHMA  Saw Dr. Raul Del on 06/19/2019 and . Recent Theophylline, level 02/04/2019 === 3.9 and continues on Nucala and Prednisone. Has underlying OSA and uses CPAP 100% of the time.  Asthma status: stable  Satisfied with current treatment?: yes  Albuterol/rescue inhaler frequency:  Dyspnea frequency: none  Wheezing frequency: occasional  Cough frequency: none  Nocturnal symptom frequency:  Limitation of activity: no  Current upper  respiratory symptoms: no  Triggers: seasonal  Home peak flows: none  Last Spirometry: with pulmonary  Failed/intolerant to following asthma meds:  Asthma meds in past: unknown  Aerochamber/spacer use: no  Visits to ER or Urgent Care in past year: no  Pneumovax: Not up to Date  Influenza: Up to Date   Relevant past medical, surgical, family and social history reviewed and updated as indicated. Interim medical history since our last visit reviewed. Allergies and medications reviewed and updated.  Review of Systems  Constitutional: Negative for activity change, diaphoresis, fatigue and fever.  Respiratory: Negative for cough, chest tightness, shortness of breath and wheezing.   Cardiovascular: Negative for chest pain, palpitations and leg swelling.  Gastrointestinal: Negative.   Neurological: Negative.   Psychiatric/Behavioral: Negative.     Per HPI unless specifically indicated above     Objective:    BP 138/78 (BP Location: Right Arm, Cuff Size: Large)   Pulse 60   Temp 97.7 F (36.5 C) (Oral)   Wt 281 lb 12.8 oz (127.8 kg)   SpO2 95%   BMI 40.43 kg/m   Wt Readings from Last 3 Encounters:  12/29/19 281 lb 12.8 oz (127.8 kg)  11/11/19 298 lb (135.2 kg)  06/30/19 (!) 310 lb (140.6 kg)    Physical Exam Vitals and nursing note reviewed.  Constitutional:      General: He is awake. He is not in acute distress.    Appearance: He is well-developed. He is  morbidly obese. He is not ill-appearing.  HENT:     Head: Normocephalic and atraumatic.     Right Ear: Hearing normal. No drainage.     Left Ear: Hearing normal. No drainage.  Eyes:     General: Lids are normal.        Right eye: No discharge.        Left eye: No discharge.     Conjunctiva/sclera: Conjunctivae normal.     Pupils: Pupils are equal, round, and reactive to light.  Neck:     Thyroid: No thyromegaly.     Vascular: No carotid bruit.     Trachea: Trachea normal.  Cardiovascular:     Rate and Rhythm:  Normal rate and regular rhythm.     Heart sounds: Normal heart sounds, S1 normal and S2 normal. No murmur. No gallop.   Pulmonary:     Effort: Pulmonary effort is normal. No accessory muscle usage or respiratory distress.     Breath sounds: Normal breath sounds.  Abdominal:     General: Bowel sounds are normal.     Palpations: Abdomen is soft.  Musculoskeletal:        General: Normal range of motion.     Cervical back: Normal range of motion and neck supple.     Right lower leg: No edema.     Left lower leg: No edema.  Skin:    General: Skin is warm and dry.     Capillary Refill: Capillary refill takes less than 2 seconds.  Neurological:     Mental Status: He is alert and oriented to person, place, and time.  Psychiatric:        Attention and Perception: Attention normal.        Mood and Affect: Mood normal.        Speech: Speech normal.        Behavior: Behavior normal. Behavior is cooperative.        Thought Content: Thought content normal.     Results for orders placed or performed during the hospital encounter of 11/11/19  Surgical pathology  Result Value Ref Range   SURGICAL PATHOLOGY      SURGICAL PATHOLOGY CASE: [id number] PATIENT: Eveline Keto Surgical Pathology Report     Specimen Submitted: A. Colon polyp, descending; cold snare  Clinical History: Personal history of colon polyps. Colon polyp.      DIAGNOSIS: A. COLON POLYP, DESCENDING; COLD SNARE: - COLONIC MUCOSA WITH INTRAMUCOSAL LYMPHOID AGGREGATES. - NEGATIVE FOR DYSPLASIA AND MALIGNANCY.  Comment: Step sections are examined.   GROSS DESCRIPTION: A. Labeled: Cold snare descending colon polyp Received: In formalin Tissue fragment(s): Multiple Size: 0.9 x 0.5 x 0.1 cm Description: Aggregate of tan tissue fragments Entirely submitted in 1 cassette.   Final Diagnosis performed by Betsy Pries, MD.   Electronically signed 11/12/2019 1:12:23PM The electronic signature indicates that  the named Attending Pathologist has evaluated the specimen Technical component performed at [facility], [address] [place], [place] [address] Lab: [phone] Dir: Rush Farmer, MD, MMM  Pr ofessional component performed at [facility], [facility], [place], [place], [place] [address] Lab: [phone] Dir: Dellia Nims. 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  Continue current medication regimen and collaboration with  cardiology.  Notes reviewed, obtain BMP and lipid panel today.  Discussed with him possible benefit of adding on statin if levels remain elevated.  Return in 6 months.      Relevant Orders   Lipid Panel w/o Chol/HDL Ratio   Chronic combined systolic and diastolic CHF (congestive heart failure) ([place])    Ongoing and stable at this time. Reviewed recent cardiology note.  Continue current medication regimen and collaboration with cardiology.  Recommend: - Reminded to call for an overnight weight gain of >2 pounds or a weekly weight weight of >5 pounds - not adding salt to his food and has been reading food labels. Reviewed the importance of keeping daily sodium intake to 2000mg  daily         Respiratory   Severe asthma    Chronic, ongoing.  Continue current medication regimen and collaboration with pulmonary.  Recent noted reviewed.        Relevant Orders   CBC with Differential/Platelet   Extrinsic allergic respiratory disease (HCC)    Chronic, ongoing,  Followed by cardiology.  Continue this collaboration and current medication regimen as prescribed by pulmonary.        Obstructive sleep apnea    Chronic, stable.  Continue use of CPAP daily.        Genitourinary   Stage 3a chronic kidney disease    Chronic, stable, no decline on recent labs.   Continue Losartan for kidney protection.  BMP today.      Relevant Orders   Basic metabolic panel     Other   Hyperlipidemia    Chronic, ongoing.  Continue fish oil and consider addition of statin if LDL remains elevated on this lab check, current ASCVD 11.6%.  Obtain lipid panel today.      Severe obesity (BMI >= 40) (HCC)    Praised for continued weight loss.  Recommend continued focus on health diet choices and regular physical activity (30 minutes 5 days a week).  Continue collaboration with Dr. Enrigue Catena.           Follow up plan: Return in about 6 months (around 06/30/2020) for Annual physical.

## 2019-12-29 NOTE — Assessment & Plan Note (Signed)
Chronic, ongoing,  Followed by cardiology.  Continue this collaboration and current medication regimen as prescribed by pulmonary.

## 2019-12-29 NOTE — Assessment & Plan Note (Signed)
Chronic, stable.  Continue use of CPAP daily.

## 2019-12-29 NOTE — Patient Instructions (Signed)
Heart Failure Eating Plan Heart failure, also called congestive heart failure, occurs when your heart does not pump blood well enough to meet your body's needs for oxygen-rich blood. Heart failure is a long-term (chronic) condition. Living with heart failure can be challenging. However, following your health care provider's instructions about a healthy lifestyle and working with a diet and nutrition specialist (dietitian) to choose the right foods may help to improve your symptoms. What are tips for following this plan? Reading food labels  Check food labels for the amount of sodium per serving. Choose foods that have less than 140 mg (milligrams) of sodium in each serving.  Check food labels for the number of calories per serving. This is important if you need to limit your daily calorie intake to lose weight.  Check food labels for the serving size. If you eat more than one serving, you will be eating more sodium and calories than what is listed on the label.  Look for foods that are labeled as "sodium-free," "very low sodium," or "low sodium." ? Foods labeled as "reduced sodium" or "lightly salted" may still have more sodium than what is recommended for you. Cooking  Avoid adding salt when cooking. Ask your health care provider or dietitian before using salt substitutes.  Season food with salt-free seasonings, spices, or herbs. Check the label of seasoning mixes to make sure they do not contain salt.  Cook with heart-healthy oils, such as olive, canola, soybean, or sunflower oil.  Do not fry foods. Cook foods using low-fat methods, such as baking, boiling, grilling, and broiling.  Limit unhealthy fats when cooking by: ? Removing the skin from poultry, such as chicken. ? Removing all visible fats from meats. ? Skimming the fat off from stews, soups, and gravies before serving them. Meal planning   Limit your intake of: ? Processed, canned, or pre-packaged foods. ? Foods that are  high in trans fat, such as fried foods. ? Sweets, desserts, sugary drinks, and other foods with added sugar. ? Full-fat dairy products, such as whole milk.  Eat a balanced diet that includes: ? 4-5 servings of fruit each day and 4-5 servings of vegetables each day. At each meal, try to fill half of your plate with fruits and vegetables. ? Up to 6-8 servings of whole grains each day. ? Up to 2 servings of lean meat, poultry, or fish each day. One serving of meat is equal to 3 oz. This is about the same size as a deck of cards. ? 2 servings of low-fat dairy each day. ? Heart-healthy fats. Healthy fats called omega-3 fatty acids are found in foods such as flaxseed and cold-water fish like sardines, salmon, and mackerel.  Aim to eat 25-35 g (grams) of fiber a day. Foods that are high in fiber include apples, broccoli, carrots, beans, peas, and whole grains.  Do not add salt or condiments that contain salt (such as soy sauce) to foods before eating.  When eating at a restaurant, ask that your food be prepared with less salt or no salt, if possible.  Try to eat 2 or more vegetarian meals each week.  Eat more home-cooked food and eat less restaurant, buffet, and fast food. General information  Do not eat more than 2,300 mg of salt (sodium) a day. The amount of sodium that is recommended for you may be lower, depending on your condition.  Maintain a healthy body weight as directed. Ask your health care provider what a healthy weight is  for you. ? Check your weight every day. ? Work with your health care provider and dietitian to make a plan that is right for you to lose weight or maintain your current weight.  Limit how much fluid you drink. Ask your health care provider or dietitian how much fluid you can have each day.  Limit or avoid alcohol as told by your health care provider or dietitian. Recommended foods The items listed may not be a complete list. Talk with your dietitian about what  dietary choices are best for you. Fruits All fresh, frozen, and canned fruits. Dried fruits, such as raisins, prunes, and cranberries. Vegetables All fresh vegetables. Vegetables that are frozen without sauce or added salt. Low-sodium or sodium-free canned vegetables. Grains Bread with less than 80 mg of sodium per slice. Whole-wheat pasta, quinoa, and brown rice. Oats and oatmeal. Barley. [place]. Grits and cream of wheat. Whole-grain and whole-wheat cold cereal. Meats and other protein foods Lean cuts of meat. Skinless chicken and [place]. Fish with high omega-3 fatty acids, such as salmon, sardines, and other cold-water fishes. Eggs. Dried beans, peas, and edamame. Unsalted nuts and nut butters. Dairy Low-fat or nonfat (skim) milk and dried milk. Rice milk, soy milk, and almond milk. Low-fat or nonfat yogurt. Small amounts of reduced-sodium block cheese. Low-sodium cottage cheese. Fats and oils Olive, canola, soybean, flaxseed, or sunflower oil. Avocado. Sweets and desserts Apple sauce. Granola bars. Sugar-free pudding and gelatin. Frozen fruit bars. Seasoning and other foods Fresh and dried herbs. Lemon or lime juice. Vinegar. Low-sodium ketchup. Salt-free marinades, salad dressings, sauces, and seasonings. The items listed above may not be a complete list of foods and beverages you can eat. Contact a dietitian for more information. Foods to avoid The items listed may not be a complete list. Talk with your dietitian about what dietary choices are best for you. Fruits Fruits that are dried with sodium-containing preservatives. Vegetables Canned vegetables. Frozen vegetables with sauce or seasonings. Creamed vegetables. [place] fries. Onion rings. Pickled vegetables and sauerkraut. Grains Bread with more than 80 mg of sodium per slice. Hot or cold cereal with more than 140 mg sodium per serving. Salted pretzels and crackers. Pre-packaged breadcrumbs. Bagels, croissants, and biscuits. Meats  and other protein foods Ribs and chicken wings. Bacon, ham, pepperoni, bologna, salami, and packaged luncheon meats. Hot dogs, bratwurst, and sausage. Canned meat. Smoked meat and fish. Salted nuts and seeds. Dairy Whole milk, half-and-half, and cream. Buttermilk. Processed cheese, cheese spreads, and cheese curds. Regular cottage cheese. Feta cheese. Shredded cheese. String cheese. Fats and oils Butter, lard, shortening, ghee, and bacon fat. Canned and packaged gravies. Seasoning and other foods Onion salt, garlic salt, table salt, and sea salt. Marinades. Regular salad dressings. Relishes, pickles, and olives. Meat flavorings and tenderizers, and bouillon cubes. Horseradish, ketchup, and mustard. Worcestershire sauce. Teriyaki sauce, soy sauce (including reduced sodium). Hot sauce and Tabasco sauce. Steak sauce, fish sauce, oyster sauce, and cocktail sauce. Taco seasonings. Barbecue sauce. Tartar sauce. The items listed above may not be a complete list of foods and beverages you should avoid. Contact a dietitian for more information. Summary  A heart failure eating plan includes changes that limit your intake of sodium and unhealthy fat, and it may help you lose weight or maintain a healthy weight. Your health care provider may also recommend limiting how much fluid you drink.  Most people with heart failure should eat no more than 2,300 mg of salt (sodium) a day. The amount of sodium  that is recommended for you may be lower, depending on your condition.  Contact your health care provider or dietitian before making any major changes to your diet. This information is not intended to replace advice given to you by your health care provider. Make sure you discuss any questions you have with your health care provider. Document Revised: 09/26/2018 Document Reviewed: 12/15/2016 Elsevier Patient Education  2020 [employer] Inc[employer]  

## 2019-12-29 NOTE — Assessment & Plan Note (Signed)
Ongoing and stable at this time. Reviewed recent cardiology note.  Continue current medication regimen and collaboration with cardiology.  Recommend: - Reminded to call for an overnight weight gain of >2 pounds or a weekly weight weight of >5 pounds - not adding salt to his food and has been reading food labels. Reviewed the importance of keeping daily sodium intake to 2000mg  daily

## 2019-12-29 NOTE — Assessment & Plan Note (Signed)
Chronic, ongoing.  Continue fish oil and consider addition of statin if LDL remains elevated on this lab check, current ASCVD 11.6%.  Obtain lipid panel today.

## 2019-12-30 LAB — LIPID PANEL W/O CHOL/HDL RATIO
Cholesterol, Total: 204 mg/dL — ABNORMAL HIGH (ref 100–199)
HDL: 78 mg/dL (ref >39)
LDL Chol Calc (NIH): 113 mg/dL — ABNORMAL HIGH (ref 0–99)
Triglycerides: 70 mg/dL (ref 0–149)
VLDL Cholesterol Cal: 13 mg/dL (ref 5–40)

## 2019-12-30 LAB — CBC WITH DIFFERENTIAL/PLATELET
Basophils Absolute: 0 10*3/uL (ref 0.0–0.2)
Basos: 1 %
EOS (ABSOLUTE): 0.1 10*3/uL (ref 0.0–0.4)
Eos: 1 %
Hematocrit: 48.1 % (ref 37.5–51.0)
Hemoglobin: 16.3 g/dL (ref 13.0–17.7)
Immature Grans (Abs): 0 10*3/uL (ref 0.0–0.1)
Immature Granulocytes: 1 %
Lymphocytes Absolute: 1 10*3/uL (ref 0.7–3.1)
Lymphs: 16 %
MCH: 35.4 pg — ABNORMAL HIGH (ref 26.6–33.0)
MCHC: 33.9 g/dL (ref 31.5–35.7)
MCV: 105 fL — ABNORMAL HIGH (ref 79–97)
Monocytes Absolute: 0.7 10*3/uL (ref 0.1–0.9)
Monocytes: 10 %
Neutrophils Absolute: 4.5 10*3/uL (ref 1.4–7.0)
Neutrophils: 71 %
Platelets: 110 10*3/uL — ABNORMAL LOW (ref 150–450)
RBC: 4.6 x10E6/uL (ref 4.14–5.80)
RDW: 13 % (ref 11.6–15.4)
WBC: 6.2 10*3/uL (ref 3.4–10.8)

## 2019-12-30 LAB — BASIC METABOLIC PANEL
BUN/Creatinine Ratio: 12 (ref 10–24)
BUN: 16 mg/dL (ref 8–27)
CO2: 25 mmol/L (ref 20–29)
Calcium: 9.8 mg/dL (ref 8.6–10.2)
Chloride: 100 mmol/L (ref 96–106)
Creatinine, Ser: 1.38 mg/dL — ABNORMAL HIGH (ref 0.76–1.27)
GFR calc Af Amer: 62 mL/min/{1.73_m2} (ref >59)
GFR calc non Af Amer: 54 mL/min/{1.73_m2} — ABNORMAL LOW (ref >59)
Glucose: 84 mg/dL (ref 65–99)
Potassium: 3.9 mmol/L (ref 3.5–5.2)
Sodium: 140 mmol/L (ref 134–144)

## 2019-12-30 NOTE — Progress Notes (Signed)
Contacted via MyChart  Good evening Mr. Parsa, your labs have returned.  Kidney function continues to show some mild kidney disease with no decline.  We will continue to monitor.  Blood count continues to show platelets on lower side, which we will continue to monitor closely.  Cholesterol levels remain above goal for stroke prevention.  Have you taken a statin before?  If not this may be beneficial, even at a low dose, to help with stroke prevention.  Let me know if any questions or thoughts. Keep being awesome!! Kindest regards, Elektra Wartman

## 2020-02-17 IMAGING — US US EXTREM LOW*L* LIMITED
1 series · 14 of 25 positions shown · non-contrast
Comparison: No recent prior.

CLINICAL DATA: Left-sided discomfort. Evaluation for left inguinal
hernia.

EXAM:
ULTRASOUND LEFT LOWER EXTREMITY LIMITED
TECHNIQUE: Ultrasound examination of the lower extremity soft tissues was
performed in the area of clinical concern.

[Series 1: us extrem low*left* limited · 0.09mm/px · 14 of 34 slices shown]
[im 1/34]
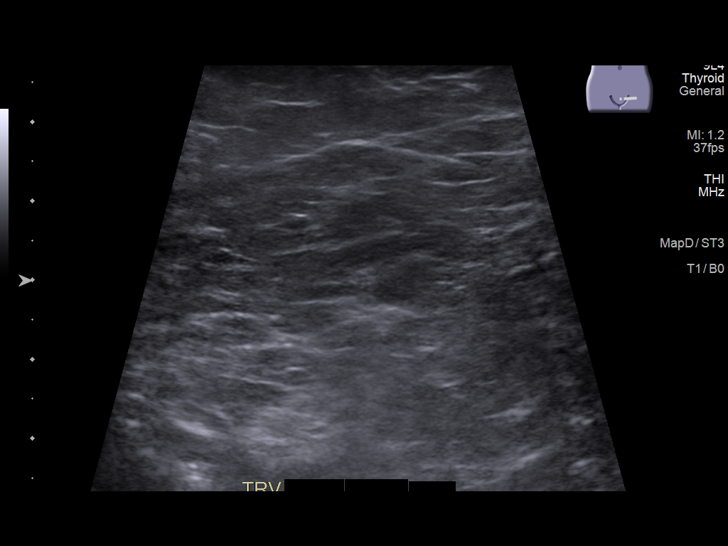
[im 3/34]
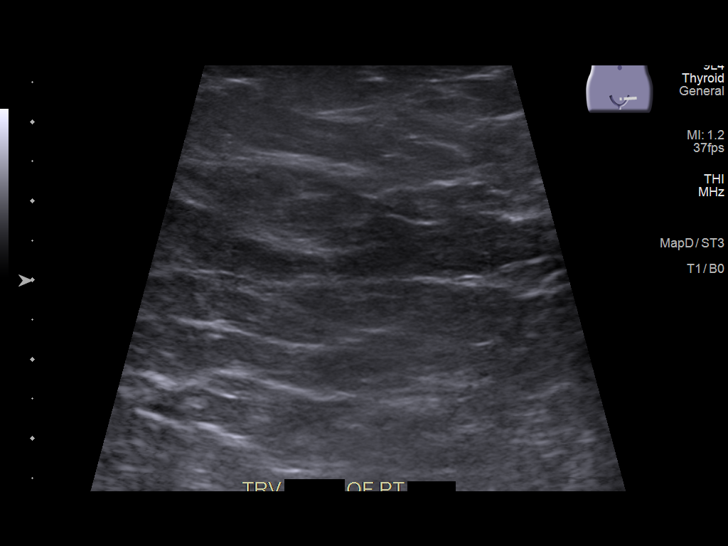
[im 6/34]
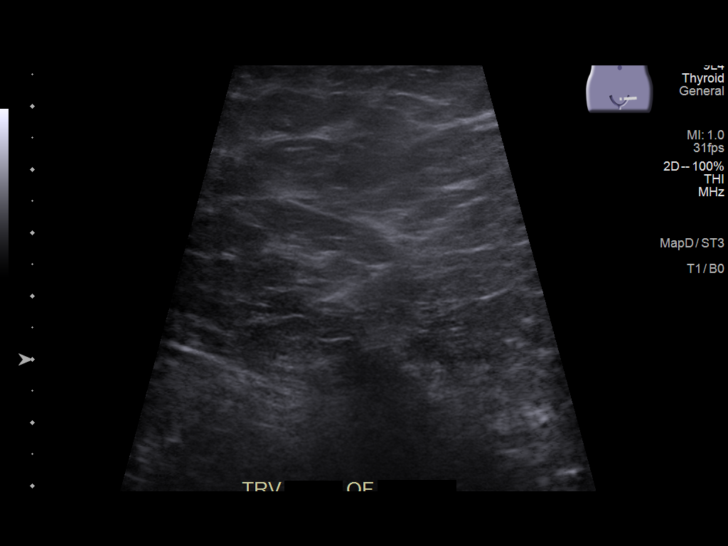
[im 9/34]
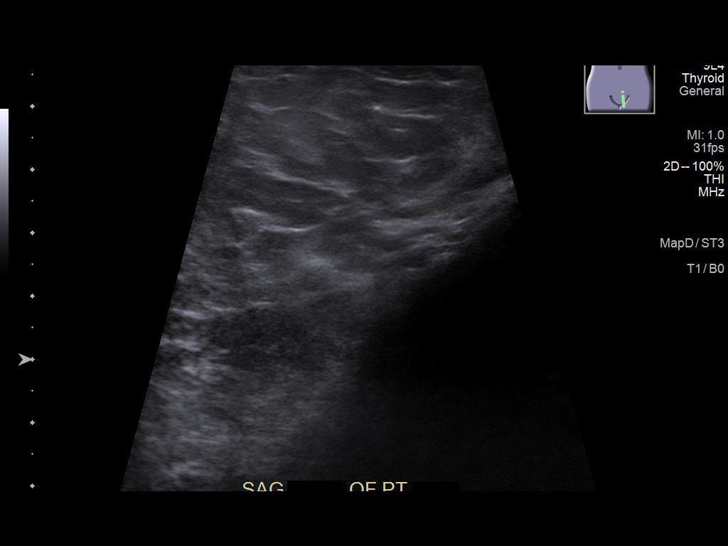
[im 12/34]
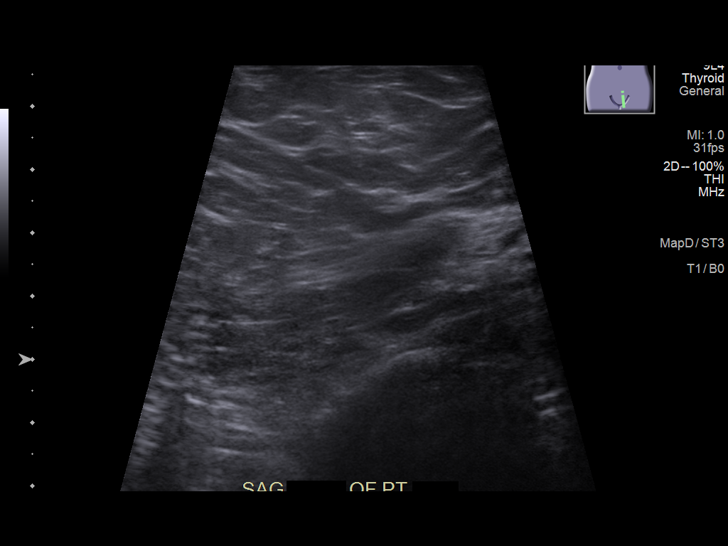
[im 13/34]
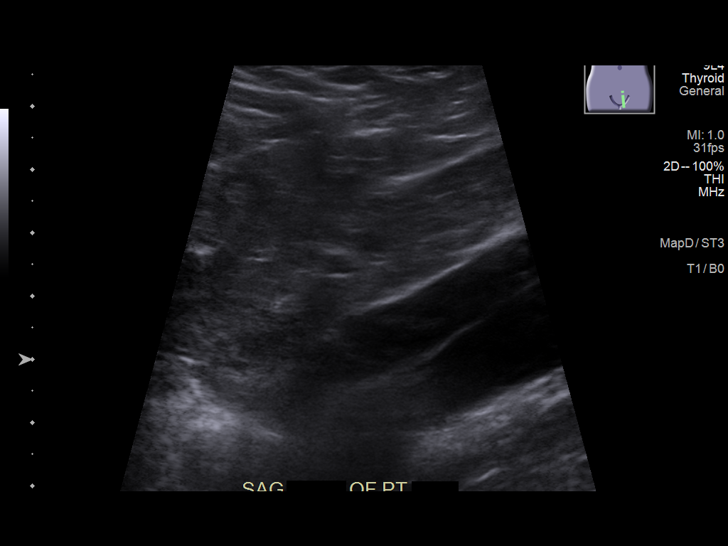
[im 16/34]
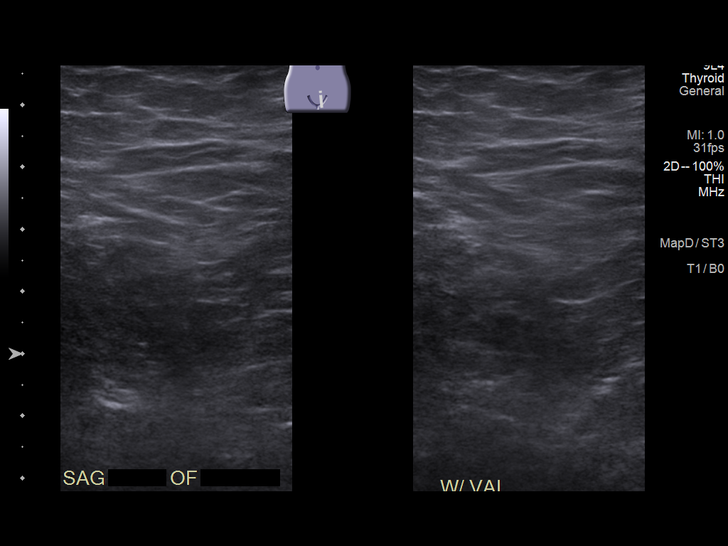
[im 18/34]
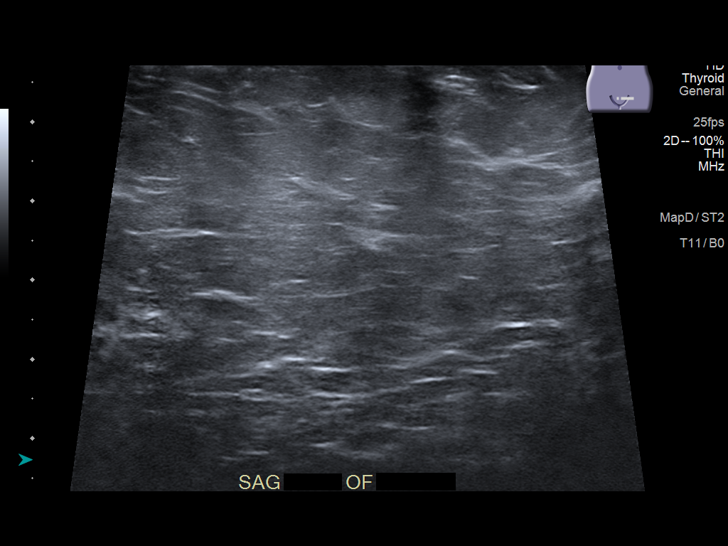
[im 21/34]
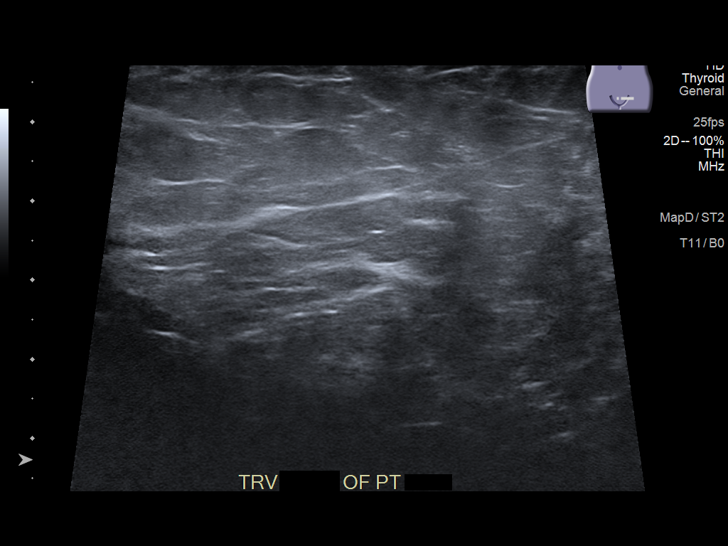
[im 23/34]
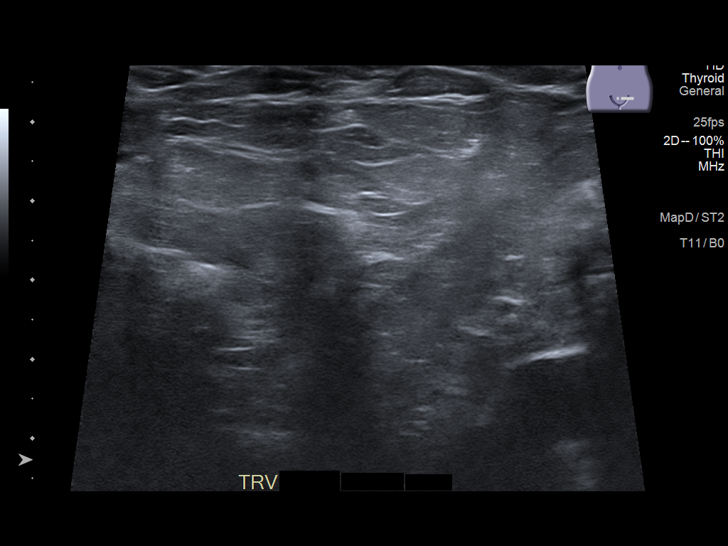
[im 25/34]
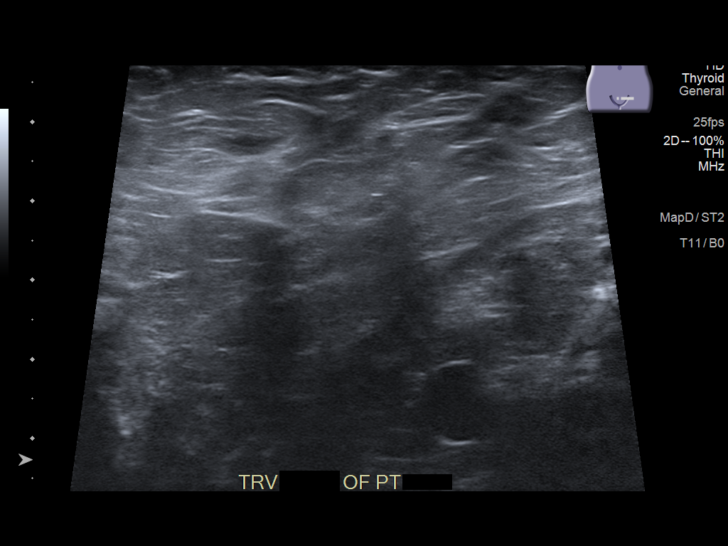
[im 28/34]
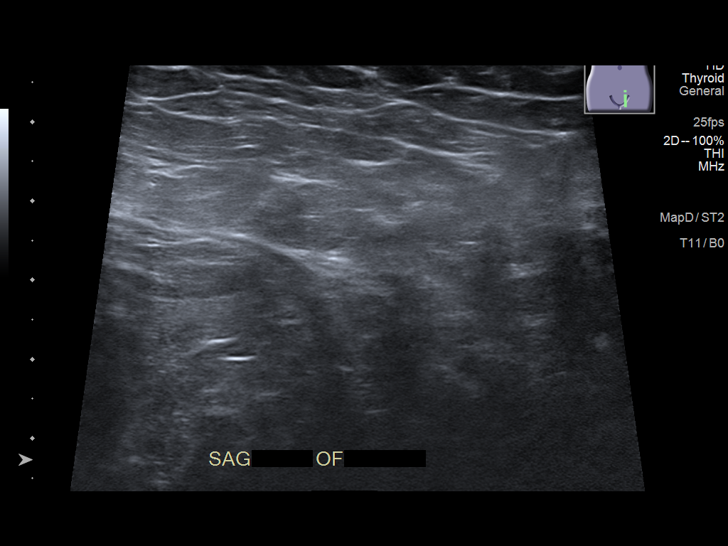
[im 31/34]
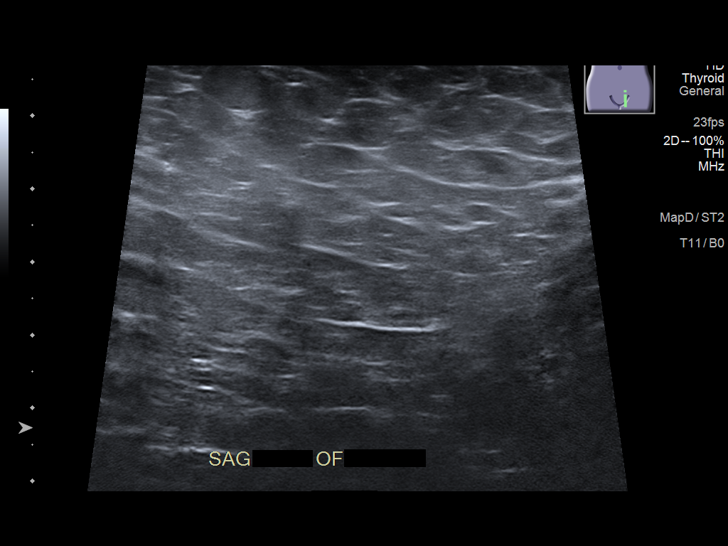
[im 34/34]
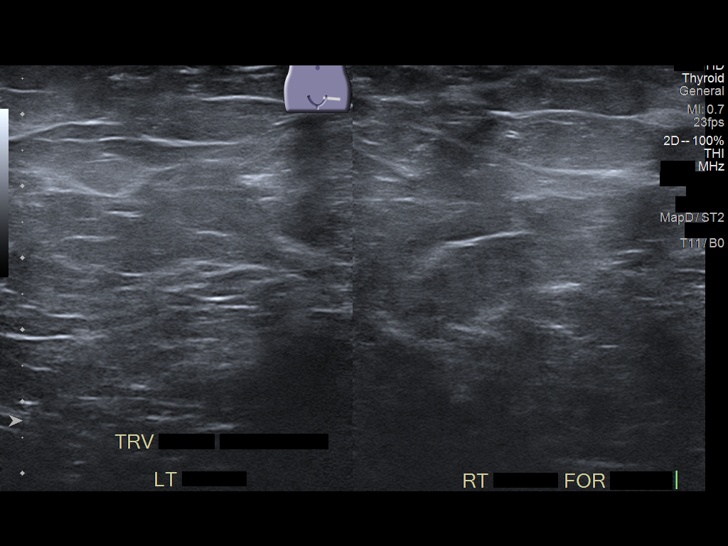

[14 of 25 positions shown; findings below may reference images not displayed]

FINDINGS: No cystic or solid abnormalities identified. No evidence of hernia.
Exam limited due to patient's body habitus.
IMPRESSION: Negative exam.  No evidence of hernia.

## 2020-06-08 ENCOUNTER — Telehealth: Payer: Self-pay

## 2020-06-08 ENCOUNTER — Encounter: Payer: Self-pay | Admitting: Nurse Practitioner

## 2020-06-08 NOTE — Telephone Encounter (Signed)
Called pt to r/s 11/22 appt, no answer, left vm, sent letter via [facility]

## 2020-07-05 ENCOUNTER — Ambulatory Visit: Payer: PRIVATE HEALTH INSURANCE | Admitting: Nurse Practitioner

## 2020-07-20 ENCOUNTER — Encounter: Payer: Self-pay | Admitting: Nurse Practitioner

## 2020-07-20 ENCOUNTER — Ambulatory Visit (INDEPENDENT_AMBULATORY_CARE_PROVIDER_SITE_OTHER): Payer: PRIVATE HEALTH INSURANCE | Admitting: Nurse Practitioner

## 2020-07-20 ENCOUNTER — Other Ambulatory Visit: Payer: Self-pay

## 2020-07-20 VITALS — BP 123/74 | HR 60 | Temp 97.5°F | Ht 69.06 in | Wt 242.8 lb

## 2020-07-20 DIAGNOSIS — I110 Hypertensive heart disease with heart failure: Secondary | ICD-10-CM

## 2020-07-20 DIAGNOSIS — E782 Mixed hyperlipidemia: Secondary | ICD-10-CM

## 2020-07-20 DIAGNOSIS — Z6835 Body mass index (BMI) 35.0-35.9, adult: Secondary | ICD-10-CM

## 2020-07-20 DIAGNOSIS — J709 Respiratory conditions due to unspecified external agent: Secondary | ICD-10-CM | POA: Diagnosis not present

## 2020-07-20 DIAGNOSIS — I5042 Chronic combined systolic (congestive) and diastolic (congestive) heart failure: Secondary | ICD-10-CM | POA: Diagnosis not present

## 2020-07-20 DIAGNOSIS — Z95810 Presence of automatic (implantable) cardiac defibrillator: Secondary | ICD-10-CM

## 2020-07-20 DIAGNOSIS — G4733 Obstructive sleep apnea (adult) (pediatric): Secondary | ICD-10-CM

## 2020-07-20 DIAGNOSIS — J4550 Severe persistent asthma, uncomplicated: Secondary | ICD-10-CM

## 2020-07-20 NOTE — Assessment & Plan Note (Signed)
BMI 35.80 with over 100 pounds weight loss in past year.  Continue collaboration with Dr. Leeanne Rio.  Recommended eating smaller high protein, low fat meals more frequently and exercising 30 mins a day 5 times a week with a goal of 10-15lb weight loss in the next 3 months. Patient voiced their understanding and motivation to adhere to these recommendations.

## 2020-07-20 NOTE — Assessment & Plan Note (Signed)
Chronic, ongoing,  Followed by pulmonary and allergist.  Continue this collaboration and current medication regimen as prescribed by pulmonary.

## 2020-07-20 NOTE — Assessment & Plan Note (Signed)
Chronic, stable.  Continue use of CPAP daily.

## 2020-07-20 NOTE — Progress Notes (Signed)
BP 123/74   Pulse 60   Temp (!) 97.5 F (36.4 C) (Oral)   Ht 5' 9.06" (1.754 m)   Wt 242 lb 12.8 oz (110.1 kg)   SpO2 93%   BMI 35.80 kg/m    Subjective:    Patient ID: Thomas Calhoun, male    DOB: 11-01-1955, 64 y.o.   MRN: [id number]  HPI: Thomas Calhoun is a 64 y.o. male  Chief Complaint  Patient presents with  . Hypertension    HYPERTENSION / HYPERLIPIDEMIA & CHF  Last saw [facility] 07/13/20, no changes made. Continues Metoprolol, Amiodarone, Lasix, Losartan and Spironolactone at home. Has lost > 100 pounds over the past year, continues to work with Dr. Enrigue Catena.  Labs with cardiology on 07/13/20 showed CRT 1.2, GFR 64, K+ 4.1 --  Lipid TCHOL 238 and LDL 130 -- working on diet changes to lower.  Not currently taking any cholesterol medication.  Satisfied with current treatment? yes  Duration of hypertension: chronic  BP monitoring frequency: not checking BP range:  BP medication side effects: no  Duration of hyperlipidemia: chronic   Cholesterol supplements: none  Aspirin: no  Recent stressors: no  Recurrent headaches: no  Visual changes: no  Palpitations: no  Dyspnea: no  Chest pain: no  Lower extremity edema: no  Dizzy/lightheaded: no  The 10-year ASCVD risk score Mikey Bussing DC Jr., et al., 2013) is: 10.5%   Values used to calculate the score:     Age: 59 years     Sex: Male     Is Non-Hispanic African American: No     Diabetic: No     Tobacco smoker: No     Systolic Blood Pressure: 680 mmHg     Is BP treated: Yes     HDL Cholesterol: 76 mg/dL     Total Cholesterol: 238 mg/dL  ASTHMA  Saw Dr. Raul Del on 03/25/20 and allergist [facility] 07/15/20. Recent Theophylline, level 02/04/2019 === 3.9 and continues on Nucala and Prednisone. Has underlying OSA and uses CPAP 100% of the time.  Asthma status: stable  Satisfied with current treatment?: yes  Albuterol/rescue inhaler frequency:  Dyspnea frequency: none  Wheezing frequency: occasional  Cough  frequency: none  Nocturnal symptom frequency:  Limitation of activity: no  Current upper respiratory symptoms: no  Triggers: seasonal  Home peak flows: none  Last Spirometry: with pulmonary  Failed/intolerant to following asthma meds:  Asthma meds in past: unknown  Aerochamber/spacer use: no  Visits to ER or Urgent Care in past year: no  Pneumovax: Not up to Date  Influenza: Up to Date   Relevant past medical, surgical, family and social history reviewed and updated as indicated. Interim medical history since our last visit reviewed. Allergies and medications reviewed and updated.  Review of Systems  Constitutional: Negative for activity change, diaphoresis, fatigue and fever.  Respiratory: Negative for cough, chest tightness, shortness of breath and wheezing.   Cardiovascular: Negative for chest pain, palpitations and leg swelling.  Gastrointestinal: Negative.   Neurological: Negative.   Psychiatric/Behavioral: Negative.    Per HPI unless specifically indicated above     Objective:    BP 123/74   Pulse 60   Temp (!) 97.5 F (36.4 C) (Oral)   Ht 5' 9.06" (1.754 m)   Wt 242 lb 12.8 oz (110.1 kg)   SpO2 93%   BMI 35.80 kg/m   Wt Readings from Last 3 Encounters:  07/20/20 242 lb 12.8 oz (110.1 kg)  12/29/19 281  lb 12.8 oz (127.8 kg)  11/11/19 298 lb (135.2 kg)    Physical Exam Vitals and nursing note reviewed.  Constitutional:      General: He is awake. He is not in acute distress.    Appearance: He is well-developed. He is morbidly obese. He is not ill-appearing.  HENT:     Head: Normocephalic and atraumatic.     Right Ear: Hearing normal. No drainage.     Left Ear: Hearing normal. No drainage.  Eyes:     General: Lids are normal.        Right eye: No discharge.        Left eye: No discharge.     Conjunctiva/sclera: Conjunctivae normal.     Pupils: Pupils are equal, round, and reactive to light.  Neck:     Thyroid: No thyromegaly.     Vascular: No carotid  bruit.     Trachea: Trachea normal.  Cardiovascular:     Rate and Rhythm: Normal rate and regular rhythm.     Heart sounds: Normal heart sounds, S1 normal and S2 normal. No murmur heard.  No gallop.   Pulmonary:     Effort: Pulmonary effort is normal. No accessory muscle usage or respiratory distress.     Breath sounds: Normal breath sounds.  Abdominal:     General: Bowel sounds are normal.     Palpations: Abdomen is soft.  Musculoskeletal:        General: Normal range of motion.     Cervical back: Normal range of motion and neck supple.     Right lower leg: No edema.     Left lower leg: No edema.  Skin:    General: Skin is warm and dry.     Capillary Refill: Capillary refill takes less than 2 seconds.  Neurological:     Mental Status: He is alert and oriented to person, place, and time.  Psychiatric:        Attention and Perception: Attention normal.        Mood and Affect: Mood normal.        Speech: Speech normal.        Behavior: Behavior normal. Behavior is cooperative.        Thought Content: Thought content normal.    Results for orders placed or performed in visit on 65/68/12  Basic metabolic panel  Result Value Ref Range   Glucose 84 65 - 99 mg/dL   BUN 16 8 - 27 mg/dL   Creatinine, Ser 1.38 (H) 0.76 - 1.27 mg/dL   GFR calc non Af Amer 54 (L) >59 mL/min/1.73   GFR calc Af Amer 62 >59 mL/min/1.73   BUN/Creatinine Ratio 12 10 - 24   Sodium 140 134 - 144 mmol/L   Potassium 3.9 3.5 - 5.2 mmol/L   Chloride 100 96 - 106 mmol/L   CO2 25 20 - 29 mmol/L   Calcium 9.8 8.6 - 10.2 mg/dL  Lipid Panel w/o Chol/HDL Ratio  Result Value Ref Range   Cholesterol, Total 204 (H) 100 - 199 mg/dL   Triglycerides 70 0 - 149 mg/dL   HDL 78 >39 mg/dL   VLDL Cholesterol Cal 13 5 - 40 mg/dL   LDL Chol Calc (NIH) 113 (H) 0 - 99 mg/dL  CBC with Differential/Platelet  Result Value Ref Range   WBC 6.2 3.4 - 10.8 x10E3/uL   RBC 4.60 4.14 - 5.80 x10E6/uL   Hemoglobin 16.3 13.0 - 17.7  g/dL   Hematocrit 48.1  37.5 - 51.0 %   MCV 105 (H) 79 - 97 fL   MCH 35.4 (H) 26.6 - 33.0 pg   MCHC 33.9 31 - 35 g/dL   RDW 13.0 11.6 - 15.4 %   Platelets 110 (L) 150 - 450 x10E3/uL   Neutrophils 71 Not Estab. %   Lymphs 16 Not Estab. %   Monocytes 10 Not Estab. %   Eos 1 Not Estab. %   Basos 1 Not Estab. %   Neutrophils Absolute 4.5 1.40 - 7.00 x10E3/uL   Lymphocytes Absolute 1.0 0 - 3 x10E3/uL   Monocytes Absolute 0.7 0 - 0 x10E3/uL   EOS (ABSOLUTE) 0.1 0.0 - 0.4 x10E3/uL   Basophils Absolute 0.0 0 - 0 x10E3/uL   Immature Granulocytes 1 Not Estab. 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  Continue current medication regimen and collaboration with cardiology.  Notes reviewed.  Recent labs with cardiology.  Discussed with him possible benefit of adding on statin if levels remain elevated, he is currently working on diet and reducing cheese.  Return in 6 months.      Chronic combined systolic and diastolic CHF (congestive heart failure) ([place]) - Primary    Ongoing and stable at this time. Reviewed recent cardiology note.  Continue current medication regimen and collaboration with cardiology.  Recent labs with cardiology. Recommend: - Reminded to call for an overnight weight gain of >2 pounds or a weekly weight weight of >5 pounds - not adding salt to his food and has been reading food labels. Reviewed the importance of keeping daily sodium intake to 2000mg  daily         Respiratory   Severe asthma    Chronic, ongoing.  Continue current medication regimen and collaboration with pulmonary.  Recent noted reviewed.        Extrinsic allergic respiratory disease (HCC)    Chronic, ongoing,  Followed by pulmonary and allergist.  Continue this collaboration and current  medication regimen as prescribed by pulmonary.        Obstructive sleep apnea    Chronic, stable.  Continue use of CPAP daily.        Other   Hyperlipidemia    Chronic, ongoing.  Continue fish oil and consider addition of statin if LDL remains elevated on this lab check, current ASCVD 10.5%.  Obtain lipid panel next visit, recent labs with cardiology.       Obesity    BMI 35.80 with over 100 pounds weight loss in past year.  Continue collaboration with Dr. Leeanne Rio.  Recommended eating smaller high protein, low fat meals more frequently and exercising 30 mins a day 5 times a week with a goal of 10-15lb weight loss in the next 3 months. Patient voiced their understanding and motivation to adhere to these recommendations.       ICD (implantable cardioverter-defibrillator) in place    Continue collaboration with cardiology team at [facility].          Follow up plan: Return in about 6 months (around 01/18/2021) for HTN/HLD, CHF, ASTHMA.

## 2020-07-20 NOTE — Patient Instructions (Signed)
Healthy Eating Following a healthy eating pattern may help you to achieve and maintain a healthy body weight, reduce the risk of chronic disease, and live a long and productive life. It is important to follow a healthy eating pattern at an appropriate calorie level for your body. Your nutritional needs should be met primarily through food by choosing a variety of nutrient-rich foods. What are tips for following this plan? Reading food labels  Read labels and choose the following: ? Reduced or low sodium. ? Juices with 100% fruit juice. ? Foods with low saturated fats and high polyunsaturated and monounsaturated fats. ? Foods with whole grains, such as whole wheat, cracked wheat, brown rice, and wild rice. ? Whole grains that are fortified with folic acid. This is recommended for women who are pregnant or who want to become pregnant.  Read labels and avoid the following: ? Foods with a lot of added sugars. These include foods that contain brown sugar, corn sweetener, corn syrup, dextrose, fructose, glucose, high-fructose corn syrup, honey, invert sugar, lactose, malt syrup, maltose, molasses, raw sugar, sucrose, trehalose, or turbinado sugar.  Do not eat more than the following amounts of added sugar per day:  6 teaspoons (25 g) for women.  9 teaspoons (38 g) for men. ? Foods that contain processed or refined starches and grains. ? Refined grain products, such as white flour, degermed cornmeal, white bread, and white rice. Shopping  Choose nutrient-rich snacks, such as vegetables, whole fruits, and nuts. Avoid high-calorie and high-sugar snacks, such as potato chips, fruit snacks, and candy.  Use oil-based dressings and spreads on foods instead of solid fats such as butter, stick margarine, or cream cheese.  Limit pre-made sauces, mixes, and "instant" products such as flavored rice, instant noodles, and ready-made pasta.  Try more plant-protein sources, such as tofu, tempeh, black beans,  edamame, lentils, nuts, and seeds.  Explore eating plans such as the Mediterranean diet or vegetarian diet. Cooking  Use oil to saut or stir-fry foods instead of solid fats such as butter, stick margarine, or lard.  Try baking, boiling, grilling, or broiling instead of frying.  Remove the fatty part of meats before cooking.  Steam vegetables in water or broth. Meal planning   At meals, imagine dividing your plate into fourths: ? One-half of your plate is fruits and vegetables. ? One-fourth of your plate is whole grains. ? One-fourth of your plate is protein, especially lean meats, poultry, eggs, tofu, beans, or nuts.  Include low-fat dairy as part of your daily diet. Lifestyle  Choose healthy options in all settings, including home, work, school, restaurants, or stores.  Prepare your food safely: ? Wash your hands after handling raw meats. ? Keep food preparation surfaces clean by regularly washing with hot, soapy water. ? Keep raw meats separate from ready-to-eat foods, such as fruits and vegetables. ? Cook seafood, meat, poultry, and eggs to the recommended internal temperature. ? Store foods at safe temperatures. In general:  Keep cold foods at 59F (4.4C) or below.  Keep hot foods at 159F (60C) or above.  Keep your freezer at [facility] (-17.8C) or below.  Foods are no longer safe to eat when they have been between the temperatures of 40-159F (4.4-60C) for more than 2 hours. What foods should I eat? Fruits Aim to eat 2 cup-equivalents of fresh, canned (in natural juice), or frozen fruits each day. Examples of 1 cup-equivalent of fruit include 1 small apple, 8 large strawberries, 1 cup canned fruit,  cup  dried fruit, or 1 cup 100% juice. Vegetables Aim to eat 2-3 cup-equivalents of fresh and frozen vegetables each day, including different varieties and colors. Examples of 1 cup-equivalent of vegetables include 2 medium carrots, 2 cups raw, leafy greens, 1 cup chopped  vegetable (raw or cooked), or 1 medium baked potato. Grains Aim to eat 6 ounce-equivalents of whole grains each day. Examples of 1 ounce-equivalent of grains include 1 slice of bread, 1 cup ready-to-eat cereal, 3 cups popcorn, or  cup cooked rice, pasta, or cereal. Meats and other proteins Aim to eat 5-6 ounce-equivalents of protein each day. Examples of 1 ounce-equivalent of protein include 1 egg, 1/2 cup nuts or seeds, or 1 tablespoon (16 g) peanut butter. A cut of meat or fish that is the size of a deck of cards is about 3-4 ounce-equivalents.  Of the protein you eat each week, try to have at least 8 ounces come from seafood. This includes salmon, trout, herring, and anchovies. Dairy Aim to eat 3 cup-equivalents of fat-free or low-fat dairy each day. Examples of 1 cup-equivalent of dairy include 1 cup (240 mL) milk, 8 ounces (250 g) yogurt, 1 ounces (44 g) natural cheese, or 1 cup (240 mL) fortified soy milk. Fats and oils  Aim for about 5 teaspoons (21 g) per day. Choose monounsaturated fats, such as canola and olive oils, avocados, peanut butter, and most nuts, or polyunsaturated fats, such as sunflower, corn, and soybean oils, walnuts, pine nuts, sesame seeds, sunflower seeds, and flaxseed. Beverages  Aim for six 8-oz glasses of water per day. Limit coffee to three to five 8-oz cups per day.  Limit caffeinated beverages that have added calories, such as soda and energy drinks.  Limit alcohol intake to no more than 1 drink a day for nonpregnant women and 2 drinks a day for men. One drink equals 12 oz of beer (355 mL), 5 oz of wine (148 mL), or 1 oz of hard liquor (44 mL). Seasoning and other foods  Avoid adding excess amounts of salt to your foods. Try flavoring foods with herbs and spices instead of salt.  Avoid adding sugar to foods.  Try using oil-based dressings, sauces, and spreads instead of solid fats. This information is based on general [place] nutrition guidelines. For more  information, visit [employer]. Exact amounts may vary based on your nutrition needs. Summary  A healthy eating plan may help you to maintain a healthy weight, reduce the risk of chronic diseases, and stay active throughout your life.  Plan your meals. Make sure you eat the right portions of a variety of nutrient-rich foods.  Try baking, boiling, grilling, or broiling instead of frying.  Choose healthy options in all settings, including home, work, school, restaurants, or stores. This information is not intended to replace advice given to you by your health care provider. Make sure you discuss any questions you have with your health care provider. Document Revised: 11/12/2017 Document Reviewed: 11/12/2017 Elsevier Patient Education  [place].

## 2020-07-20 NOTE — Assessment & Plan Note (Signed)
Continue collaboration with cardiology team at [facility]. 

## 2020-07-20 NOTE — Assessment & Plan Note (Signed)
Ongoing and stable at this time. Reviewed recent cardiology note.  Continue current medication regimen and collaboration with cardiology.  Recent labs with cardiology. Recommend: - Reminded to call for an overnight weight gain of >2 pounds or a weekly weight weight of >5 pounds - not adding salt to his food and has been reading food labels. Reviewed the importance of keeping daily sodium intake to 2000mg  daily

## 2020-07-20 NOTE — Assessment & Plan Note (Signed)
Chronic, ongoing with BP at goal today.  Continue current medication regimen and collaboration with cardiology.  Notes reviewed.  Recent labs with cardiology.  Discussed with him possible benefit of adding on statin if levels remain elevated, he is currently working on diet and reducing cheese.  Return in 6 months.

## 2020-07-20 NOTE — Assessment & Plan Note (Signed)
Chronic, ongoing.  Continue current medication regimen and collaboration with pulmonary.  Recent noted reviewed.   

## 2020-07-20 NOTE — Assessment & Plan Note (Signed)
Chronic, ongoing.  Continue fish oil and consider addition of statin if LDL remains elevated on this lab check, current ASCVD 10.5%.  Obtain lipid panel next visit, recent labs with cardiology.

## 2021-01-18 ENCOUNTER — Ambulatory Visit: Payer: PRIVATE HEALTH INSURANCE | Admitting: Nurse Practitioner

## 2021-02-15 ENCOUNTER — Encounter: Payer: Self-pay | Admitting: Nurse Practitioner

## 2021-02-15 ENCOUNTER — Telehealth: Payer: No Typology Code available for payment source

## 2021-02-15 DIAGNOSIS — Z95810 Presence of automatic (implantable) cardiac defibrillator: Secondary | ICD-10-CM | POA: Diagnosis not present

## 2021-02-15 NOTE — Telephone Encounter (Signed)
Called pt to r/s 7/7 appt no answer left vm

## 2021-02-17 ENCOUNTER — Ambulatory Visit: Payer: PRIVATE HEALTH INSURANCE | Admitting: Nurse Practitioner

## 2021-03-16 DIAGNOSIS — I10 Essential (primary) hypertension: Secondary | ICD-10-CM | POA: Diagnosis not present

## 2021-03-31 ENCOUNTER — Ambulatory Visit (INDEPENDENT_AMBULATORY_CARE_PROVIDER_SITE_OTHER): Payer: BC Managed Care – PPO | Admitting: Nurse Practitioner

## 2021-03-31 ENCOUNTER — Other Ambulatory Visit: Payer: Self-pay

## 2021-03-31 ENCOUNTER — Encounter: Payer: Self-pay | Admitting: Nurse Practitioner

## 2021-03-31 VITALS — BP 130/65 | HR 60 | Temp 98.5°F | Wt 211.4 lb

## 2021-03-31 DIAGNOSIS — Z6831 Body mass index (BMI) 31.0-31.9, adult: Secondary | ICD-10-CM

## 2021-03-31 DIAGNOSIS — J4550 Severe persistent asthma, uncomplicated: Secondary | ICD-10-CM

## 2021-03-31 DIAGNOSIS — R7301 Impaired fasting glucose: Secondary | ICD-10-CM

## 2021-03-31 DIAGNOSIS — I110 Hypertensive heart disease with heart failure: Secondary | ICD-10-CM | POA: Diagnosis not present

## 2021-03-31 DIAGNOSIS — G4733 Obstructive sleep apnea (adult) (pediatric): Secondary | ICD-10-CM

## 2021-03-31 DIAGNOSIS — E782 Mixed hyperlipidemia: Secondary | ICD-10-CM

## 2021-03-31 DIAGNOSIS — Z95810 Presence of automatic (implantable) cardiac defibrillator: Secondary | ICD-10-CM

## 2021-03-31 DIAGNOSIS — N400 Enlarged prostate without lower urinary tract symptoms: Secondary | ICD-10-CM | POA: Diagnosis not present

## 2021-03-31 DIAGNOSIS — I5042 Chronic combined systolic (congestive) and diastolic (congestive) heart failure: Secondary | ICD-10-CM

## 2021-03-31 DIAGNOSIS — J709 Respiratory conditions due to unspecified external agent: Secondary | ICD-10-CM

## 2021-03-31 DIAGNOSIS — E6609 Other obesity due to excess calories: Secondary | ICD-10-CM

## 2021-03-31 NOTE — Assessment & Plan Note (Signed)
Chronic, ongoing,  Followed by pulmonary and allergist.  Continue this collaboration and current medication regimen as prescribed by pulmonary.

## 2021-03-31 NOTE — Progress Notes (Signed)
BP 130/65   Pulse 60   Temp 98.5 F (36.9 C) (Oral)   Wt 211 lb 6.4 oz (95.9 kg)   SpO2 97%   BMI 31.17 kg/m    Subjective:    Patient ID: Thomas Calhoun, male    DOB: 1956-06-05, 65 y.o.   MRN: [id number]  HPI: Thomas Calhoun is a 65 y.o. male  Chief Complaint  Patient presents with   Asthma   Hyperlipidemia   Hypertension   Congestive Heart Failure   HYPERTENSION / HYPERLIPIDEMIA & CHF  Last saw [place] cardiology 01/11/21, would like cholesterol checked today. BNP in May 2022 = 316.  ICD in place -- May 2020.  History of IFG with recent glucose 88.  Continues Metoprolol, Amiodarone, Losartan and Spironolactone at home. Has lost > 143 pounds total, continues to work with Dr. Enrigue Catena -- last saw 03/16/21.  Labs with cardiology on 01/11/21 showed CRT 0.9, GFR 95, K+ 3.8 --  Lipid TCHOL 220 and LDL 121 -- working on diet changes to lower.  Taking Crestor. Satisfied with current treatment? yes  Duration of hypertension: chronic  BP monitoring frequency: not checking BP range:  BP medication side effects: no  Duration of hyperlipidemia: chronic   Cholesterol supplements: none  Aspirin: no  Recent stressors: no  Recurrent headaches: no  Visual changes: no  Palpitations: no  Dyspnea: no  Chest pain: no  Lower extremity edema: no  Dizzy/lightheaded: no  The 10-year ASCVD risk score Mikey Bussing DC Jr., et al., 2013) is: 11.7%   Values used to calculate the score:     Age: 46 years     Sex: Male     Is Non-Hispanic African American: No     Diabetic: No     Tobacco smoker: No     Systolic Blood Pressure: [id number]     Is BP treated: Yes     HDL Cholesterol: 77 mg/dL     Total Cholesterol: 220 mg/dL  ASTHMA  Saw Dr. Raul Del on 10/25/20 and allergist at [facility] 07/15/20. Theophylline discontinued by Dr. Raul Del === continues on Nucala, Advair, Albuterol, Claritin, Singulair, Spiriva.  Has underlying OSA and uses CPAP 100% of the time.  Asthma status: stable  Satisfied with  current treatment?: yes  Albuterol/rescue inhaler frequency:  Dyspnea frequency: none  Wheezing frequency: occasional  Cough frequency: none  Nocturnal symptom frequency:  Limitation of activity: no  Current upper respiratory symptoms: no  Triggers: seasonal  Home peak flows: none  Last Spirometry: with pulmonary  Failed/intolerant to following asthma meds:  Asthma meds in past: unknown  Aerochamber/spacer use: no  Visits to ER or Urgent Care in past year: no  Pneumovax: Not up to Date  Influenza: Up to Date   Relevant past medical, surgical, family and social history reviewed and updated as indicated. Interim medical history since our last visit reviewed. Allergies and medications reviewed and updated.  Review of Systems  Constitutional:  Negative for activity change, diaphoresis, fatigue and fever.  Respiratory:  Negative for cough, chest tightness, shortness of breath and wheezing.   Cardiovascular:  Negative for chest pain, palpitations and leg swelling.  Gastrointestinal: Negative.   Neurological: Negative.   Psychiatric/Behavioral: Negative.     Per HPI unless specifically indicated above     Objective:    BP 130/65   Pulse 60   Temp 98.5 F (36.9 C) (Oral)   Wt 211 lb 6.4 oz (95.9 kg)   SpO2 97%   BMI  31.17 kg/m   Wt Readings from Last 3 Encounters:  03/31/21 211 lb 6.4 oz (95.9 kg)  07/20/20 242 lb 12.8 oz (110.1 kg)  12/29/19 281 lb 12.8 oz (127.8 kg)    Physical Exam Vitals and nursing note reviewed.  Constitutional:      General: He is awake. He is not in acute distress.    Appearance: He is well-developed and well-groomed. He is obese. He is not ill-appearing.  HENT:     Head: Normocephalic and atraumatic.     Right Ear: Hearing normal. No drainage.     Left Ear: Hearing normal. No drainage.  Eyes:     General: Lids are normal.        Right eye: No discharge.        Left eye: No discharge.     Conjunctiva/sclera: Conjunctivae normal.      Pupils: Pupils are equal, round, and reactive to light.  Neck:     Thyroid: No thyromegaly.     Vascular: No carotid bruit.     Trachea: Trachea normal.  Cardiovascular:     Rate and Rhythm: Normal rate and regular rhythm.     Heart sounds: Normal heart sounds, S1 normal and S2 normal. No murmur heard.   No gallop.  Pulmonary:     Effort: Pulmonary effort is normal. No accessory muscle usage or respiratory distress.     Breath sounds: Normal breath sounds.  Abdominal:     General: Bowel sounds are normal.     Palpations: Abdomen is soft.  Musculoskeletal:        General: Normal range of motion.     Cervical back: Normal range of motion and neck supple.     Right lower leg: No edema.     Left lower leg: No edema.  Skin:    General: Skin is warm and dry.     Capillary Refill: Capillary refill takes less than 2 seconds.  Neurological:     Mental Status: He is alert and oriented to person, place, and time.  Psychiatric:        Attention and Perception: Attention normal.        Mood and Affect: Mood normal.        Speech: Speech normal.        Behavior: Behavior normal. Behavior is cooperative.        Thought Content: Thought content normal.   Results for orders placed or performed in visit on 123456  Basic metabolic panel  Result Value Ref Range   Glucose 84 65 - 99 mg/dL   BUN 16 8 - 27 mg/dL   Creatinine, Ser 1.38 (H) 0.76 - 1.27 mg/dL   GFR calc non Af Amer 54 (L) >59 mL/min/1.73   GFR calc Af Amer 62 >59 mL/min/1.73   BUN/Creatinine Ratio 12 10 - 24   Sodium 140 134 - 144 mmol/L   Potassium 3.9 3.5 - 5.2 mmol/L   Chloride 100 96 - 106 mmol/L   CO2 25 20 - 29 mmol/L   Calcium 9.8 8.6 - 10.2 mg/dL  Lipid Panel w/o Chol/HDL Ratio  Result Value Ref Range   Cholesterol, Total 204 (H) 100 - 199 mg/dL   Triglycerides 70 0 - 149 mg/dL   HDL 78 >39 mg/dL   VLDL Cholesterol Cal 13 5 - 40 mg/dL   LDL Chol Calc (NIH) 113 (H) 0 - 99 mg/dL  CBC with Differential/Platelet   Result Value Ref Range   WBC 6.2 3.4 -  10.8 x10E3/uL   RBC 4.60 4.14 - 5.80 x10E6/uL   Hemoglobin 16.3 13.0 - 17.7 g/dL   Hematocrit 48.1 37.5 - 51.0 %   MCV 105 (H) 79 - 97 fL   MCH 35.4 (H) 26.6 - 33.0 pg   MCHC 33.9 31.5 - 35.7 g/dL   RDW 13.0 11.6 - 15.4 %   Platelets 110 (L) 150 - 450 x10E3/uL   Neutrophils 71 Not Estab. %   Lymphs 16 Not Estab. %   Monocytes 10 Not Estab. %   Eos 1 Not Estab. %   Basos 1 Not Estab. %   Neutrophils Absolute 4.5 1.4 - 7.0 x10E3/uL   Lymphocytes Absolute 1.0 0.7 - 3.1 x10E3/uL   Monocytes Absolute 0.7 0.1 - 0.9 x10E3/uL   EOS (ABSOLUTE) 0.1 0.0 - 0.4 x10E3/uL   Basophils Absolute 0.0 0.0 - 0.2 x10E3/uL   Immature Granulocytes 1 Not Estab. 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  Continue current medication regimen and collaboration with cardiology.  Recommend he monitor BP at least a few mornings a week at home and document.  DASH diet at home.  Notes reviewed.  Recent labs with cardiology reviewed.  TSH check today.  Return in 6 months.      Relevant Medications   rosuvastatin (CRESTOR) 10 MG tablet   Other Relevant Orders   Lipid Panel w/o Chol/HDL Ratio   TSH   Chronic combined systolic and diastolic CHF (congestive heart failure) ([place])    Ongoing and stable at this time. Euvolemic today.  Reviewed recent cardiology note.  Continue current medication regimen and collaboration with cardiology.  Recent labs with cardiology. Recommend: - Reminded to call for an overnight weight gain of >2 pounds or a weekly weight weight of >5 pounds - not adding salt to his food and has been reading food labels. Reviewed the importance of keeping daily sodium intake to '2000mg'$  daily       Relevant Medications   rosuvastatin  (CRESTOR) 10 MG tablet     Respiratory   Severe asthma    Chronic, ongoing.  Continue current medication regimen and collaboration with pulmonary.  Recent noted reviewed.        Extrinsic allergic respiratory disease (HCC)    Chronic, ongoing,  Followed by pulmonary and allergist.  Continue this collaboration and current medication regimen as prescribed by pulmonary.        Obstructive sleep apnea    Chronic, stable.  Continue use of CPAP daily.        Endocrine   IFG (impaired fasting glucose)    Recent labs with normal range glucose, continue to monitor.  Has lost significant weight.        Other   Hyperlipidemia    Chronic, ongoing.  Continue Crestor and supplements daily.  Obtain lipid panel today and alert cardiology to results.      Relevant Medications   rosuvastatin (CRESTOR) 10 MG tablet   Other Relevant Orders   Lipid Panel w/o Chol/HDL Ratio   Obesity    BMI 31.17, has lost significant weight = total of 143 lbs.  Continue keto diet and collaboration with Dr. Leeanne Rio.      ICD (implantable cardioverter-defibrillator)  in place    Continue collaboration with cardiology team at [facility].      Other Visit Diagnoses     Benign prostatic hyperplasia without lower urinary tract symptoms       PSA on labs today.   Relevant Orders   PSA        Follow up plan: Return in about 6 months (around 10/01/2021) for HTN/HLD/HF, ASTHMA. IFG, OSA.

## 2021-03-31 NOTE — Assessment & Plan Note (Signed)
BMI 31.17, has lost significant weight = total of 143 lbs.  Continue keto diet and collaboration with Dr. Leeanne Rio.

## 2021-03-31 NOTE — Assessment & Plan Note (Signed)
Chronic, ongoing.  Continue current medication regimen and collaboration with pulmonary.  Recent noted reviewed.   

## 2021-03-31 NOTE — Assessment & Plan Note (Signed)
Recent labs with normal range glucose, continue to monitor.  Has lost significant weight.

## 2021-03-31 NOTE — Assessment & Plan Note (Signed)
Chronic, stable.  Continue use of CPAP daily.

## 2021-03-31 NOTE — Assessment & Plan Note (Signed)
Continue collaboration with cardiology team at [facility]. 

## 2021-03-31 NOTE — Assessment & Plan Note (Addendum)
Chronic, ongoing with BP at goal today.  Continue current medication regimen and collaboration with cardiology.  Recommend he monitor BP at least a few mornings a week at home and document.  DASH diet at home.  Notes reviewed.  Recent labs with cardiology reviewed.  TSH check today.  Return in 6 months.

## 2021-03-31 NOTE — Assessment & Plan Note (Signed)
Ongoing and stable at this time. Euvolemic today.  Reviewed recent cardiology note.  Continue current medication regimen and collaboration with cardiology.  Recent labs with cardiology. Recommend: - Reminded to call for an overnight weight gain of >2 pounds or a weekly weight weight of >5 pounds - not adding salt to his food and has been reading food labels. Reviewed the importance of keeping daily sodium intake to '2000mg'$  daily

## 2021-03-31 NOTE — Assessment & Plan Note (Signed)
Chronic, ongoing.  Continue Crestor and supplements daily.  Obtain lipid panel today and alert cardiology to results.

## 2021-03-31 NOTE — Patient Instructions (Signed)
https://www.nhlbi.nih.gov/files/docs/public/heart/dash_brief.pdf">  DASH Eating Plan DASH stands for Dietary Approaches to Stop Hypertension. The DASH eating plan is a healthy eating plan that has been shown to: Reduce high blood pressure (hypertension). Reduce your risk for type 2 diabetes, heart disease, and stroke. Help with weight loss. What are tips for following this plan? Reading food labels Check food labels for the amount of salt (sodium) per serving. Choose foods with less than 5 percent of the Daily Value of sodium. Generally, foods with less than 300 milligrams (mg) of sodium per serving fit into this eating plan. To find whole grains, look for the word "whole" as the first word in the ingredient list. Shopping Buy products labeled as "low-sodium" or "no salt added." Buy fresh foods. Avoid canned foods and pre-made or frozen meals. Cooking Avoid adding salt when cooking. Use salt-free seasonings or herbs instead of table salt or sea salt. Check with your health care provider or pharmacist before using salt substitutes. Do not fry foods. Cook foods using healthy methods such as baking, boiling, grilling, roasting, and broiling instead. Cook with heart-healthy oils, such as olive, canola, avocado, soybean, or sunflower oil. Meal planning  Eat a balanced diet that includes: 4 or more servings of fruits and 4 or more servings of vegetables each day. Try to fill one-half of your plate with fruits and vegetables. 6-8 servings of whole grains each day. Less than 6 oz (170 g) of lean meat, poultry, or fish each day. A 3-oz (85-g) serving of meat is about the same size as a deck of cards. One egg equals 1 oz (28 g). 2-3 servings of low-fat dairy each day. One serving is 1 cup (237 mL). 1 serving of nuts, seeds, or beans 5 times each week. 2-3 servings of heart-healthy fats. Healthy fats called omega-3 fatty acids are found in foods such as walnuts, flaxseeds, fortified milks, and eggs.  These fats are also found in cold-water fish, such as sardines, salmon, and mackerel. Limit how much you eat of: Canned or prepackaged foods. Food that is high in trans fat, such as some fried foods. Food that is high in saturated fat, such as fatty meat. Desserts and other sweets, sugary drinks, and other foods with added sugar. Full-fat dairy products. Do not salt foods before eating. Do not eat more than 4 egg yolks a week. Try to eat at least 2 vegetarian meals a week. Eat more home-cooked food and less restaurant, buffet, and fast food.  Lifestyle When eating at a restaurant, ask that your food be prepared with less salt or no salt, if possible. If you drink alcohol: Limit how much you use to: 0-1 drink a day for women who are not pregnant. 0-2 drinks a day for men. Be aware of how much alcohol is in your drink. In the [place] one drink equals one 12 oz bottle of beer (355 mL), one 5 oz glass of wine (148 mL), or one 1 oz glass of hard liquor (44 mL). General information Avoid eating more than 2,300 mg of salt a day. If you have hypertension, you may need to reduce your sodium intake to 1,500 mg a day. Work with your health care provider to maintain a healthy body weight or to lose weight. Ask what an ideal weight is for you. Get at least 30 minutes of exercise that causes your heart to beat faster (aerobic exercise) most days of the week. Activities may include walking, swimming, or biking. Work with your health care provider   or dietitian to adjust your eating plan to your individual calorie needs. What foods should I eat? Fruits All fresh, dried, or frozen fruit. Canned fruit in natural juice (without addedsugar). Vegetables Fresh or frozen vegetables (raw, steamed, roasted, or grilled). Low-sodium or reduced-sodium tomato and vegetable juice. Low-sodium or reduced-sodium tomatosauce and tomato paste. Low-sodium or reduced-sodium canned vegetables. Grains Whole-grain or  whole-wheat bread. Whole-grain or whole-wheat pasta. Brown rice. Oatmeal. Quinoa. Bulgur. Whole-grain and low-sodium cereals. Pita bread.Low-fat, low-sodium crackers. Whole-wheat flour tortillas. Meats and other proteins Skinless chicken or turkey. Ground chicken or turkey. Pork with fat trimmed off. Fish and seafood. Egg whites. Dried beans, peas, or lentils. Unsalted nuts, nut butters, and seeds. Unsalted canned beans. Lean cuts of beef with fat trimmed off. Low-sodium, lean precooked or cured meat, such as sausages or meatloaves. Dairy Low-fat (1%) or fat-free (skim) milk. Reduced-fat, low-fat, or fat-free cheeses. Nonfat, low-sodium ricotta or cottage cheese. Low-fat or nonfatyogurt. Low-fat, low-sodium cheese. Fats and oils Soft margarine without trans fats. Vegetable oil. Reduced-fat, low-fat, or light mayonnaise and salad dressings (reduced-sodium). Canola, safflower, olive, avocado, soybean, andsunflower oils. Avocado. Seasonings and condiments Herbs. Spices. Seasoning mixes without salt. Other foods Unsalted popcorn and pretzels. Fat-free sweets. The items listed above may not be a complete list of foods and beverages you can eat. Contact a dietitian for more information. What foods should I avoid? Fruits Canned fruit in a light or heavy syrup. Fried fruit. Fruit in cream or buttersauce. Vegetables Creamed or fried vegetables. Vegetables in a cheese sauce. Regular canned vegetables (not low-sodium or reduced-sodium). Regular canned tomato sauce and paste (not low-sodium or reduced-sodium). Regular tomato and vegetable juice(not low-sodium or reduced-sodium). Pickles. Olives. Grains Baked goods made with fat, such as croissants, muffins, or some breads. Drypasta or rice meal packs. Meats and other proteins Fatty cuts of meat. Ribs. Fried meat. Bacon. Bologna, salami, and other precooked or cured meats, such as sausages or meat loaves. Fat from the back of a pig (fatback). Bratwurst.  Salted nuts and seeds. Canned beans with added salt. Canned orsmoked fish. Whole eggs or egg yolks. Chicken or turkey with skin. Dairy Whole or 2% milk, cream, and half-and-half. Whole or full-fat cream cheese. Whole-fat or sweetened yogurt. Full-fat cheese. Nondairy creamers. Whippedtoppings. Processed cheese and cheese spreads. Fats and oils Butter. Stick margarine. Lard. Shortening. Ghee. Bacon fat. Tropical oils, suchas coconut, palm kernel, or palm oil. Seasonings and condiments Onion salt, garlic salt, seasoned salt, table salt, and sea salt. Worcestershire sauce. Tartar sauce. Barbecue sauce. Teriyaki sauce. Soy sauce, including reduced-sodium. Steak sauce. Canned and packaged gravies. Fish sauce. Oyster sauce. Cocktail sauce. Store-bought horseradish. Ketchup. Mustard. Meat flavorings and tenderizers. Bouillon cubes. Hot sauces. Pre-made or packaged marinades. Pre-made or packaged taco seasonings. Relishes. Regular saladdressings. Other foods Salted popcorn and pretzels. The items listed above may not be a complete list of foods and beverages you should avoid. Contact a dietitian for more information. Where to find more information National Heart, Lung, and Blood Institute: www.nhlbi.nih.gov American Heart Association: www.heart.org Academy of Nutrition and Dietetics: www.eatright.org National Kidney Foundation: www.kidney.org Summary The DASH eating plan is a healthy eating plan that has been shown to reduce high blood pressure (hypertension). It may also reduce your risk for type 2 diabetes, heart disease, and stroke. When on the DASH eating plan, aim to eat more fresh fruits and vegetables, whole grains, lean proteins, low-fat dairy, and heart-healthy fats. With the DASH eating plan, you should limit salt (sodium) intake to 2,300   mg a day. If you have hypertension, you may need to reduce your sodium intake to 1,500 mg a day. Work with your health care provider or dietitian to adjust  your eating plan to your individual calorie needs. This information is not intended to replace advice given to you by your health care provider. Make sure you discuss any questions you have with your healthcare provider. Document Revised: 07/04/2019 Document Reviewed: 07/04/2019 Elsevier Patient Education  2022 Elsevier Inc.  

## 2021-04-01 ENCOUNTER — Other Ambulatory Visit: Payer: Self-pay | Admitting: Nurse Practitioner

## 2021-04-01 DIAGNOSIS — R7989 Other specified abnormal findings of blood chemistry: Secondary | ICD-10-CM

## 2021-04-01 LAB — LIPID PANEL W/O CHOL/HDL RATIO
Cholesterol, Total: 187 mg/dL (ref 100–199)
HDL: 66 mg/dL (ref >39)
LDL Chol Calc (NIH): 108 mg/dL — ABNORMAL HIGH (ref 0–99)
Triglycerides: 68 mg/dL (ref 0–149)
VLDL Cholesterol Cal: 13 mg/dL (ref 5–40)

## 2021-04-01 LAB — PSA: Prostate Specific Ag, Serum: 0.6 ng/mL (ref 0.0–4.0)

## 2021-04-01 LAB — TSH: TSH: 0.138 u[IU]/mL — ABNORMAL LOW (ref 0.450–4.500)

## 2021-04-01 NOTE — Progress Notes (Signed)
Contacted via MyChart   Good evening Thomas Calhoun, your labs have returned.  LDL, bad cholesterol, continues above goal.  Would like to see a bit lower.  I would recommend increasing your Rosuvastatin to 20 MG, would you be okay with this dose change.  Then we can recheck next visit.  Prostate screening is normal.  Thyroid lab is on low side this check.  I recommend an outpatient lab only visit in 6 weeks to recheck this.  Will order lab, please call office Monday to schedule lab only visit.  Any questions? Keep being awesome!!  Thank you for allowing me to participate in your care.  I appreciate you. Kindest regards, Laurelyn Terrero

## 2021-05-08 DIAGNOSIS — Z95810 Presence of automatic (implantable) cardiac defibrillator: Secondary | ICD-10-CM | POA: Diagnosis not present

## 2021-05-15 DIAGNOSIS — Z95810 Presence of automatic (implantable) cardiac defibrillator: Secondary | ICD-10-CM | POA: Diagnosis not present

## 2021-06-08 DIAGNOSIS — N1831 Chronic kidney disease, stage 3a: Secondary | ICD-10-CM | POA: Diagnosis not present

## 2021-06-08 DIAGNOSIS — Z79899 Other long term (current) drug therapy: Secondary | ICD-10-CM | POA: Diagnosis not present

## 2021-06-08 DIAGNOSIS — Z95810 Presence of automatic (implantable) cardiac defibrillator: Secondary | ICD-10-CM | POA: Diagnosis not present

## 2021-06-08 DIAGNOSIS — I472 Ventricular tachycardia, unspecified: Secondary | ICD-10-CM | POA: Diagnosis not present

## 2021-06-08 DIAGNOSIS — E785 Hyperlipidemia, unspecified: Secondary | ICD-10-CM | POA: Diagnosis not present

## 2021-06-08 DIAGNOSIS — I5042 Chronic combined systolic (congestive) and diastolic (congestive) heart failure: Secondary | ICD-10-CM | POA: Diagnosis not present

## 2021-06-08 DIAGNOSIS — Z5181 Encounter for therapeutic drug level monitoring: Secondary | ICD-10-CM | POA: Diagnosis not present

## 2021-06-08 DIAGNOSIS — Z4502 Encounter for adjustment and management of automatic implantable cardiac defibrillator: Secondary | ICD-10-CM | POA: Diagnosis not present

## 2021-07-14 DIAGNOSIS — J324 Chronic pansinusitis: Secondary | ICD-10-CM | POA: Diagnosis not present

## 2021-07-14 DIAGNOSIS — Z6829 Body mass index (BMI) 29.0-29.9, adult: Secondary | ICD-10-CM | POA: Diagnosis not present

## 2021-07-14 DIAGNOSIS — J310 Chronic rhinitis: Secondary | ICD-10-CM | POA: Diagnosis not present

## 2021-07-15 DIAGNOSIS — Z880 Allergy status to penicillin: Secondary | ICD-10-CM | POA: Diagnosis not present

## 2021-07-15 DIAGNOSIS — J4550 Severe persistent asthma, uncomplicated: Secondary | ICD-10-CM | POA: Diagnosis not present

## 2021-07-15 DIAGNOSIS — J339 Nasal polyp, unspecified: Secondary | ICD-10-CM | POA: Diagnosis not present

## 2021-07-27 DIAGNOSIS — I10 Essential (primary) hypertension: Secondary | ICD-10-CM | POA: Diagnosis not present

## 2021-07-27 DIAGNOSIS — R635 Abnormal weight gain: Secondary | ICD-10-CM | POA: Diagnosis not present

## 2021-09-12 DIAGNOSIS — Z95810 Presence of automatic (implantable) cardiac defibrillator: Secondary | ICD-10-CM | POA: Diagnosis not present

## 2021-10-02 NOTE — Patient Instructions (Signed)
Asthma, Adult ?Asthma is a long-term (chronic) condition in which the airways get tight and narrow. The airways are the breathing passages that lead from the nose and mouth down into the lungs. A person with asthma will have times when symptoms get worse. These are called asthma attacks. They can cause coughing, whistling sounds when you breathe (wheezing), shortness of breath, and chest pain. They can make it hard to breathe. There is no cure for asthma, but medicines and lifestyle changes can help control it. ?There are many things that can bring on an asthma attack or make asthma symptoms worse (triggers). Common triggers include: ?Mold. ?Dust. ?Cigarette smoke. ?Cockroaches. ?Things that can cause allergy symptoms (allergens). These include animal skin flakes (dander) and pollen from trees or grass. ?Things that pollute the air. These may include household cleaners, wood smoke, smog, or chemical odors. ?Cold air, weather changes, and wind. ?Crying or laughing hard. ?Stress. ?Certain medicines or drugs. ?Certain foods such as dried fruit, potato chips, and grape juice. ?Infections, such as a cold or the flu. ?Certain medical conditions or diseases. ?Exercise or tiring activities. ?Asthma may be treated with medicines and by staying away from the things that cause asthma attacks. Types of medicines may include: ?Controller medicines. These help prevent asthma symptoms. They are usually taken every day. ?Fast-acting reliever or rescue medicines. These quickly relieve asthma symptoms. They are used as needed and provide short-term relief. ?Allergy medicines if your attacks are brought on by allergens. ?Medicines to help control the body's defense (immune) system. ?Follow these instructions at home: ?Avoiding triggers in your home ?Change your heating and air conditioning filter often. ?Limit your use of fireplaces and wood stoves. ?Get rid of pests (such as roaches and mice) and their droppings. ?Throw away plants  if you see mold on them. ?Clean your floors. Dust regularly. Use cleaning products that do not smell. ?Have someone vacuum when you are not home. Use a vacuum cleaner with a HEPA filter if possible. ?Replace carpet with wood, tile, or vinyl flooring. Carpet can trap animal skin flakes and dust. ?Use allergy-proof pillows, mattress covers, and box spring covers. ?Wash bed sheets and blankets every week in hot water. Dry them in a dryer. ?Keep your bedroom free of any triggers. ?Avoid pets and keep windows closed when things that cause allergy symptoms are in the air. ?Use blankets that are made of polyester or cotton. ?Clean bathrooms and kitchens with bleach. If possible, have someone repaint the walls in these rooms with mold-resistant paint. Keep out of the rooms that are being cleaned and painted. ?Wash your hands often with soap and water. If soap and water are not available, use hand sanitizer. ?Do not allow anyone to smoke in your home. ?General instructions ?Take over-the-counter and prescription medicines only as told by your doctor. ?Talk with your doctor if you have questions about how or when to take your medicines. ?Make note if you need to use your medicines more often than usual. ?Do not use any products that contain nicotine or tobacco, such as cigarettes and e-cigarettes. If you need help quitting, ask your doctor. ?Stay away from secondhand smoke. ?Avoid doing things outdoors when allergen counts are high and when air quality is low. ?Wear a ski mask when doing outdoor activities in the winter. The mask should cover your nose and mouth. Exercise indoors on cold days if you can. ?Warm up before you exercise. Take time to cool down after exercise. ?Use a peak flow meter as   told by your doctor. A peak flow meter is a tool that measures how well the lungs are working. ?Keep track of the peak flow meter's readings. Write them down. ?Follow your asthma action plan. This is a written plan for taking care  of your asthma and treating your attacks. ?Make sure you get all the shots (vaccines) that your doctor recommends. Ask your doctor about a flu shot and a pneumonia shot. ?Keep all follow-up visits as told by your doctor. This is important. ?Contact a doctor if: ?You have wheezing, shortness of breath, or a cough even while taking medicine to prevent attacks. ?The mucus you cough up (sputum) is thicker than usual. ?The mucus you cough up changes from clear or white to yellow, green, gray, or bloody. ?You have problems from the medicine you are taking, such as: ?A rash. ?Itching. ?Swelling. ?Trouble breathing. ?You need reliever medicines more than 2-3 times a week. ?Your peak flow reading is still at 50-79% of your personal best after following the action plan for 1 hour. ?You have a fever. ?Get help right away if: ?You seem to be worse and are not responding to medicine during an asthma attack. ?You are short of breath even at rest. ?You get short of breath when doing very little activity. ?You have trouble eating, drinking, or talking. ?You have chest pain or tightness. ?You have a fast heartbeat. ?Your lips or fingernails start to turn blue. ?You are light-headed or dizzy, or you faint. ?Your peak flow is less than 50% of your personal best. ?You feel too tired to breathe normally. ?Summary ?Asthma is a long-term (chronic) condition in which the airways get tight and narrow. An asthma attack can make it hard to breathe. ?Asthma cannot be cured, but medicines and lifestyle changes can help control it. ?Make sure you understand how to avoid triggers and how and when to use your medicines. ?This information is not intended to replace advice given to you by your health care provider. Make sure you discuss any questions you have with your health care provider. ?Document Revised: 11/23/2019 Document Reviewed: 12/03/2019 ?Elsevier Patient Education ? 2022 [employer]vier Inc. ? ?

## 2021-10-06 ENCOUNTER — Ambulatory Visit (INDEPENDENT_AMBULATORY_CARE_PROVIDER_SITE_OTHER): Payer: BC Managed Care – PPO | Admitting: Nurse Practitioner

## 2021-10-06 ENCOUNTER — Other Ambulatory Visit: Payer: Self-pay

## 2021-10-06 ENCOUNTER — Encounter: Payer: Self-pay | Admitting: Nurse Practitioner

## 2021-10-06 VITALS — BP 108/68 | HR 60 | Temp 98.2°F | Wt 213.2 lb

## 2021-10-06 DIAGNOSIS — Z23 Encounter for immunization: Secondary | ICD-10-CM | POA: Diagnosis not present

## 2021-10-06 DIAGNOSIS — Z95810 Presence of automatic (implantable) cardiac defibrillator: Secondary | ICD-10-CM

## 2021-10-06 DIAGNOSIS — E782 Mixed hyperlipidemia: Secondary | ICD-10-CM | POA: Diagnosis not present

## 2021-10-06 DIAGNOSIS — I110 Hypertensive heart disease with heart failure: Secondary | ICD-10-CM

## 2021-10-06 DIAGNOSIS — I5042 Chronic combined systolic (congestive) and diastolic (congestive) heart failure: Secondary | ICD-10-CM | POA: Diagnosis not present

## 2021-10-06 DIAGNOSIS — Z114 Encounter for screening for human immunodeficiency virus [HIV]: Secondary | ICD-10-CM | POA: Diagnosis not present

## 2021-10-06 DIAGNOSIS — J709 Respiratory conditions due to unspecified external agent: Secondary | ICD-10-CM

## 2021-10-06 DIAGNOSIS — J4550 Severe persistent asthma, uncomplicated: Secondary | ICD-10-CM | POA: Diagnosis not present

## 2021-10-06 DIAGNOSIS — E6609 Other obesity due to excess calories: Secondary | ICD-10-CM

## 2021-10-06 DIAGNOSIS — G4733 Obstructive sleep apnea (adult) (pediatric): Secondary | ICD-10-CM

## 2021-10-06 DIAGNOSIS — E0590 Thyrotoxicosis, unspecified without thyrotoxic crisis or storm: Secondary | ICD-10-CM | POA: Insufficient documentation

## 2021-10-06 DIAGNOSIS — Z1159 Encounter for screening for other viral diseases: Secondary | ICD-10-CM

## 2021-10-06 DIAGNOSIS — R7301 Impaired fasting glucose: Secondary | ICD-10-CM | POA: Diagnosis not present

## 2021-10-06 DIAGNOSIS — Z6831 Body mass index (BMI) 31.0-31.9, adult: Secondary | ICD-10-CM

## 2021-10-06 LAB — MICROALBUMIN, URINE WAIVED
Creatinine, Urine Waived: 200 mg/dL (ref 10–300)
Microalb, Ur Waived: 10 mg/L (ref 0–19)
Microalb/Creat Ratio: <30 mg/g (ref <30)

## 2021-10-06 LAB — BAYER DCA HB A1C WAIVED: HB A1C (BAYER DCA - WAIVED): 5 % (ref 4.8–5.6)

## 2021-10-06 MED ORDER — AZELASTINE HCL 0.1 % NA SOLN: 2.0000 | Freq: Two times a day (BID) | NASAL | 5 refills | Status: AC

## 2021-10-06 NOTE — Assessment & Plan Note (Addendum)
Ongoing low levels on cardiology labs in October, discussed with patient at length.  Recheck today and refer to endocrinology + obtain imaging of thyroid if ongoing.

## 2021-10-06 NOTE — Assessment & Plan Note (Signed)
Chronic, ongoing.  Continue current medication regimen and collaboration with pulmonary.  Recent noted reviewed.   

## 2021-10-06 NOTE — Assessment & Plan Note (Signed)
BMI 31.43, has lost significant weight = total of 150 lbs.  Continue keto diet and collaboration with Dr. Leeanne Rio.

## 2021-10-06 NOTE — Assessment & Plan Note (Signed)
Recent labs with normal range glucose, continue to monitor.  Has lost significant weight.

## 2021-10-06 NOTE — Assessment & Plan Note (Signed)
Continue collaboration with cardiology team at [facility]. 

## 2021-10-06 NOTE — Assessment & Plan Note (Signed)
Chronic, ongoing.  Continue Crestor and supplements daily.  Obtain lipid panel today and alert cardiology to results.

## 2021-10-06 NOTE — Assessment & Plan Note (Signed)
Ongoing and stable at this time. Euvolemic today.  Reviewed recent cardiology note.  Continue current medication regimen and collaboration with cardiology.  Recent labs with cardiology. Recommend: - Reminded to call for an overnight weight gain of >2 pounds or a weekly weight weight of >5 pounds - not adding salt to his food and has been reading food labels. Reviewed the importance of keeping daily sodium intake to 2000mg  daily  - Avoid Ibuprofen products

## 2021-10-06 NOTE — Assessment & Plan Note (Addendum)
Chronic, ongoing,  Followed by pulmonary and allergist.  Continue this collaboration and current medication regimen as prescribed by pulmonary.  Obtain CBC today per allergist request on recent note review.

## 2021-10-06 NOTE — Assessment & Plan Note (Signed)
Chronic, stable.  Continue use of CPAP daily.

## 2021-10-06 NOTE — Progress Notes (Signed)
BP 108/68    Pulse 60    Temp 98.2 F (36.8 C) (Oral)    Wt 213 lb 3.2 oz (96.7 kg)    SpO2 91%    BMI 31.43 kg/m    Subjective:    Patient ID: Thomas Calhoun, male    DOB: 1956-06-17, 66 y.o.   MRN: [id number]  HPI: Thomas Calhoun is a 65 y.o. male  Chief Complaint  Patient presents with   Hyperlipidemia   Hypertension   Asthma   Sleep Apnea        Congestive Heart Failure   IFG   HYPERTENSION / HYPERLIPIDEMIA & CHF  Last saw [place] [facility] 06/08/21, last.  ICD in place -- May 2020, last check 09/12/21.  History of IFG with recent glucose stable.  Last labs in October 2022 at [facility] cardiology = TSH <0.01 and Free T4 2.36 = he has been eating carrots to help this.    Continues Metoprolol, Amiodarone, Losartan and Spironolactone + Crestor. Has lost > 150 pounds total, continues to work with Dr. Enrigue Catena -- last saw 07/27/21. Satisfied with current treatment? yes  Duration of hypertension: chronic  BP monitoring frequency: not checking BP range:  BP medication side effects: no  Duration of hyperlipidemia: chronic   Cholesterol supplements: none  Aspirin: no  Recent stressors: no  Recurrent headaches: no  Visual changes: no  Palpitations: no  Dyspnea: no  Chest pain: no  Lower extremity edema: no  Dizzy/lightheaded: no  The 10-year ASCVD risk score (Arnett DK, et al., 2019) is: 9.5%   Values used to calculate the score:     Age: 50 years     Sex: Male     Is Non-Hispanic African American: No     Diabetic: No     Tobacco smoker: No     Systolic Blood Pressure: 086 mmHg     Is BP treated: Yes     HDL Cholesterol: 54 mg/dL     Total Cholesterol: 192 mg/dL  ASTHMA  Saw Dr. Raul Del on 10/25/20 and allergist at [facility] 07/15/20. Theophylline discontinued by Dr. Raul Del === continues on Nucala, Advair, Albuterol, Claritin, Singulair, Spiriva.  He is also followed by ENT and Allergist at [facility] = recently seen December 2022 by both. Allergist would like CBC on labs  today.  Has underlying OSA and uses CPAP 100% of the time.  Asthma status: stable  Satisfied with current treatment?: yes  Albuterol/rescue inhaler frequency:  Dyspnea frequency: none  Wheezing frequency: occasional  Cough frequency: none  Nocturnal symptom frequency:  Limitation of activity: no  Current upper respiratory symptoms: no  Triggers: seasonal  Home peak flows: none  Last Spirometry: with pulmonary  Failed/intolerant to following asthma meds:  Asthma meds in past: unknown  Aerochamber/spacer use: no  Visits to ER or Urgent Care in past year: no  Pneumovax: Not up to Date  Influenza: Up to Date   Depression screen [facility] 2/9 10/06/2021 07/20/2020 06/30/2019 01/04/2018 12/05/2016  Decreased Interest 0 0 0 0 0  Down, Depressed, Hopeless 0 0 0 0 0  PHQ - 2 Score 0 0 0 0 0  Altered sleeping 0 0 0 - -  Tired, decreased energy 0 0 0 - -  Change in appetite 0 0 0 - -  Feeling bad or failure about yourself  0 0 0 - -  Trouble concentrating 0 0 0 - -  Moving slowly or fidgety/restless 0 0 0 - -  Suicidal thoughts  0 0 0 - -  PHQ-9 Score 0 0 0 - -  Difficult doing work/chores Not difficult at all Not difficult at all - - -     Relevant past medical, surgical, family and social history reviewed and updated as indicated. Interim medical history since our last visit reviewed. Allergies and medications reviewed and updated.  Review of Systems  Constitutional:  Negative for activity change, diaphoresis, fatigue and fever.  Respiratory:  Negative for cough, chest tightness, shortness of breath and wheezing.   Cardiovascular:  Negative for chest pain, palpitations and leg swelling.  Gastrointestinal: Negative.   Neurological: Negative.   Psychiatric/Behavioral: Negative.     Per HPI unless specifically indicated above     Objective:    BP 108/68    Pulse 60    Temp 98.2 F (36.8 C) (Oral)    Wt 213 lb 3.2 oz (96.7 kg)    SpO2 91%    BMI 31.43 kg/m   Wt Readings from Last 3  Encounters:  10/06/21 213 lb 3.2 oz (96.7 kg)  03/31/21 211 lb 6.4 oz (95.9 kg)  07/20/20 242 lb 12.8 oz (110.1 kg)    Physical Exam Vitals and nursing note reviewed.  Constitutional:      General: He is awake. He is not in acute distress.    Appearance: He is well-developed and well-groomed. He is obese. He is not ill-appearing.  HENT:     Head: Normocephalic and atraumatic.     Right Ear: Hearing, tympanic membrane, ear canal and external ear normal. No drainage.     Left Ear: Hearing, tympanic membrane, ear canal and external ear normal. No drainage.  Eyes:     General: Lids are normal.        Right eye: No discharge.        Left eye: No discharge.     Conjunctiva/sclera: Conjunctivae normal.     Pupils: Pupils are equal, round, and reactive to light.  Neck:     Thyroid: No thyromegaly.     Vascular: No carotid bruit.     Trachea: Trachea normal.  Cardiovascular:     Rate and Rhythm: Normal rate and regular rhythm.     Heart sounds: Normal heart sounds, S1 normal and S2 normal. No murmur heard.   No gallop.  Pulmonary:     Effort: Pulmonary effort is normal. No accessory muscle usage or respiratory distress.     Breath sounds: Normal breath sounds.  Abdominal:     General: Bowel sounds are normal.     Palpations: Abdomen is soft.  Musculoskeletal:        General: Normal range of motion.     Cervical back: Normal range of motion and neck supple.     Right lower leg: No edema.     Left lower leg: No edema.  Skin:    General: Skin is warm and dry.     Capillary Refill: Capillary refill takes less than 2 seconds.  Neurological:     Mental Status: He is alert and oriented to person, place, and time.  Psychiatric:        Attention and Perception: Attention normal.        Mood and Affect: Mood normal.        Speech: Speech normal.        Behavior: Behavior normal. Behavior is cooperative.        Thought Content: Thought content normal.   Results for orders placed or  performed in  visit on 10/06/21  Bayer DCA Hb A1c Waived  Result Value Ref Range   HB A1C (BAYER DCA - WAIVED) 5.0 4.8 - 5.6 %  Microalbumin, Urine Waived  Result Value Ref Range   Microalb, Ur Waived 10 0 - 19 mg/L   Creatinine, Urine Waived 200 10 - 300 mg/dL   Microalb/Creat Ratio <30 <30 mg/g      Assessment & Plan:   Problem List Items Addressed This Visit       Cardiovascular and Mediastinum   Chronic combined systolic and diastolic CHF (congestive heart failure) ([place])    Ongoing and stable at this time. Euvolemic today.  Reviewed recent cardiology note.  Continue current medication regimen and collaboration with cardiology.  Recent labs with cardiology. Recommend: - Reminded to call for an overnight weight gain of >2 pounds or a weekly weight weight of >5 pounds - not adding salt to his food and has been reading food labels. Reviewed the importance of keeping daily sodium intake to 2000mg  daily  - Avoid Ibuprofen products      Relevant Orders   Microalbumin, Urine Waived (Completed)   Comprehensive metabolic panel   CBC with Differential/Platelet   Lipid Panel w/o Chol/HDL Ratio   Hypertensive heart disease with HF (heart failure) (HCC) - Primary    Chronic, ongoing with BP at goal today.  Continue current medication regimen and collaboration with cardiology.  Recommend he monitor BP at least a few mornings a week at home and document.  DASH diet at home.  Notes reviewed.  Recent labs with cardiology reviewed.  TSH recheck today, may need to send to endo if ongoing lows.  CBC and CMP today + urine ALB.  Return in 6 months.      Relevant Orders   Microalbumin, Urine Waived (Completed)   Comprehensive metabolic panel     Respiratory   Extrinsic allergic respiratory disease (HCC)    Chronic, ongoing,  Followed by pulmonary and allergist.  Continue this collaboration and current medication regimen as prescribed by pulmonary.  Obtain CBC today per allergist request on recent  note review.      Relevant Orders   CBC with Differential/Platelet   Obstructive sleep apnea    Chronic, stable.  Continue use of CPAP daily.      Severe asthma    Chronic, ongoing.  Continue current medication regimen and collaboration with pulmonary.  Recent noted reviewed.        Relevant Orders   CBC with Differential/Platelet     Endocrine   Hyperthyroidism    Ongoing low levels on cardiology labs in October, discussed with patient at length.  Recheck today and refer to endocrinology + obtain imaging of thyroid if ongoing.      Relevant Orders   TSH   T4, free   IFG (impaired fasting glucose)    Recent labs with normal range glucose, continue to monitor.  Has lost significant weight.      Relevant Orders   Bayer DCA Hb A1c Waived (Completed)   Microalbumin, Urine Waived (Completed)     Other   Hyperlipidemia    Chronic, ongoing.  Continue Crestor and supplements daily.  Obtain lipid panel today and alert cardiology to results.      Relevant Orders   Comprehensive metabolic panel   Lipid Panel w/o Chol/HDL Ratio   ICD (implantable cardioverter-defibrillator) in place    Continue collaboration with cardiology team at [facility].      Obesity    BMI  31.43, has lost significant weight = total of 150 lbs.  Continue keto diet and collaboration with Dr. Leeanne Rio.      Other Visit Diagnoses     Need for hepatitis C screening test       Hep C screening on labs today, discussed with patient.   Relevant Orders   Hepatitis C antibody   Encounter for screening for HIV       HIVscreening on labs today, discussed with patient.   Relevant Orders   HIV Antibody (routine testing w rflx)   Need for Td vaccine       Td vaccine in office today.   Relevant Orders   Td vaccine greater than or equal to 7yo preservative free IM (Completed)        Follow up plan: Return in about 6 months (around 04/05/2022) for HTN/HLD/HF, OSA, ASTHMA.

## 2021-10-06 NOTE — Assessment & Plan Note (Signed)
Chronic, ongoing with BP at goal today.  Continue current medication regimen and collaboration with cardiology.  Recommend he monitor BP at least a few mornings a week at home and document.  DASH diet at home.  Notes reviewed.  Recent labs with cardiology reviewed.  TSH recheck today, may need to send to endo if ongoing lows.  CBC and CMP today + urine ALB.  Return in 6 months.

## 2021-10-07 ENCOUNTER — Other Ambulatory Visit: Payer: Self-pay | Admitting: Nurse Practitioner

## 2021-10-07 DIAGNOSIS — D696 Thrombocytopenia, unspecified: Secondary | ICD-10-CM | POA: Insufficient documentation

## 2021-10-07 DIAGNOSIS — E0590 Thyrotoxicosis, unspecified without thyrotoxic crisis or storm: Secondary | ICD-10-CM

## 2021-10-07 LAB — COMPREHENSIVE METABOLIC PANEL
ALT: 27 IU/L (ref 0–44)
AST: 26 IU/L (ref 0–40)
Albumin/Globulin Ratio: 2.2 (ref 1.2–2.2)
Albumin: 4.6 g/dL (ref 3.8–4.8)
Alkaline Phosphatase: 65 IU/L (ref 44–121)
BUN/Creatinine Ratio: 16 (ref 10–24)
BUN: 16 mg/dL (ref 8–27)
Bilirubin Total: 1 mg/dL (ref 0.0–1.2)
CO2: 24 mmol/L (ref 20–29)
Calcium: 9.3 mg/dL (ref 8.6–10.2)
Chloride: 104 mmol/L (ref 96–106)
Creatinine, Ser: 1.01 mg/dL (ref 0.76–1.27)
Globulin, Total: 2.1 g/dL (ref 1.5–4.5)
Glucose: 96 mg/dL (ref 70–99)
Potassium: 4.1 mmol/L (ref 3.5–5.2)
Sodium: 141 mmol/L (ref 134–144)
Total Protein: 6.7 g/dL (ref 6.0–8.5)
eGFR: 83 mL/min/{1.73_m2} (ref >59)

## 2021-10-07 LAB — CBC WITH DIFFERENTIAL/PLATELET
Basophils Absolute: 0.1 10*3/uL (ref 0.0–0.2)
Basos: 1 %
EOS (ABSOLUTE): 0.2 10*3/uL (ref 0.0–0.4)
Eos: 3 %
Hematocrit: 47.5 % (ref 37.5–51.0)
Hemoglobin: 16.2 g/dL (ref 13.0–17.7)
Immature Grans (Abs): 0 10*3/uL (ref 0.0–0.1)
Immature Granulocytes: 0 %
Lymphocytes Absolute: 0.9 10*3/uL (ref 0.7–3.1)
Lymphs: 18 %
MCH: 33.8 pg — ABNORMAL HIGH (ref 26.6–33.0)
MCHC: 34.1 g/dL (ref 31.5–35.7)
MCV: 99 fL — ABNORMAL HIGH (ref 79–97)
Monocytes Absolute: 0.7 10*3/uL (ref 0.1–0.9)
Monocytes: 13 %
Neutrophils Absolute: 3.4 10*3/uL (ref 1.4–7.0)
Neutrophils: 65 %
Platelets: 97 10*3/uL — CL (ref 150–450)
RBC: 4.79 x10E6/uL (ref 4.14–5.80)
RDW: 12.9 % (ref 11.6–15.4)
WBC: 5.2 10*3/uL (ref 3.4–10.8)

## 2021-10-07 LAB — TSH: TSH: 8.05 u[IU]/mL — ABNORMAL HIGH (ref 0.450–4.500)

## 2021-10-07 LAB — LIPID PANEL W/O CHOL/HDL RATIO
Cholesterol, Total: 204 mg/dL — ABNORMAL HIGH (ref 100–199)
HDL: 74 mg/dL (ref >39)
LDL Chol Calc (NIH): 118 mg/dL — ABNORMAL HIGH (ref 0–99)
Triglycerides: 66 mg/dL (ref 0–149)
VLDL Cholesterol Cal: 12 mg/dL (ref 5–40)

## 2021-10-07 LAB — HEPATITIS C ANTIBODY: Hep C Virus Ab: NONREACTIVE

## 2021-10-07 LAB — T4, FREE: Free T4: 1.21 ng/dL (ref 0.82–1.77)

## 2021-10-07 LAB — HIV ANTIBODY (ROUTINE TESTING W REFLEX): HIV Screen 4th Generation wRfx: NONREACTIVE

## 2021-10-07 NOTE — Progress Notes (Signed)
Contacted via MyChart -- needs 2 weeks repeat lab only visit Good evening Nicole Kindred, your labs have returned: - CBC overall remains stable with exception of ongoing lower level of platelets which have been ongoing since May 2020, although this check is lower.  I would like to recheck in 2 weeks with an outpatient lab only visit.  Staff will call to schedule. - Cholesterol labs remain elevated, I would recommend increasing your Rosuvastatin to 20 MG, thoughts? - Thyroid lab this check is high, more hypothyroid (sluggish), previous two were more low (hyperthyroid) -- I would like to recheck this in 2 weeks on outpatient labs too.  Free T4 is normal. - Kidney function, creatinine and eGFR, remains normal, as is liver function, AST and ALT.   - Hep C and HIV are negative.  Any questions? Keep being stellar!!  Thank you for allowing me to participate in your care.  I appreciate you. Kindest regards, Griffen Frayne

## 2021-10-11 ENCOUNTER — Encounter: Payer: Self-pay | Admitting: Nurse Practitioner

## 2021-10-24 ENCOUNTER — Other Ambulatory Visit: Payer: BC Managed Care – PPO

## 2021-10-25 ENCOUNTER — Encounter: Payer: Self-pay | Admitting: Nurse Practitioner

## 2021-10-25 DIAGNOSIS — E079 Disorder of thyroid, unspecified: Secondary | ICD-10-CM | POA: Diagnosis not present

## 2021-10-25 DIAGNOSIS — I5033 Acute on chronic diastolic (congestive) heart failure: Secondary | ICD-10-CM | POA: Diagnosis not present

## 2021-10-25 DIAGNOSIS — I10 Essential (primary) hypertension: Secondary | ICD-10-CM | POA: Diagnosis not present

## 2021-10-25 DIAGNOSIS — I110 Hypertensive heart disease with heart failure: Secondary | ICD-10-CM | POA: Diagnosis not present

## 2021-10-25 DIAGNOSIS — I130 Hypertensive heart and chronic kidney disease with heart failure and stage 1 through stage 4 chronic kidney disease, or unspecified chronic kidney disease: Secondary | ICD-10-CM | POA: Diagnosis not present

## 2021-10-25 DIAGNOSIS — E785 Hyperlipidemia, unspecified: Secondary | ICD-10-CM | POA: Diagnosis not present

## 2021-10-25 DIAGNOSIS — E669 Obesity, unspecified: Secondary | ICD-10-CM | POA: Diagnosis not present

## 2021-10-25 DIAGNOSIS — G4733 Obstructive sleep apnea (adult) (pediatric): Secondary | ICD-10-CM | POA: Diagnosis not present

## 2021-10-25 DIAGNOSIS — Z79899 Other long term (current) drug therapy: Secondary | ICD-10-CM | POA: Diagnosis not present

## 2021-10-25 DIAGNOSIS — Z6830 Body mass index (BMI) 30.0-30.9, adult: Secondary | ICD-10-CM | POA: Diagnosis not present

## 2021-10-25 DIAGNOSIS — I5042 Chronic combined systolic (congestive) and diastolic (congestive) heart failure: Secondary | ICD-10-CM | POA: Diagnosis not present

## 2021-10-25 DIAGNOSIS — Z7722 Contact with and (suspected) exposure to environmental tobacco smoke (acute) (chronic): Secondary | ICD-10-CM | POA: Diagnosis not present

## 2021-10-25 DIAGNOSIS — I472 Ventricular tachycardia, unspecified: Secondary | ICD-10-CM | POA: Diagnosis not present

## 2021-10-25 DIAGNOSIS — N1831 Chronic kidney disease, stage 3a: Secondary | ICD-10-CM | POA: Diagnosis not present

## 2021-10-31 DIAGNOSIS — H903 Sensorineural hearing loss, bilateral: Secondary | ICD-10-CM | POA: Diagnosis not present

## 2021-10-31 DIAGNOSIS — H9390 Unspecified disorder of ear, unspecified ear: Secondary | ICD-10-CM | POA: Diagnosis not present

## 2021-11-04 DIAGNOSIS — I10 Essential (primary) hypertension: Secondary | ICD-10-CM | POA: Diagnosis not present

## 2021-11-04 DIAGNOSIS — I5042 Chronic combined systolic (congestive) and diastolic (congestive) heart failure: Secondary | ICD-10-CM | POA: Diagnosis not present

## 2021-11-04 DIAGNOSIS — I472 Ventricular tachycardia, unspecified: Secondary | ICD-10-CM | POA: Diagnosis not present

## 2021-11-04 DIAGNOSIS — N1831 Chronic kidney disease, stage 3a: Secondary | ICD-10-CM | POA: Diagnosis not present

## 2021-11-04 DIAGNOSIS — E785 Hyperlipidemia, unspecified: Secondary | ICD-10-CM | POA: Diagnosis not present

## 2021-11-04 DIAGNOSIS — I110 Hypertensive heart disease with heart failure: Secondary | ICD-10-CM | POA: Diagnosis not present

## 2021-11-07 DIAGNOSIS — H65111 Acute and subacute allergic otitis media (mucoid) (sanguinous) (serous), right ear: Secondary | ICD-10-CM | POA: Diagnosis not present

## 2021-11-07 DIAGNOSIS — R0609 Other forms of dyspnea: Secondary | ICD-10-CM | POA: Diagnosis not present

## 2021-11-07 DIAGNOSIS — J4530 Mild persistent asthma, uncomplicated: Secondary | ICD-10-CM | POA: Diagnosis not present

## 2021-11-07 DIAGNOSIS — G4733 Obstructive sleep apnea (adult) (pediatric): Secondary | ICD-10-CM | POA: Diagnosis not present

## 2022-03-27 ENCOUNTER — Ambulatory Visit: Payer: BC Managed Care – PPO | Admitting: Nurse Practitioner

## 2022-04-02 NOTE — Patient Instructions (Signed)

## 2022-04-05 ENCOUNTER — Encounter: Payer: Self-pay | Admitting: Nurse Practitioner

## 2022-04-05 ENCOUNTER — Ambulatory Visit (INDEPENDENT_AMBULATORY_CARE_PROVIDER_SITE_OTHER): Payer: Medicare Other | Admitting: Nurse Practitioner

## 2022-04-05 VITALS — BP 127/71 | HR 60 | Temp 98.3°F | Wt 229.6 lb

## 2022-04-05 DIAGNOSIS — I110 Hypertensive heart disease with heart failure: Secondary | ICD-10-CM

## 2022-04-05 DIAGNOSIS — G4733 Obstructive sleep apnea (adult) (pediatric): Secondary | ICD-10-CM

## 2022-04-05 DIAGNOSIS — Z95810 Presence of automatic (implantable) cardiac defibrillator: Secondary | ICD-10-CM

## 2022-04-05 DIAGNOSIS — D696 Thrombocytopenia, unspecified: Secondary | ICD-10-CM | POA: Diagnosis not present

## 2022-04-05 DIAGNOSIS — E0590 Thyrotoxicosis, unspecified without thyrotoxic crisis or storm: Secondary | ICD-10-CM

## 2022-04-05 DIAGNOSIS — E782 Mixed hyperlipidemia: Secondary | ICD-10-CM

## 2022-04-05 DIAGNOSIS — R413 Other amnesia: Secondary | ICD-10-CM

## 2022-04-05 DIAGNOSIS — L237 Allergic contact dermatitis due to plants, except food: Secondary | ICD-10-CM

## 2022-04-05 DIAGNOSIS — I5042 Chronic combined systolic (congestive) and diastolic (congestive) heart failure: Secondary | ICD-10-CM

## 2022-04-05 DIAGNOSIS — J4550 Severe persistent asthma, uncomplicated: Secondary | ICD-10-CM

## 2022-04-05 DIAGNOSIS — J709 Respiratory conditions due to unspecified external agent: Secondary | ICD-10-CM | POA: Diagnosis not present

## 2022-04-05 DIAGNOSIS — Z6831 Body mass index (BMI) 31.0-31.9, adult: Secondary | ICD-10-CM

## 2022-04-05 DIAGNOSIS — E6609 Other obesity due to excess calories: Secondary | ICD-10-CM

## 2022-04-05 MED ORDER — PREDNISONE 20 MG PO TABS: 40.0000 mg | ORAL_TABLET | Freq: Every day | ORAL | 0 refills | Status: AC

## 2022-04-05 MED ORDER — TRIAMCINOLONE ACETONIDE 0.1 % EX CREA: 1.0000 | TOPICAL_CREAM | Freq: Two times a day (BID) | CUTANEOUS | 0 refills | Status: DC

## 2022-04-05 NOTE — Assessment & Plan Note (Signed)
Chronic, ongoing with BP at goal in office today.  Continue current medication regimen and collaboration with cardiology.  Recommend he monitor BP at least a few mornings a week at home and document.  DASH diet at home.  Notes reviewed.  LAB:  CBC, CMP, TSH.

## 2022-04-05 NOTE — Assessment & Plan Note (Signed)
Noted in past on labs, recheck CBC today and monitor closely.

## 2022-04-05 NOTE — Assessment & Plan Note (Signed)
Ongoing and stable at this time. Euvolemic today.  Reviewed recent cardiology note.  Continue current medication regimen and collaboration with cardiology.  Recommend: - Reminded to call for an overnight weight gain of >2 pounds or a weekly weight weight of >5 pounds - not adding salt to his food and has been reading food labels. Reviewed the importance of keeping daily sodium intake to '2000mg'$  daily  - Avoid Ibuprofen products

## 2022-04-05 NOTE — Assessment & Plan Note (Signed)
No longer uses CPAP -- has not used in 2 years since weight loss -- with memory changes may benefit from retesting of OSA.

## 2022-04-05 NOTE — Assessment & Plan Note (Signed)
Continue collaboration with cardiology team at [facility].

## 2022-04-05 NOTE — Assessment & Plan Note (Signed)
Chronic, ongoing.  Continue Crestor and supplements daily.  Obtain lipid panel today.

## 2022-04-05 NOTE — Assessment & Plan Note (Signed)
Recent labs with waxing and waning of thyroid while on Amiodarone -- hypo to hyper levels.  Is off Amiodarone now, recheck labs today.  Initiate medication as needed.

## 2022-04-05 NOTE — Assessment & Plan Note (Signed)
BMI 33.85, has lost significant weight over past few years = total of 150 lbs.  Continue keto diet and collaboration with Dr. Leeanne Rio.

## 2022-04-05 NOTE — Assessment & Plan Note (Signed)
Chronic, ongoing,  Followed by pulmonary and allergist.  Continue this collaboration and current medication regimen as prescribed by pulmonary.  Obtain CBC today.

## 2022-04-05 NOTE — Assessment & Plan Note (Signed)
MMSE 29/30 today, concerns reported by wife to be worsening over past 3 years.  He does have short term recall memory changes noted on exam and testing.  Will obtain CT imaging (no MRI, has pacemaker) and labs today: RPR, B12, CBC, TSH.  Medication review done, do not suspect related to any current medications.  Referral to neurology placed.

## 2022-04-05 NOTE — Assessment & Plan Note (Signed)
Chronic, ongoing.  Continue current medication regimen and collaboration with pulmonary.  Recent noted reviewed.

## 2022-04-05 NOTE — Assessment & Plan Note (Signed)
Acute to left hand, right forearm, and left temple.  Will send in 5 days of Prednisone 40 MG daily (recently completed Prednisone for asthma) and Triamcinolone cream.  Recommend he avoid contact with areas and if contact wash hands well after to prevent further spread.  If any worsening on ongoing symptoms return to office.

## 2022-04-05 NOTE — Progress Notes (Signed)
BP 127/71   Pulse 60   Temp 98.3 F (36.8 C) (Oral)   Wt 229 lb 9.6 oz (104.1 kg)   SpO2 93%   BMI 33.85 kg/m    Subjective:    Patient ID: Thomas Calhoun, male    DOB: 1955-08-25, 66 y.o.   MRN: [id number]  HPI: Thomas Calhoun is a 66 y.o. male  Chief Complaint  Patient presents with   Hyperlipidemia   Hypertension   Congestive Heart Failure   Sleep Apnea   Asthma   Cognitive Issues    Patient wife says she is concerned about some cognitive issues with the patient. Patient says it has been an ongoing issue for the past 3 years, but she has noticed it has gotten worse. Patient wife says she mentioned it to the Cardiologist and says someone told the patient to take B-12 and that is not helping.    Poison Oak   Wife at bedside.  HYPERTENSION / [place] cardiology visit 10/25/21 -- they took him off Amiodarone.  ICD in place, May 2020, last check 12/07/21.  History of IFG with recent A1c 5%.  Has not used CPAP in over 2 years since losing weight.  Continues Metoprolol, Losartan + Crestor. Has lost > 150 pounds total, continues to work with Dr. Enrigue Catena. Satisfied with current treatment? yes  Duration of hypertension: chronic  BP monitoring frequency: not checking BP range:  BP medication side effects: no  Duration of hyperlipidemia: chronic   Cholesterol supplements: none  Aspirin: no  Recent stressors: no  Recurrent headaches: no  Visual changes: no  Palpitations: no  Dyspnea: no  Chest pain: no  Lower extremity edema: no  Dizzy/lightheaded: no  The 10-year ASCVD risk score (Arnett DK, et al., 2019) is: 13.3%   Values used to calculate the score:     Age: 39 years     Sex: Male     Is Non-Hispanic African American: No     Diabetic: No     Tobacco smoker: No     Systolic Blood Pressure: 024 mmHg     Is BP treated: Yes     HDL Cholesterol: 68 mg/dL     Total Cholesterol: 226 mg/dL  THYROID ISSUES & MEMORY Was on Amiodarone and  recent thyroid labs had been fluctuating.   Wife has noticed memory changes for past 3 years -- she feels this is becoming progressively worse, 15 to 20 minutes after being told something, will forget what he was told.  Short term memory affected more.  She would like a referral to neurologist for this.  Has left kitchen sink water running and outside spigot for 7 days.  Etiology of hypothyroidism: was on Amiodarone Fatigue: no Cold intolerance: no Heat intolerance: no Weight gain: no Weight loss: no Constipation: no Diarrhea/loose stools: no Palpitations: no Lower extremity edema: no Anxiety/depressed mood: no     04/05/2022    9:51 AM  MMSE - Mini Mental State Exam  Orientation to time 5  Orientation to Place 5  Registration 3  Attention/ Calculation 5  Recall 2  Language- name 2 objects 2  Language- repeat 1  Language- follow 3 step command 3  Language- read & follow direction 1  Write a sentence 1  Copy design 1  Total score 29    ASTHMA  Last visit with Dr. Raul Del on 11/07/21. Continues on Nucala, Advair, Albuterol, Claritin, Singulair, Spiriva.  He is also followed  by ENT and Allergist at [facility] -- currently on abx for ear infection. Asthma status: stable  Satisfied with current treatment?: yes  Albuterol/rescue inhaler frequency:  Dyspnea frequency: none  Wheezing frequency: occasional  Cough frequency: none  Nocturnal symptom frequency:  Limitation of activity: no  Current upper respiratory symptoms: no  Triggers: seasonal  Home peak flows: none  Last Spirometry: with pulmonary  Failed/intolerant to following asthma meds:  Asthma meds in past: unknown  Aerochamber/spacer use: no  Visits to ER or Urgent Care in past year: no  Pneumovax: Not up to Date  Influenza: Up to Date   [facility] from cutting bushes Saturday -- present to left hand and right arm. Duration:  days  Location: face and arms  Itching: yes Burning: no Redness: yes Oozing:  yes Scaling: no Blisters: yes Painful: no Fevers: no Change in detergents/soaps/personal care products: no Recent illness: no Recent travel:no History of same: yes Context: fluctuating Alleviating factors: lotion/moisturizer Treatments attempted:lotion/moisturizer Shortness of breath: no  Throat/tongue swelling: no Myalgias/arthralgias: no      04/05/2022    9:18 AM 10/06/2021   11:06 AM 07/20/2020   11:05 AM 06/30/2019   11:16 AM 01/04/2018    9:02 AM  Depression screen PHQ 2/9  Decreased Interest 0 0 0 0 0  Down, Depressed, Hopeless 0 0 0 0 0  PHQ - 2 Score 0 0 0 0 0  Altered sleeping 0 0 0 0   Tired, decreased energy 0 0 0 0   Change in appetite 0 0 0 0   Feeling bad or failure about yourself  0 0 0 0   Trouble concentrating 0 0 0 0   Moving slowly or fidgety/restless 0 0 0 0   Suicidal thoughts 0 0 0 0   PHQ-9 Score 0 0 0 0   Difficult doing work/chores Not difficult at all Not difficult at all Not difficult at all       Relevant past medical, surgical, family and social history reviewed and updated as indicated. Interim medical history since our last visit reviewed. Allergies and medications reviewed and updated.  Review of Systems  Constitutional:  Negative for activity change, diaphoresis, fatigue and fever.  Respiratory:  Negative for cough, chest tightness, shortness of breath and wheezing.   Cardiovascular:  Negative for chest pain, palpitations and leg swelling.  Gastrointestinal: Negative.   Neurological: Negative.   Psychiatric/Behavioral: Negative.      Per HPI unless specifically indicated above     Objective:    BP 127/71   Pulse 60   Temp 98.3 F (36.8 C) (Oral)   Wt 229 lb 9.6 oz (104.1 kg)   SpO2 93%   BMI 33.85 kg/m   Wt Readings from Last 3 Encounters:  04/05/22 229 lb 9.6 oz (104.1 kg)  10/06/21 213 lb 3.2 oz (96.7 kg)  03/31/21 211 lb 6.4 oz (95.9 kg)    Physical Exam Vitals and nursing note reviewed.  Constitutional:       General: He is awake. He is not in acute distress.    Appearance: He is well-developed and well-groomed. He is obese. He is not ill-appearing or toxic-appearing.  HENT:     Head: Normocephalic and atraumatic.     Right Ear: Hearing, tympanic membrane, ear canal and external ear normal. No drainage.     Left Ear: Hearing, tympanic membrane, ear canal and external ear normal. No drainage.  Eyes:     General: Lids are normal.  Right eye: No discharge.        Left eye: No discharge.     Conjunctiva/sclera: Conjunctivae normal.     Pupils: Pupils are equal, round, and reactive to light.  Neck:     Thyroid: No thyromegaly.     Vascular: No carotid bruit.     Trachea: Trachea normal.  Cardiovascular:     Rate and Rhythm: Normal rate and regular rhythm.     Heart sounds: Normal heart sounds, S1 normal and S2 normal. No murmur heard.    No gallop.  Pulmonary:     Effort: Pulmonary effort is normal. No accessory muscle usage or respiratory distress.     Breath sounds: Normal breath sounds.  Abdominal:     General: Bowel sounds are normal. There is no distension.     Palpations: Abdomen is soft.  Musculoskeletal:        General: Normal range of motion.     Cervical back: Normal range of motion and neck supple.     Right lower leg: No edema.     Left lower leg: No edema.  Lymphadenopathy:     Cervical: No cervical adenopathy.  Skin:    General: Skin is warm and dry.     Capillary Refill: Capillary refill takes less than 2 seconds.     Findings: Rash present.     Comments: Areas of linear rash with blisters to left hand (edema present here), right forearm, and left temple.  Clear fluid draining from blisters.  Neurological:     Mental Status: He is alert and oriented to person, place, and time.     Cranial Nerves: Cranial nerves 2-12 are intact.     Deep Tendon Reflexes: Reflexes are normal and symmetric.     Reflex Scores:      Brachioradialis reflexes are 2+ on the right side  and 2+ on the left side.      Patellar reflexes are 2+ on the right side and 2+ on the left side.    Comments: Some short term recall memory changes noted.  Psychiatric:        Attention and Perception: Attention normal.        Mood and Affect: Mood normal.        Speech: Speech normal.        Behavior: Behavior normal. Behavior is cooperative.        Thought Content: Thought content normal.        Cognition and Memory: He exhibits impaired recent memory (some short term recall memory changes).    Results for orders placed or performed in visit on 10/06/21  Bayer DCA Hb A1c Waived  Result Value Ref Range   HB A1C (BAYER DCA - WAIVED) 5.0 4.8 - 5.6 %  Microalbumin, Urine Waived  Result Value Ref Range   Microalb, Ur Waived 10 0 - 19 mg/L   Creatinine, Urine Waived 200 10 - 300 mg/dL   Microalb/Creat Ratio <30 <30 mg/g  Comprehensive metabolic panel  Result Value Ref Range   Glucose 96 70 - 99 mg/dL   BUN 16 8 - 27 mg/dL   Creatinine, Ser 1.01 0.76 - 1.27 mg/dL   eGFR 83 >59 mL/min/1.73   BUN/Creatinine Ratio 16 10 - 24   Sodium 141 134 - 144 mmol/L   Potassium 4.1 3.5 - 5.2 mmol/L   Chloride 104 96 - 106 mmol/L   CO2 24 20 - 29 mmol/L   Calcium 9.3 8.6 - 10.2 mg/dL  Total Protein 6.7 6.0 - 8.5 g/dL   Albumin 4.6 3.8 - 4.8 g/dL   Globulin, Total 2.1 1.5 - 4.5 g/dL   Albumin/Globulin Ratio 2.2 1.2 - 2.2   Bilirubin Total 1.0 0.0 - 1.2 mg/dL   Alkaline Phosphatase 65 44 - 121 IU/L   AST 26 0 - 40 IU/L   ALT 27 0 - 44 IU/L  CBC with Differential/Platelet  Result Value Ref Range   WBC 5.2 3.4 - 10.8 x10E3/uL   RBC 4.79 4.14 - 5.80 x10E6/uL   Hemoglobin 16.2 13.0 - 17.7 g/dL   Hematocrit 47.5 37.5 - 51.0 %   MCV 99 (H) 79 - 97 fL   MCH 33.8 (H) 26.6 - 33.0 pg   MCHC 34.1 31.5 - 35.7 g/dL   RDW 12.9 11.6 - 15.4 %   Platelets 97 (LL) 150 - 450 x10E3/uL   Neutrophils 65 Not Estab. %   Lymphs 18 Not Estab. %   Monocytes 13 Not Estab. %   Eos 3 Not Estab. %   Basos 1 Not  Estab. %   Neutrophils Absolute 3.4 1.4 - 7.0 x10E3/uL   Lymphocytes Absolute 0.9 0.7 - 3.1 x10E3/uL   Monocytes Absolute 0.7 0.1 - 0.9 x10E3/uL   EOS (ABSOLUTE) 0.2 0.0 - 0.4 x10E3/uL   Basophils Absolute 0.1 0.0 - 0.2 x10E3/uL   Immature Granulocytes 0 Not Estab. %   Immature Grans (Abs) 0.0 0.0 - 0.1 x10E3/uL   Hematology Comments: Note:   Lipid Panel w/o Chol/HDL Ratio  Result Value Ref Range   Cholesterol, Total 204 (H) 100 - 199 mg/dL   Triglycerides 66 0 - 149 mg/dL   HDL 74 >39 mg/dL   VLDL Cholesterol Cal 12 5 - 40 mg/dL   LDL Chol Calc (NIH) 118 (H) 0 - 99 mg/dL  TSH  Result Value Ref Range   TSH 8.050 (H) 0.450 - 4.500 uIU/mL  T4, free  Result Value Ref Range   Free T4 1.21 0.82 - 1.77 ng/dL  Hepatitis C antibody  Result Value Ref Range   Hep C Virus Ab Non Reactive Non Reactive  HIV Antibody (routine testing w rflx)  Result Value Ref Range   HIV Screen 4th Generation wRfx Non Reactive Non Reactive      Assessment & Plan:   Problem List Items Addressed This Visit       Cardiovascular and Mediastinum   Chronic combined systolic and diastolic CHF (congestive heart failure) (HCC) - Primary    Ongoing and stable at this time. Euvolemic today.  Reviewed recent cardiology note.  Continue current medication regimen and collaboration with cardiology.  Recommend: - Reminded to call for an overnight weight gain of >2 pounds or a weekly weight weight of >5 pounds - not adding salt to his food and has been reading food labels. Reviewed the importance of keeping daily sodium intake to <204m daily  - Avoid Ibuprofen products      Relevant Orders   CBC with Differential/Platelet   Comprehensive metabolic panel   Lipid Panel w/o Chol/HDL Ratio   Hypertensive heart disease with HF (heart failure) (HCC)    Chronic, ongoing with BP at goal in office today.  Continue current medication regimen and collaboration with cardiology.  Recommend he monitor BP at least a few mornings  a week at home and document.  DASH diet at home.  Notes reviewed.  LAB:  CBC, CMP, TSH.        Relevant Orders   Comprehensive  metabolic panel   Lipid Panel w/o Chol/HDL Ratio     Respiratory   Extrinsic allergic respiratory disease (HCC)    Chronic, ongoing,  Followed by pulmonary and allergist.  Continue this collaboration and current medication regimen as prescribed by pulmonary.  Obtain CBC today.      Relevant Orders   CBC with Differential/Platelet   Obstructive sleep apnea    No longer uses CPAP -- has not used in 2 years since weight loss -- with memory changes may benefit from retesting of OSA.      Relevant Orders   CBC with Differential/Platelet   Severe asthma    Chronic, ongoing.  Continue current medication regimen and collaboration with pulmonary.  Recent noted reviewed.        Relevant Medications   predniSONE (DELTASONE) 20 MG tablet   Other Relevant Orders   CBC with Differential/Platelet     Endocrine   Hyperthyroidism    Recent labs with waxing and waning of thyroid while on Amiodarone -- hypo to hyper levels.  Is off Amiodarone now, recheck labs today.  Initiate medication as needed.      Relevant Orders   T4, free   Thyroid peroxidase antibody   TSH     Musculoskeletal and Integument   Poison oak dermatitis    Acute to left hand, right forearm, and left temple.  Will send in 5 days of Prednisone 40 MG daily (recently completed Prednisone for asthma) and Triamcinolone cream.  Recommend he avoid contact with areas and if contact wash hands well after to prevent further spread.  If any worsening on ongoing symptoms return to office.        Hematopoietic and Hemostatic   Thrombocytopenia ([place])    Noted in past on labs, recheck CBC today and monitor closely.      Relevant Orders   CBC with Differential/Platelet     Other   Hyperlipidemia    Chronic, ongoing.  Continue Crestor and supplements daily.  Obtain lipid panel today.      Relevant  Orders   Comprehensive metabolic panel   Lipid Panel w/o Chol/HDL Ratio   ICD (implantable cardioverter-defibrillator) in place    Continue collaboration with cardiology team at [facility].      Memory changes    MMSE 29/30 today, concerns reported by wife to be worsening over past 3 years.  He does have short term recall memory changes noted on exam and testing.  Will obtain CT imaging (no MRI, has pacemaker) and labs today: RPR, B12, CBC, TSH.  Medication review done, do not suspect related to any current medications.  Referral to neurology placed.      Relevant Orders   RPR   Vitamin B12   CT HEAD WO CONTRAST (5MM)   Ambulatory referral to Neurology   Obesity    BMI 33.85, has lost significant weight over past few years = total of 150 lbs.  Continue keto diet and collaboration with Dr. Leeanne Rio.        Follow up plan: Return in about 8 weeks (around 05/31/2022) for Memory changes.

## 2022-04-06 LAB — COMPREHENSIVE METABOLIC PANEL
ALT: 23 IU/L (ref 0–44)
AST: 22 IU/L (ref 0–40)
Albumin/Globulin Ratio: 2.3 — ABNORMAL HIGH (ref 1.2–2.2)
Albumin: 4.3 g/dL (ref 3.9–4.9)
Alkaline Phosphatase: 47 IU/L (ref 44–121)
BUN/Creatinine Ratio: 26 — ABNORMAL HIGH (ref 10–24)
BUN: 24 mg/dL (ref 8–27)
Bilirubin Total: 1 mg/dL (ref 0.0–1.2)
CO2: 23 mmol/L (ref 20–29)
Calcium: 9.1 mg/dL (ref 8.6–10.2)
Chloride: 105 mmol/L (ref 96–106)
Creatinine, Ser: 0.92 mg/dL (ref 0.76–1.27)
Globulin, Total: 1.9 g/dL (ref 1.5–4.5)
Glucose: 89 mg/dL (ref 70–99)
Potassium: 4 mmol/L (ref 3.5–5.2)
Sodium: 141 mmol/L (ref 134–144)
Total Protein: 6.2 g/dL (ref 6.0–8.5)
eGFR: 92 mL/min/{1.73_m2} (ref >59)

## 2022-04-06 LAB — CBC WITH DIFFERENTIAL/PLATELET
Basophils Absolute: 0 10*3/uL (ref 0.0–0.2)
Basos: 0 %
EOS (ABSOLUTE): 0.1 10*3/uL (ref 0.0–0.4)
Eos: 2 %
Hematocrit: 44.2 % (ref 37.5–51.0)
Hemoglobin: 15.3 g/dL (ref 13.0–17.7)
Immature Grans (Abs): 0 10*3/uL (ref 0.0–0.1)
Immature Granulocytes: 0 %
Lymphocytes Absolute: 1.3 10*3/uL (ref 0.7–3.1)
Lymphs: 27 %
MCH: 34.6 pg — ABNORMAL HIGH (ref 26.6–33.0)
MCHC: 34.6 g/dL (ref 31.5–35.7)
MCV: 100 fL — ABNORMAL HIGH (ref 79–97)
Monocytes Absolute: 0.5 10*3/uL (ref 0.1–0.9)
Monocytes: 11 %
Neutrophils Absolute: 2.9 10*3/uL (ref 1.4–7.0)
Neutrophils: 60 %
Platelets: 93 10*3/uL — CL (ref 150–450)
RBC: 4.42 x10E6/uL (ref 4.14–5.80)
RDW: 12.3 % (ref 11.6–15.4)
WBC: 4.9 10*3/uL (ref 3.4–10.8)

## 2022-04-06 LAB — LIPID PANEL W/O CHOL/HDL RATIO
Cholesterol, Total: 178 mg/dL (ref 100–199)
HDL: 74 mg/dL (ref >39)
LDL Chol Calc (NIH): 93 mg/dL (ref 0–99)
Triglycerides: 55 mg/dL (ref 0–149)
VLDL Cholesterol Cal: 11 mg/dL (ref 5–40)

## 2022-04-06 LAB — RPR: RPR Ser Ql: NONREACTIVE

## 2022-04-06 LAB — THYROID PEROXIDASE ANTIBODY: Thyroperoxidase Ab SerPl-aCnc: <9 IU/mL (ref 0–34)

## 2022-04-06 LAB — VITAMIN B12: Vitamin B-12: 790 pg/mL (ref 232–1245)

## 2022-04-06 LAB — T4, FREE: Free T4: 1.26 ng/dL (ref 0.82–1.77)

## 2022-04-06 LAB — TSH: TSH: 3.78 u[IU]/mL (ref 0.450–4.500)

## 2022-04-07 ENCOUNTER — Other Ambulatory Visit: Payer: Self-pay | Admitting: Nurse Practitioner

## 2022-04-07 DIAGNOSIS — D696 Thrombocytopenia, unspecified: Secondary | ICD-10-CM

## 2022-04-07 NOTE — Progress Notes (Signed)
Contacted via [place] afternoon Nicole Kindred, your labs have returned: - CBC is showing some further lowering in platelet count, you have been on lower side since 12/27/2018 however this has trended down.  I would like to recheck this in 4 weeks to see if ongoing downward trend.  Outpatient lab visit only. - Kidney function, creatinine and eGFR, remains normal, as is liver function, AST and ALT.   - Thyroid labs normal now you are off Amiodarone!!  Woohoo!! - Cholesterol labs stable, continue medication. - Remainder of labs stable.  Have you schedule with neurology? Keep being amazing!!  Thank you for allowing me to participate in your care.  I appreciate you. Kindest regards, [place] Talcott

## 2022-04-08 ENCOUNTER — Encounter: Payer: Self-pay | Admitting: Nurse Practitioner

## 2022-04-12 ENCOUNTER — Telehealth: Payer: Self-pay

## 2022-04-12 NOTE — Telephone Encounter (Signed)
Spoke with patient and provided patient with contact information in regards to follow up with the status of his referral to Neurology for Dr.Shah's office. Patient was provided with [phone]. Patient verbalized understanding.

## 2022-04-12 NOTE — Telephone Encounter (Signed)
Copied from [place] [phone]. Topic: General - Other >> Apr 12, 2022 10:51 AM Leitha Schuller wrote: Reason for CRM: Pts spouse checking status of neurology referral  I was unable to locate a DPR  Please assist further

## 2022-04-13 ENCOUNTER — Ambulatory Visit
Admission: RE | Admit: 2022-04-13 | Discharge: 2022-04-13 | Disposition: A | Discharge status: Home / Self Care | Payer: Medicare Other | Source: Ambulatory Visit | Attending: Nurse Practitioner | Admitting: Nurse Practitioner

## 2022-04-13 DIAGNOSIS — R413 Other amnesia: Secondary | ICD-10-CM | POA: Insufficient documentation

## 2022-04-14 NOTE — Progress Notes (Signed)
Contacted via [place] afternoon Nicole Kindred, your imaging has returned and the good news is there is no acute finding, like stroke.  There is also no atrophy (less muscle mass) noted -- which we worry about with dementia.  There is one mild small vessel changes which can be common finding in my patient as they age and have chronic health issues.  The sinus area, left mastoid, does show mild stiffening and there is fluid in the middle left ear.  Any questions? Keep being wonderful!!  Thank you for allowing me to participate in your care.  I appreciate you. Kindest regards, Ericia Moxley

## 2022-05-04 ENCOUNTER — Other Ambulatory Visit: Payer: Medicare Other

## 2022-05-04 DIAGNOSIS — E0590 Thyrotoxicosis, unspecified without thyrotoxic crisis or storm: Secondary | ICD-10-CM

## 2022-05-04 DIAGNOSIS — D696 Thrombocytopenia, unspecified: Secondary | ICD-10-CM

## 2022-05-05 ENCOUNTER — Encounter: Payer: Self-pay | Admitting: Nurse Practitioner

## 2022-05-05 NOTE — Progress Notes (Signed)
Contacted via [place] afternoon Thomas Calhoun, some of your labs have returned.  CBC only so far.  Thyroid labs not back yet.  Platelet count still low, but has come up some into 100 range. Good news.  Waiting on those thyroid labs and will let you know when return.

## 2022-05-06 LAB — CBC WITH DIFFERENTIAL/PLATELET
Basophils Absolute: 0 10*3/uL (ref 0.0–0.2)
Basos: 1 %
EOS (ABSOLUTE): 0.1 10*3/uL (ref 0.0–0.4)
Eos: 2 %
Hematocrit: 46.3 % (ref 37.5–51.0)
Hemoglobin: 15.9 g/dL (ref 13.0–17.7)
Immature Grans (Abs): 0 10*3/uL (ref 0.0–0.1)
Immature Granulocytes: 0 %
Lymphocytes Absolute: 1.2 10*3/uL (ref 0.7–3.1)
Lymphs: 22 %
MCH: 34.6 pg — ABNORMAL HIGH (ref 26.6–33.0)
MCHC: 34.3 g/dL (ref 31.5–35.7)
MCV: 101 fL — ABNORMAL HIGH (ref 79–97)
Monocytes Absolute: 0.5 10*3/uL (ref 0.1–0.9)
Monocytes: 9 %
Neutrophils Absolute: 3.6 10*3/uL (ref 1.4–7.0)
Neutrophils: 66 %
Platelets: 105 10*3/uL — ABNORMAL LOW (ref 150–450)
RBC: 4.59 x10E6/uL (ref 4.14–5.80)
RDW: 13.1 % (ref 11.6–15.4)
WBC: 5.4 10*3/uL (ref 3.4–10.8)

## 2022-05-06 LAB — T4, FREE: Free T4: 1.24 ng/dL (ref 0.82–1.77)

## 2022-05-06 LAB — TSH: TSH: 3.43 u[IU]/mL (ref 0.450–4.500)

## 2022-05-06 NOTE — Progress Notes (Signed)
Contacted via MyChart   Thyroid labs remain nice and normal on this check:) [employer]!!

## 2022-05-28 NOTE — Patient Instructions (Signed)

## 2022-05-31 ENCOUNTER — Ambulatory Visit (INDEPENDENT_AMBULATORY_CARE_PROVIDER_SITE_OTHER): Payer: Medicare Other | Admitting: Nurse Practitioner

## 2022-05-31 ENCOUNTER — Encounter: Payer: Self-pay | Admitting: Nurse Practitioner

## 2022-05-31 DIAGNOSIS — R413 Other amnesia: Secondary | ICD-10-CM | POA: Diagnosis not present

## 2022-05-31 NOTE — Assessment & Plan Note (Signed)
MMSE 30/30 today and 29/30 last visit, concerns reported by wife to be worsening over past 3 years.  Overall memory stable today, ?some long term underlying ADD as he reports he has always been unable to recall what people say at times as mind is constantly thinking about multiple things.  Recent imaging and labs reassuring. Medication review done, do not suspect related to any current medications.  Scheduled to see neurology in November 2023, appreciate their recommendations and input.

## 2022-05-31 NOTE — Progress Notes (Signed)
BP 115/75   Pulse 60   Temp 97.9 F (36.6 C) (Oral)   Wt 233 lb 9.6 oz (106 kg)   SpO2 91%   BMI 34.44 kg/m    Subjective:    Patient ID: Thomas Calhoun, male    DOB: 07/21/1956, 66 y.o.   MRN: [id number]  HPI: Thomas Calhoun is a 67 y.o. male  Chief Complaint  Patient presents with   Memory Changes    Patient is here for eight week follow up for Memory Changes.    MEMORY CHANGES: Here today for follow-up on memory changes.  Wife concerned per patient as she noticed memory changes for past 3 years -- she feels this is becoming progressively worse, 15 to 20 minutes after being told something, will forget what he was told and ask what was told.  Short term memory affected more. Has left kitchen sink water running and outside spigot for 7 days. Recent imaging, CT, did note minimal white matter hypodensity (small vessel ischemic changes) + mild sclerosis in left mastoid.  Recent labs thyroid level stable -- overall reassuring.  Has referral to neurology -- scheduled 07/10/22.  No family history of memory changes or dementia.    He reports since last visit has been doing well with no major changes in memory.  Able to get from point A to B without forgetting steps while driving.  No recent events at home.  He reports he has been this way for 20 years, forgetting what people say at times.  He hears them, but not always "hears" them as he is constantly thinking about lots of different things.    05/31/2022    9:42 AM 04/05/2022    9:51 AM  MMSE - Mini Mental State Exam  Orientation to time 5 5  Orientation to Place 5 5  Registration 3 3  Attention/ Calculation 5 5  Recall 3 2  Language- name 2 objects 2 2  Language- repeat 1 1  Language- follow 3 step command 3 3  Language- read & follow direction 1 1  Write a sentence 1 1  Copy design 1 1  Total score 30 29    Relevant past medical, surgical, family and social history reviewed and updated as indicated. Interim medical  history since our last visit reviewed. Allergies and medications reviewed and updated.  Review of Systems  Constitutional:  Negative for activity change, diaphoresis, fatigue and fever.  Respiratory:  Negative for cough, chest tightness, shortness of breath and wheezing.   Cardiovascular:  Negative for chest pain, palpitations and leg swelling.  Gastrointestinal: Negative.   Neurological: Negative.   Psychiatric/Behavioral: Negative.     Per HPI unless specifically indicated above     Objective:    BP 115/75   Pulse 60   Temp 97.9 F (36.6 C) (Oral)   Wt 233 lb 9.6 oz (106 kg)   SpO2 91%   BMI 34.44 kg/m   Wt Readings from Last 3 Encounters:  05/31/22 233 lb 9.6 oz (106 kg)  04/05/22 229 lb 9.6 oz (104.1 kg)  10/06/21 213 lb 3.2 oz (96.7 kg)    Physical Exam Vitals and nursing note reviewed.  Constitutional:      General: He is awake. He is not in acute distress.    Appearance: He is well-developed and well-groomed. He is obese. He is not ill-appearing.  HENT:     Head: Normocephalic and atraumatic.     Right Ear: Hearing, tympanic membrane, ear  canal and external ear normal. No drainage.     Left Ear: Hearing, tympanic membrane, ear canal and external ear normal. No drainage.  Eyes:     General: Lids are normal.        Right eye: No discharge.        Left eye: No discharge.     Conjunctiva/sclera: Conjunctivae normal.     Pupils: Pupils are equal, round, and reactive to light.  Neck:     Thyroid: No thyromegaly.     Vascular: No carotid bruit.     Trachea: Trachea normal.  Cardiovascular:     Rate and Rhythm: Normal rate and regular rhythm.     Heart sounds: Normal heart sounds, S1 normal and S2 normal. No murmur heard.    No gallop.  Pulmonary:     Effort: Pulmonary effort is normal. No accessory muscle usage or respiratory distress.     Breath sounds: Normal breath sounds.  Abdominal:     General: Bowel sounds are normal.     Palpations: Abdomen is soft.   Musculoskeletal:        General: Normal range of motion.     Cervical back: Normal range of motion and neck supple.     Right lower leg: No edema.     Left lower leg: No edema.  Skin:    General: Skin is warm and dry.     Capillary Refill: Capillary refill takes less than 2 seconds.  Neurological:     Mental Status: He is alert and oriented to person, place, and time.     Cranial Nerves: Cranial nerves 2-12 are intact.     Motor: Motor function is intact.     Coordination: Coordination is intact.     Gait: Gait is intact.     Deep Tendon Reflexes: Reflexes are normal and symmetric.     Reflex Scores:      Brachioradialis reflexes are 2+ on the right side and 2+ on the left side.      Patellar reflexes are 2+ on the right side and 2+ on the left side.    Comments: Upon leaving today was still able to recall three words provided during MMSE.  Psychiatric:        Attention and Perception: Attention normal.        Mood and Affect: Mood normal.        Speech: Speech normal.        Behavior: Behavior normal. Behavior is cooperative.        Thought Content: Thought content normal.    Results for orders placed or performed in visit on 05/04/22  CBC with Differential/Platelet  Result Value Ref Range   WBC 5.4 3.4 - 10.8 x10E3/uL   RBC 4.59 4.14 - 5.80 x10E6/uL   Hemoglobin 15.9 13.0 - 17.7 g/dL   Hematocrit 46.3 37.5 - 51.0 %   MCV 101 (H) 79 - 97 fL   MCH 34.6 (H) 26.6 - 33.0 pg   MCHC 34.3 31.5 - 35.7 g/dL   RDW 13.1 11.6 - 15.4 %   Platelets 105 (L) 150 - 450 x10E3/uL   Neutrophils 66 Not Estab. %   Lymphs 22 Not Estab. %   Monocytes 9 Not Estab. %   Eos 2 Not Estab. %   Basos 1 Not Estab. %   Neutrophils Absolute 3.6 1.4 - 7.0 x10E3/uL   Lymphocytes Absolute 1.2 0.7 - 3.1 x10E3/uL   Monocytes Absolute 0.5 0.1 - 0.9 x10E3/uL  EOS (ABSOLUTE) 0.1 0.0 - 0.4 x10E3/uL   Basophils Absolute 0.0 0.0 - 0.2 x10E3/uL   Immature Granulocytes 0 Not Estab. %   Immature Grans (Abs)  0.0 0.0 - 0.1 x10E3/uL  T4, free  Result Value Ref Range   Free T4 1.24 0.82 - 1.77 ng/dL  TSH  Result Value Ref Range   TSH 3.430 0.450 - 4.500 uIU/mL      Assessment & Plan:   Problem List Items Addressed This Visit       Other   Memory changes    MMSE 30/30 today and 29/30 last visit, concerns reported by wife to be worsening over past 3 years.  Overall memory stable today, ?some long term underlying ADD as he reports he has always been unable to recall what people say at times as mind is constantly thinking about multiple things.  Recent imaging and labs reassuring. Medication review done, do not suspect related to any current medications.  Scheduled to see neurology in November 2023, appreciate their recommendations and input.        Follow up plan: Return in about 5 months (around 10/30/2022) for HTN/HLD, CHF, ASTHMA, OSA, GERD, IFG.

## 2022-06-27 ENCOUNTER — Encounter: Payer: Self-pay | Admitting: Nurse Practitioner

## 2022-07-18 ENCOUNTER — Other Ambulatory Visit (HOSPITAL_COMMUNITY): Payer: Self-pay | Admitting: Neurology

## 2022-07-18 DIAGNOSIS — G3184 Mild cognitive impairment, so stated: Secondary | ICD-10-CM

## 2022-09-22 ENCOUNTER — Ambulatory Visit (HOSPITAL_COMMUNITY): Admission: RE | Admit: 2022-09-22 | Payer: BLUE CROSS/BLUE SHIELD | Source: Ambulatory Visit

## 2022-09-22 ENCOUNTER — Encounter (HOSPITAL_COMMUNITY): Payer: Self-pay

## 2022-10-26 ENCOUNTER — Ambulatory Visit (INDEPENDENT_AMBULATORY_CARE_PROVIDER_SITE_OTHER): Payer: Medicare Other | Admitting: Nurse Practitioner

## 2022-10-26 ENCOUNTER — Emergency Department
Admission: EM | Admit: 2022-10-26 | Discharge: 2022-10-26 | Disposition: A | Discharge status: Home / Self Care | Payer: Medicare Other | Attending: Emergency Medicine | Admitting: Emergency Medicine

## 2022-10-26 ENCOUNTER — Encounter: Payer: Self-pay | Admitting: Nurse Practitioner

## 2022-10-26 ENCOUNTER — Encounter: Payer: Self-pay | Admitting: Emergency Medicine

## 2022-10-26 VITALS — BP 128/86 | HR 60 | Temp 97.8°F | Ht 69.06 in | Wt 237.5 lb

## 2022-10-26 DIAGNOSIS — Z23 Encounter for immunization: Secondary | ICD-10-CM | POA: Diagnosis not present

## 2022-10-26 DIAGNOSIS — R413 Other amnesia: Secondary | ICD-10-CM

## 2022-10-26 DIAGNOSIS — D696 Thrombocytopenia, unspecified: Secondary | ICD-10-CM

## 2022-10-26 DIAGNOSIS — J4550 Severe persistent asthma, uncomplicated: Secondary | ICD-10-CM

## 2022-10-26 DIAGNOSIS — W1830XA Fall on same level, unspecified, initial encounter: Secondary | ICD-10-CM | POA: Diagnosis not present

## 2022-10-26 DIAGNOSIS — J324 Chronic pansinusitis: Secondary | ICD-10-CM

## 2022-10-26 DIAGNOSIS — I5042 Chronic combined systolic (congestive) and diastolic (congestive) heart failure: Secondary | ICD-10-CM | POA: Diagnosis not present

## 2022-10-26 DIAGNOSIS — E0590 Thyrotoxicosis, unspecified without thyrotoxic crisis or storm: Secondary | ICD-10-CM

## 2022-10-26 DIAGNOSIS — S60511A Abrasion of right hand, initial encounter: Secondary | ICD-10-CM | POA: Diagnosis present

## 2022-10-26 DIAGNOSIS — G4733 Obstructive sleep apnea (adult) (pediatric): Secondary | ICD-10-CM

## 2022-10-26 DIAGNOSIS — Z6831 Body mass index (BMI) 31.0-31.9, adult: Secondary | ICD-10-CM

## 2022-10-26 DIAGNOSIS — J709 Respiratory conditions due to unspecified external agent: Secondary | ICD-10-CM

## 2022-10-26 DIAGNOSIS — E6609 Other obesity due to excess calories: Secondary | ICD-10-CM

## 2022-10-26 DIAGNOSIS — I110 Hypertensive heart disease with heart failure: Secondary | ICD-10-CM | POA: Diagnosis not present

## 2022-10-26 DIAGNOSIS — R7301 Impaired fasting glucose: Secondary | ICD-10-CM

## 2022-10-26 DIAGNOSIS — Z95810 Presence of automatic (implantable) cardiac defibrillator: Secondary | ICD-10-CM

## 2022-10-26 DIAGNOSIS — E782 Mixed hyperlipidemia: Secondary | ICD-10-CM

## 2022-10-26 DIAGNOSIS — N400 Enlarged prostate without lower urinary tract symptoms: Secondary | ICD-10-CM

## 2022-10-26 LAB — MICROALBUMIN, URINE WAIVED
Creatinine, Urine Waived: 200 mg/dL (ref 10–300)
Microalb, Ur Waived: 10 mg/L (ref 0–19)
Microalb/Creat Ratio: <30 mg/g (ref <30)

## 2022-10-26 MED ORDER — TETANUS-DIPHTH-ACELL PERTUSSIS 5-2.5-18.5 LF-MCG/0.5 IM SUSY
0.5000 mL | PREFILLED_SYRINGE | Freq: Once | INTRAMUSCULAR | Status: AC
  Administered 2022-10-26: 0.5 mL via INTRAMUSCULAR
  Filled 2022-10-26: qty 0.5

## 2022-10-26 NOTE — Assessment & Plan Note (Addendum)
No longer uses CPAP -- has not used in >2 years since weight loss -- with memory changes may benefit from retesting of OSA in future.

## 2022-10-26 NOTE — Patient Instructions (Signed)

## 2022-10-26 NOTE — Assessment & Plan Note (Signed)
Ongoing, stable.  Noted in past on labs, recheck CBC today and monitor closely.

## 2022-10-26 NOTE — Assessment & Plan Note (Signed)
Continue collaboration with cardiology team at [facility]. 

## 2022-10-26 NOTE — Assessment & Plan Note (Signed)
BMI 35.02, has lost significant weight over past few years = total of 150 lbs.  Continue keto diet and collaboration with Dr. Leeanne Rio.

## 2022-10-26 NOTE — Assessment & Plan Note (Signed)
Chronic, ongoing.  Continue Crestor and supplements daily.  Obtain lipid panel today. 

## 2022-10-26 NOTE — Assessment & Plan Note (Signed)
Recent labs with waxing and waning of thyroid while on Amiodarone -- hypo to hyper levels.  Is off Amiodarone and recent labs stable.  Initiate medication as needed.  Labs today.

## 2022-10-26 NOTE — ED Triage Notes (Signed)
Pt presents ambulatory to triage via POV with complaints of R palm/thumb injury following a fall tonight. Pt states he was helping a neighbor move in an old house and he fell through a concrete slab; has a small cut on his thumb - believed he cut it on gravel. Pt has an abrasion on the left side of his stomach from the concrete. A&Ox4 at this time. Denies CP or SOB.

## 2022-10-26 NOTE — Assessment & Plan Note (Signed)
Ongoing and stable at this time. Euvolemic today.  Reviewed recent cardiology note.  Continue current medication regimen and collaboration with cardiology.  Recommend: - Reminded to call for an overnight weight gain of >2 pounds or a weekly weight weight of >5 pounds - not adding salt to his food and has been reading food labels. Reviewed the importance of keeping daily sodium intake to <2000mg daily  - Avoid Ibuprofen products 

## 2022-10-26 NOTE — Progress Notes (Signed)
BP 128/86   Pulse 60   Temp 97.8 F (36.6 C) (Oral)   Ht 5' 9.06" (1.754 m)   Wt 237 lb 8 oz (107.7 kg)   SpO2 92%   BMI 35.02 kg/m    Subjective:    Patient ID: Thomas Calhoun, male    DOB: 25-Jan-1956, 67 y.o.   MRN: [id number]  HPI: Thomas Calhoun is a 67 y.o. male  Chief Complaint  Patient presents with   Hyperlipidemia   Hypertension   Congestive Heart Failure   Gastroesophageal Reflux   Asthma   Sleep Apnea   IFG   HYPERTENSION / [facility] cardiology visit 06/27/22 , they took him off Amiodarone last year.  ICD in place, May 2020, last check 09/06/22.  History of IFG with recent A1c 5% February 2023 -- has been stable since weight loss.  No CPAP in over 2 years since losing weight.  Continues Metoprolol, Losartan + Crestor. Lost > 150 pounds total over past years, continues to work with Dr. Enrigue Catena. Satisfied with current treatment? yes  Duration of hypertension: chronic  BP monitoring frequency: not checking BP range:  BP medication side effects: no  Duration of hyperlipidemia: chronic   Cholesterol supplements: none  Aspirin: no  Recent stressors: no  Recurrent headaches: no  Visual changes: no  Palpitations: no  Dyspnea: no  Chest pain: no  Lower extremity edema: no  Dizzy/lightheaded: no  The 10-year ASCVD risk score (Arnett DK, et al., 2019) is: 10.9%   Values used to calculate the score:     Age: 76 years     Sex: Male     Is Non-Hispanic African American: No     Diabetic: No     Tobacco smoker: No     Systolic Blood Pressure: 0000000 mmHg     Is BP treated: Yes     HDL Cholesterol: 74 mg/dL     Total Cholesterol: 178 mg/dL  THYROID ISSUES & MEMORY Was on Amiodarone and caused thyroid issues -- recent labs have been stable off medication. Saw neurology last 06/20/22 for mild cognitive impairment, CT scan in August 2023 no acute findings.  Currently taking B complex and memory supplement. Etiology of hypothyroidism: was on  Amiodarone Fatigue: no Cold intolerance: no Heat intolerance: no Weight gain: no Weight loss: no Constipation: no Diarrhea/loose stools: no Palpitations: no Lower extremity edema: no Anxiety/depressed mood: no     05/31/2022    9:42 AM 04/05/2022    9:51 AM  MMSE - Mini Mental State Exam  Orientation to time 5 5  Orientation to Place 5 5  Registration 3 3  Attention/ Calculation 5 5  Recall 3 2  Language- name 2 objects 2 2  Language- repeat 1 1  Language- follow 3 step command 3 3  Language- read & follow direction 1 1  Write a sentence 1 1  Copy design 1 1  Total score 30 29    ASTHMA  Saw pulmonary, Dr. Raul Del on 09/29/22. Continues on Nucala, Advair, Albuterol, Claritin, Singulair, Spiriva.  He is also followed by ENT (04/20/22) and Allergist at [facility] -- recently placed on Prednisone due to increased allergies with pollen. Asthma status: stable  Satisfied with current treatment?: yes  Albuterol/rescue inhaler frequency: occasional recently with pollen Dyspnea frequency: no  Wheezing frequency: occasional Cough frequency: with pollen increase Nocturnal symptom frequency: no Limitation of activity: no  Current upper respiratory symptoms: no  Triggers: seasonal  Home  peak flows: none  Last Spirometry: with pulmonary  Failed/intolerant to following asthma meds: unknown Asthma meds in past: unknown  Aerochamber/spacer use: no  Visits to ER or Urgent Care in past year: no  Pneumovax: Not up to Date  Influenza: Up to Date      10/26/2022   10:06 AM 05/31/2022    9:17 AM 04/05/2022    9:18 AM 10/06/2021   11:06 AM 07/20/2020   11:05 AM  Depression screen PHQ 2/9  Decreased Interest 0 0 0 0 0  Down, Depressed, Hopeless 0 0 0 0 0  PHQ - 2 Score 0 0 0 0 0  Altered sleeping 0 0 0 0 0  Tired, decreased energy 0 0 0 0 0  Change in appetite 0 0 0 0 0  Feeling bad or failure about yourself  0 0 0 0 0  Trouble concentrating 0 0 0 0 0  Moving slowly or fidgety/restless 0 0  0 0 0  Suicidal thoughts 0 0 0 0 0  PHQ-9 Score 0 0 0 0 0  Difficult doing work/chores Not difficult at all Not difficult at all Not difficult at all Not difficult at all Not difficult at all       10/26/2022   10:06 AM 05/31/2022    9:16 AM 04/05/2022    9:19 AM  GAD 7 : Generalized Anxiety Score  Nervous, Anxious, on Edge 0 0 0  Control/stop worrying 0 0 0  Worry too much - different things 0 1 0  Trouble relaxing 0 0 0  Restless 0 0 0  Easily annoyed or irritable 0 0 0  Afraid - awful might happen 0 0 0  Total GAD 7 Score 0 1 0  Anxiety Difficulty Not difficult at all Not difficult at all Not difficult at all   Relevant past medical, surgical, family and social history reviewed and updated as indicated. Interim medical history since our last visit reviewed. Allergies and medications reviewed and updated.  Review of Systems  Constitutional:  Negative for activity change, diaphoresis, fatigue and fever.  Respiratory:  Positive for wheezing. Negative for cough, chest tightness and shortness of breath.   Cardiovascular:  Negative for chest pain, palpitations and leg swelling.  Gastrointestinal: Negative.   Neurological: Negative.   Psychiatric/Behavioral: Negative.      Per HPI unless specifically indicated above     Objective:    BP 128/86   Pulse 60   Temp 97.8 F (36.6 C) (Oral)   Ht 5' 9.06" (1.754 m)   Wt 237 lb 8 oz (107.7 kg)   SpO2 92%   BMI 35.02 kg/m   Wt Readings from Last 3 Encounters:  10/26/22 237 lb 8 oz (107.7 kg)  05/31/22 233 lb 9.6 oz (106 kg)  04/05/22 229 lb 9.6 oz (104.1 kg)    Physical Exam Vitals and nursing note reviewed.  Constitutional:      General: He is awake. He is not in acute distress.    Appearance: He is well-developed and well-groomed. He is obese. He is not ill-appearing or toxic-appearing.  HENT:     Head: Normocephalic and atraumatic.     Right Ear: Hearing, tympanic membrane, ear canal and external ear normal. No  drainage.     Left Ear: Hearing, tympanic membrane, ear canal and external ear normal. No drainage.  Eyes:     General: Lids are normal.        Right eye: No discharge.  Left eye: No discharge.     Conjunctiva/sclera: Conjunctivae normal.     Pupils: Pupils are equal, round, and reactive to light.  Neck:     Thyroid: No thyromegaly.     Vascular: No carotid bruit.     Trachea: Trachea normal.  Cardiovascular:     Rate and Rhythm: Normal rate and regular rhythm.     Heart sounds: Normal heart sounds, S1 normal and S2 normal. No murmur heard.    No gallop.  Pulmonary:     Effort: Pulmonary effort is normal. No accessory muscle usage or respiratory distress.     Breath sounds: Wheezing present. No decreased breath sounds or rhonchi.     Comments: Intermittent wheezes expiratory noted throughout. Abdominal:     General: Bowel sounds are normal. There is no distension.     Palpations: Abdomen is soft.  Musculoskeletal:        General: Normal range of motion.     Cervical back: Normal range of motion and neck supple.     Right lower leg: No edema.     Left lower leg: No edema.  Lymphadenopathy:     Cervical: No cervical adenopathy.  Skin:    General: Skin is warm and dry.     Capillary Refill: Capillary refill takes less than 2 seconds.  Neurological:     Mental Status: He is alert and oriented to person, place, and time.     Cranial Nerves: Cranial nerves 2-12 are intact.     Deep Tendon Reflexes: Reflexes are normal and symmetric.     Reflex Scores:      Brachioradialis reflexes are 2+ on the right side and 2+ on the left side.      Patellar reflexes are 2+ on the right side and 2+ on the left side.    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  Psychiatric:        Attention and Perception: Attention normal.        Mood and Affect: Mood normal.        Speech: Speech normal.        Behavior: Behavior normal. Behavior is cooperative.         Thought Content: Thought content normal.    Results for orders placed or performed in visit on 05/04/22  CBC with Differential/Platelet  Result Value Ref Range   WBC 5.4 3.4 - 10.8 x10E3/uL   RBC 4.59 4.14 - 5.80 x10E6/uL   Hemoglobin 15.9 13.0 - 17.7 g/dL   Hematocrit 46.3 37.5 - 51.0 %   MCV 101 (H) 79 - 97 fL   MCH 34.6 (H) 26.6 - 33.0 pg   MCHC 34.3 31.5 - 35.7 g/dL   RDW 13.1 11.6 - 15.4 %   Platelets 105 (L) 150 - 450 x10E3/uL   Neutrophils 66 Not Estab. %   Lymphs 22 Not Estab. %   Monocytes 9 Not Estab. %   Eos 2 Not Estab. %   Basos 1 Not Estab. %   Neutrophils Absolute 3.6 1.4 - 7.0 x10E3/uL   Lymphocytes Absolute 1.2 0.7 - 3.1 x10E3/uL   Monocytes Absolute 0.5 0.1 - 0.9 x10E3/uL   EOS (ABSOLUTE) 0.1 0.0 - 0.4 x10E3/uL   Basophils Absolute 0.0 0.0 - 0.2 x10E3/uL   Immature Granulocytes 0 Not Estab. %   Immature Grans (Abs) 0.0 0.0 - 0.1 x10E3/uL  T4, free  Result Value Ref Range   Free T4 1.24 0.82 - 1.77  ng/dL  TSH  Result Value Ref Range   TSH 3.430 0.450 - 4.500 uIU/mL      Assessment & Plan:   Problem List Items Addressed This Visit       Cardiovascular and Mediastinum   Chronic combined systolic and diastolic CHF (congestive heart failure) ([place]) - Primary    Ongoing and stable at this time. Euvolemic today.  Reviewed recent cardiology note.  Continue current medication regimen and collaboration with cardiology.  Recommend: - Reminded to call for an overnight weight gain of >2 pounds or a weekly weight weight of >5 pounds - not adding salt to his food and has been reading food labels. 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  BP at  goal in office.  Continue current medication regimen and collaboration with cardiology.  Recommend he monitor BP at least a few mornings a week at home and document.  DASH diet at home.  Notes reviewed.  LAB:  CBC, CMP, TSH, urine ALB.        Relevant Medications   furosemide (LASIX) 20 MG tablet   Other Relevant Orders   CBC with Differential/Platelet   Comprehensive metabolic panel   TSH   Microalbumin, Urine Waived     Respiratory   Extrinsic allergic respiratory disease (HCC)    Chronic, ongoing,  Followed by pulmonary and allergist.  Continue this collaboration and current medication regimen as prescribed by pulmonary.  Obtain CBC today.      Relevant Orders   CBC with Differential/Platelet   Obstructive sleep apnea    No longer uses CPAP -- has not used in >2 years since weight loss -- with memory changes may benefit from retesting of OSA in future.      Relevant Orders   CBC with Differential/Platelet   Severe asthma    Chronic, ongoing.  Continue current medication regimen and collaboration with pulmonary.  Recent noted reviewed, treated for exacerbation recently with Prednisone.      Relevant Orders   CBC with Differential/Platelet     Endocrine   Hyperthyroidism    Recent labs with waxing and waning of thyroid while on Amiodarone -- hypo to hyper levels.  Is off Amiodarone and recent labs stable.  Initiate medication as needed.  Labs today.      Relevant Orders   T4, free   TSH   IFG (impaired fasting glucose)    Recent labs with normal range glucose, continue to monitor.  Has lost significant weight.      Relevant Orders   Comprehensive metabolic panel     Hematopoietic and Hemostatic   Thrombocytopenia (HCC)    Ongoing, stable.  Noted in past on labs, recheck CBC today and monitor closely.      Relevant Orders   CBC with Differential/Platelet     Other   Hyperlipidemia    Chronic, ongoing.  Continue Crestor and supplements daily.  Obtain lipid panel  today.      Relevant Medications   furosemide (LASIX) 20 MG tablet   Other Relevant Orders   Comprehensive metabolic panel   Lipid Panel w/o Chol/HDL Ratio   ICD (implantable cardioverter-defibrillator) in  place    Continue collaboration with [facility] team at [facility].      Memory changes    MMSE 29/30 to 30/30 on checks.  Recent imaging was reassuring.  Continue current supplements and collaboration with neurology.  Recent notes reviewed.      Obesity    BMI 35.02, has lost significant weight over past few years = total of 150 lbs.  Continue keto diet and collaboration with Dr. Leeanne Rio.      Other Visit Diagnoses     Benign prostatic hyperplasia without lower urinary tract symptoms       PSA on labs today.   Relevant Orders   PSA        Follow up plan: Return in about 6 months (around 04/28/2023) for HTN/HLD, ASTHMA, HF, MEMORY.

## 2022-10-26 NOTE — Assessment & Plan Note (Signed)
MMSE 29/30 to 30/30 on checks.  Recent imaging was reassuring.  Continue current supplements and collaboration with neurology.  Recent notes reviewed.

## 2022-10-26 NOTE — Discharge Instructions (Addendum)
Keep glue clean covered and dry for 1 week.  In 1 week you may shower and get wet and allow Dermabond to come off on its own.  Return for any warmth redness swelling or drainage

## 2022-10-26 NOTE — Assessment & Plan Note (Signed)
Chronic, stable.  BP at goal in office.  Continue current medication regimen and collaboration with cardiology.  Recommend he monitor BP at least a few mornings a week at home and document.  DASH diet at home.  Notes reviewed.  LAB:  CBC, CMP, TSH, urine ALB.

## 2022-10-26 NOTE — ED Provider Notes (Signed)
[facility] AT [place] Provider Note   CSN: [id number] Arrival date & time: 10/26/22  2010     History  Chief Complaint  Patient presents with   Hand Injury    Thomas Calhoun is a 67 y.o. male.  Presents to the emergency department valuation of a fall earlier today, states he fell onto some gravel and suffered abrasion to the right thenar eminence.  He has a skin tear 1 x 1 cm with some bleeding.  No laceration.  No visible or palpable foreign body.  He has no pain at the hand or thumb joint concerning for fracture.  He denies any other pain or injury to his body.  No head injury LOC nausea or vomiting.  Patient states he had a mechanical fall and denies any preceding chest pain shortness of breath dizziness or lightheadedness.  HPI     Home Medications Prior to Admission medications   Medication Sig Start Date End Date Taking? Authorizing Provider  albuterol (PROVENTIL HFA;VENTOLIN HFA) 108 (90 BASE) MCG/ACT inhaler Inhale 2 puffs into the lungs every 6 (six) hours as needed for wheezing or shortness of breath.    [provider]  albuterol (PROVENTIL) (2.5 MG/3ML) 0.083% nebulizer solution Take 2.5 mg by nebulization every 6 (six) hours as needed for wheezing or shortness of breath.  03/09/16   [provider]  azelastine (ASTELIN) 0.1 % nasal spray Place 2 sprays into both nostrils 2 (two) times daily. Use in each nostril as directed 10/06/21   Cannady, Henrine Screws T, NP  Fluticasone-Salmeterol (ADVAIR) 500-50 MCG/DOSE AEPB Inhale 1 puff into the lungs 2 (two) times daily.    [provider]  furosemide (LASIX) 20 MG tablet Take 1 tablet by mouth daily. 06/27/22   [provider]  loratadine (CLARITIN) 10 MG tablet Take 10 mg by mouth daily.    [provider]  losartan (COZAAR) 25 MG tablet Take 25 mg by mouth daily.    [provider]  mepolizumab (NUCALA) 100 MG injection Inject 100 mg into the skin  every 30 (thirty) days.  08/08/18   [provider]  metoprolol succinate (TOPROL-XL) 25 MG 24 hr tablet Take 25 mg by mouth daily.    [provider]  mometasone [facility]) 220 MCG/INH inhaler Inhale 2 puffs into the lungs daily. Nasal wash    [provider]  montelukast (SINGULAIR) 10 MG tablet Take 10 mg by mouth at bedtime.  01/19/15   [provider]  NEOMYCIN-POLYMYXIN-HYDROCORTISONE (CORTISPORIN) 1 % SOLN OTIC solution INSTILL 5 DROPS TO LEFT EAR THREE TIMES DAILY FOR 14 DAYS Patient not taking: Reported on 10/26/2022 09/23/20   [provider]  Omega-3 Fatty Acids (FISH OIL) 1000 MG CAPS Take 1 capsule by mouth daily.    [provider]  rosuvastatin (CRESTOR) 10 MG tablet Take 10 mg by mouth at bedtime. 01/12/21   [provider]  SPIRIVA HANDIHALER 18 MCG inhalation capsule Place into inhaler and inhale daily. 09/25/15   [provider]  triamcinolone cream (KENALOG) 0.1 % Apply 1 Application topically 2 (two) times daily. 04/05/22   Cannady, Henrine Screws T, NP  vitamin C (ASCORBIC ACID) 500 MG tablet Take 500 mg by mouth daily.    [provider]  vitamin E 400 UNIT capsule Take 400 Units by mouth daily.    [provider]  Zinc Gluconate 30 MG TABS Take by mouth.    [provider]  Allergies    Amlodipine besylate, Moxifloxacin, Avelox [moxifloxacin hcl in nacl], and Micardis [telmisartan]    Review of Systems   Review of Systems  Physical Exam Updated Vital Signs BP 127/79 (BP Location: Left Arm)   Pulse 72   Temp 98.1 F (36.7 C) (Oral)   Resp 18   SpO2 98%  Physical Exam Constitutional:      Appearance: He is well-developed.  HENT:     Head: Normocephalic and atraumatic.  Eyes:     Conjunctiva/sclera: Conjunctivae normal.  Cardiovascular:     Rate and Rhythm: Normal rate.  Pulmonary:     Effort: Pulmonary effort is normal. No respiratory distress.  Musculoskeletal:         General: Normal range of motion.     Cervical back: Normal range of motion.     Comments: Right hand with 1.5 x 1.5 cm superficial skin tear along the thenar eminence.  No tenderness to palpation throughout the thumb remainder of the right hand.  No limitation of thumb range of motion.  No visible or palpable foreign body.  Wound is clean with no active bleeding  Skin:    General: Skin is warm.     Findings: No rash.  Neurological:     Mental Status: He is alert and oriented to person, place, and time.  Psychiatric:        Behavior: Behavior normal.        Thought Content: Thought content normal.     ED Results / Procedures / Treatments   Labs (all labs ordered are listed, but only abnormal results are displayed) Labs Reviewed - No data to display  EKG None  Radiology No results found.  Procedures Procedures  Right hand skin tear cleansed with Betadine and saline and no visible or palpable foreign body identified and Dermabond applied.   Medications Ordered in ED Medications  Tdap (BOOSTRIX) injection 0.5 mL (has no administration in time range)    ED Course/ Medical Decision Making/ A&P                             Medical Decision Making Risk Prescription drug management.  67 year old male with right hand injury, superficial skin tear.  No active bleeding.  No visible or palpable foreign body.  No concern for any bony abnormality.  Wound was thoroughly cleansed and Dermabond applied over the wound.  He is educated on wound care and signs symptoms return to the ER for. Final Clinical Impression(s) / ED Diagnoses Final diagnoses:  Abrasion of right hand, initial encounter    Rx / DC Orders ED Discharge Orders     None         Renata Caprice 10/26/22 2212    Harvest Dark, MD 10/27/22 2123

## 2022-10-26 NOTE — Assessment & Plan Note (Signed)
Chronic, ongoing.  Continue current medication regimen and collaboration with pulmonary.  Recent noted reviewed, treated for exacerbation recently with Prednisone.

## 2022-10-26 NOTE — Assessment & Plan Note (Signed)
Chronic, ongoing,  Followed by pulmonary and allergist.  Continue this collaboration and current medication regimen as prescribed by pulmonary.  Obtain CBC today. 

## 2022-10-26 NOTE — Assessment & Plan Note (Signed)
Recent labs with normal range glucose, continue to monitor.  Has lost significant weight. 

## 2022-10-27 ENCOUNTER — Other Ambulatory Visit: Payer: Self-pay | Admitting: Nurse Practitioner

## 2022-10-27 DIAGNOSIS — E0590 Thyrotoxicosis, unspecified without thyrotoxic crisis or storm: Secondary | ICD-10-CM

## 2022-10-27 LAB — CBC WITH DIFFERENTIAL/PLATELET

## 2022-10-27 NOTE — Progress Notes (Signed)
Contacted via [place] -- need lab only visit in 4 weeks please.   Good evening Thomas Calhoun, your labs are returning: - Kidney function, creatinine and eGFR, remains normal, as is liver function, AST and ALT.  - Cholesterol levels are elevated this check, were you fasting?  Let me know as I may like to recheck these with you fasting if you were not.  If you were fasting I would recommend we go up on Rosuvastatin for better prevention and to get LDL <70. - Thyroid labs show elevation in TSH again, but normal Free T4.  I would like to recheck these in 4 weeks via outpatient labs.  I will have staff call to schedule this with you. - Prostate labs is normal.  CBC has not returned yet, but if abnormal I will alert you.  Any questions? Keep being amazing!!  Thank you for allowing me to participate in your care.  I appreciate you. Kindest regards, Ayinde Swim

## 2022-10-28 LAB — CBC WITH DIFFERENTIAL/PLATELET
Basophils Absolute: 0 10*3/uL (ref 0.0–0.2)
Basos: 1 %
EOS (ABSOLUTE): 0.1 10*3/uL (ref 0.0–0.4)
Eos: 2 %
Hematocrit: 49.1 % (ref 37.5–51.0)
Hemoglobin: 16.8 g/dL (ref 13.0–17.7)
Immature Grans (Abs): 0 10*3/uL (ref 0.0–0.1)
Immature Granulocytes: 0 %
Lymphocytes Absolute: 1.6 10*3/uL (ref 0.7–3.1)
Lymphs: 29 %
MCH: 34.5 pg — ABNORMAL HIGH (ref 26.6–33.0)
MCHC: 34.2 g/dL (ref 31.5–35.7)
MCV: 101 fL — ABNORMAL HIGH (ref 79–97)
Monocytes Absolute: 0.6 10*3/uL (ref 0.1–0.9)
Monocytes: 10 %
Neutrophils Absolute: 3.3 10*3/uL (ref 1.4–7.0)
Neutrophils: 58 %
Platelets: 100 10*3/uL — CL (ref 150–450)
RBC: 4.87 x10E6/uL (ref 4.14–5.80)
RDW: 12.3 % (ref 11.6–15.4)
WBC: 5.6 10*3/uL (ref 3.4–10.8)

## 2022-10-28 LAB — LIPID PANEL W/O CHOL/HDL RATIO
Cholesterol, Total: 216 mg/dL — ABNORMAL HIGH (ref 100–199)
HDL: 74 mg/dL (ref >39)
LDL Chol Calc (NIH): 129 mg/dL — ABNORMAL HIGH (ref 0–99)
Triglycerides: 76 mg/dL (ref 0–149)
VLDL Cholesterol Cal: 13 mg/dL (ref 5–40)

## 2022-10-28 LAB — COMPREHENSIVE METABOLIC PANEL
ALT: 25 IU/L (ref 0–44)
AST: 23 IU/L (ref 0–40)
Albumin/Globulin Ratio: 2.3 — ABNORMAL HIGH (ref 1.2–2.2)
Albumin: 4.4 g/dL (ref 3.9–4.9)
Alkaline Phosphatase: 50 IU/L (ref 44–121)
BUN/Creatinine Ratio: 15 (ref 10–24)
BUN: 18 mg/dL (ref 8–27)
Bilirubin Total: 1.5 mg/dL — ABNORMAL HIGH (ref 0.0–1.2)
CO2: 24 mmol/L (ref 20–29)
Calcium: 9.4 mg/dL (ref 8.6–10.2)
Chloride: 102 mmol/L (ref 96–106)
Creatinine, Ser: 1.19 mg/dL (ref 0.76–1.27)
Globulin, Total: 1.9 g/dL (ref 1.5–4.5)
Glucose: 78 mg/dL (ref 70–99)
Potassium: 3.9 mmol/L (ref 3.5–5.2)
Sodium: 142 mmol/L (ref 134–144)
Total Protein: 6.3 g/dL (ref 6.0–8.5)
eGFR: 68 mL/min/{1.73_m2} (ref >59)

## 2022-10-28 LAB — PSA: Prostate Specific Ag, Serum: 0.7 ng/mL (ref 0.0–4.0)

## 2022-10-28 LAB — TSH: TSH: 4.82 u[IU]/mL — ABNORMAL HIGH (ref 0.450–4.500)

## 2022-10-28 LAB — T4, FREE: Free T4: 1.4 ng/dL (ref 0.82–1.77)

## 2022-10-28 NOTE — Progress Notes (Signed)
Contacted via MyChart   Good evening Thomas Calhoun, your CBC returned and platelet count remains on lower side but stable, we will continue to monitor closely.  Any questions?

## 2022-10-30 ENCOUNTER — Encounter: Payer: Self-pay | Admitting: Nurse Practitioner

## 2022-10-30 ENCOUNTER — Ambulatory Visit: Payer: Medicare Other | Admitting: Nurse Practitioner

## 2022-10-30 NOTE — Progress Notes (Signed)
Schedule appointment for 11/27/2022 @ 10:20 am.

## 2022-11-16 ENCOUNTER — Encounter: Payer: Self-pay | Admitting: Nurse Practitioner

## 2022-11-17 ENCOUNTER — Encounter: Payer: Self-pay | Admitting: Nurse Practitioner

## 2022-11-27 ENCOUNTER — Telehealth: Payer: Self-pay | Admitting: Nurse Practitioner

## 2022-11-27 ENCOUNTER — Other Ambulatory Visit: Payer: Medicare Other

## 2022-11-27 DIAGNOSIS — E0590 Thyrotoxicosis, unspecified without thyrotoxic crisis or storm: Secondary | ICD-10-CM

## 2022-11-27 NOTE — Telephone Encounter (Signed)
Contacted Thomas Calhoun to schedule their annual wellness visit. Appointment made for 12/12/2022.  [name]; Care Guide Ambulatory Clinical Support [place] [facility] Direct Dial: [phone]

## 2022-11-28 LAB — T4, FREE: Free T4: 1.39 ng/dL (ref 0.82–1.77)

## 2022-11-28 LAB — TSH: TSH: 3.95 u[IU]/mL (ref 0.450–4.500)

## 2022-11-28 NOTE — Progress Notes (Signed)
Contacted via MyChart   Thyroid labs in normal range this check.  Good news!!

## 2022-12-12 ENCOUNTER — Ambulatory Visit (INDEPENDENT_AMBULATORY_CARE_PROVIDER_SITE_OTHER): Payer: Medicare Other

## 2022-12-12 VITALS — Ht 69.0 in | Wt 237.0 lb

## 2022-12-12 DIAGNOSIS — Z0000 Encounter for general adult medical examination without abnormal findings: Secondary | ICD-10-CM

## 2022-12-12 NOTE — Patient Instructions (Signed)
Mr. Thomas Calhoun , Thank you for taking time to come for your Medicare Wellness Visit. I appreciate your ongoing commitment to your health goals. Please review the following plan we discussed and let me know if I can assist you in the future.   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, and HFpEF, hx VT followed by cardiology  HFpEF, Hx VT: metoprolol succinate 25 mg daily, spironolactone 25 mg daily, losartan 25 mg daily Severe allergic respiratory disease: Nucala Q30 days; Wixela 250/50 mcg BID, Spiriva 81 mg daily, loratidine 10 mg daily, montelukast 10 mg daily, theophylline 400 mg daily, prednisone 5 mg every other day Notes that he bough a wrist BP cuff, but that it is very inaccurate and results are non-reproducible Reviewed recent med changes: lisinopril replaced by losartan, amiodarone dose reduction Patient recently had a sleep study completed, but has not heard anything regarding results or if he needs new equipment  Pharmacist Clinical Goal(s):  Over the next 90 days, patient will work with PharmD and medical team to address needs related to polypharmacy  Interventions: Comprehensive medication review performed. Electronic medical record list updated Discussed w/ patient to see if he could exchange wrist cuff for an arm BP cuff. He notes he will try to do this.  Discussed that if he had not heard anything from sleep study, likely no changes need to be made. Recommended he contact sleep study location or DME supplier if he has questions.  Patient Self Care Activities:  Self administers medications as prescribed Calls provider office for new concerns or questions  Please see past updates related to this goal by clicking on the "Past Updates" button in the selected goal        DIET - EAT MORE FRUITS AND VEGETABLES         This is a list of the screening recommended for you and due dates:  Health Maintenance  Topic Date Due   Pneumonia Vaccine (1 of 2 - PCV) Never done   Zoster (Shingles) Vaccine (1 of 2) Never done   COVID-19 Vaccine (4 - 2023-24 season) 04/14/2022   Flu Shot  03/15/2023   Medicare Annual Wellness Visit  12/12/2023   Colon Cancer Screening  11/10/2024   DTaP/Tdap/Td vaccine (5 - Td or Tdap) 10/25/2032   Hepatitis C Screening: USPSTF Recommendation to screen - Ages 39-79 yo.  Completed   HPV Vaccine  Aged Out    Advanced directives: no  Conditions/risks identified: none  Next appointment: Follow up in one year for your annual wellness visit. 12/18/23 @ 9:00 am by phone  Preventive Care 65 Years and Older, Male  Preventive care refers to lifestyle choices and visits with your health care provider that can promote health and wellness. What does preventive care include? A yearly physical exam. This is also called an annual well check. Dental exams once or twice a year. Routine eye exams. Ask your health care provider how often you should have your eyes checked. Personal lifestyle choices, including: Daily care of your teeth and gums. Regular physical activity. Eating a healthy diet. Avoiding tobacco and drug use. Limiting alcohol use. Practicing safe sex. Taking low doses of aspirin every day. Taking vitamin and mineral supplements as recommended by your health care provider. What happens during an annual well check? The services  and screenings done by your health care provider during your annual well check will depend on your age, overall health, lifestyle risk factors, and family history of disease. Counseling  Your health care provider may ask you questions about your: Alcohol use. Tobacco use. Drug use. Emotional well-being. Home and relationship well-being. Sexual activity. Eating habits. History of falls. Memory and ability to understand (cognition). Work and work  Astronomer. Screening  You may have the following tests or measurements: Height, weight, and BMI. Blood pressure. Lipid and cholesterol levels. These may be checked every 5 years, or more frequently if you are over 68 years old. Skin check. Lung cancer screening. You may have this screening every year starting at age 36 if you have a 30-pack-year history of smoking and currently smoke or have quit within the past 15 years. Fecal occult blood test (FOBT) of the stool. You may have this test every year starting at age 66. Flexible sigmoidoscopy or colonoscopy. You may have a sigmoidoscopy every 5 years or a colonoscopy every 10 years starting at age 69. Prostate cancer screening. Recommendations will vary depending on your family history and other risks. Hepatitis C blood test. Hepatitis B blood test. Sexually transmitted disease (STD) testing. Diabetes screening. This is done by checking your blood sugar (glucose) after you have not eaten for a while (fasting). You may have this done every 1-3 years. Abdominal aortic aneurysm (AAA) screening. You may need this if you are a current or former smoker. Osteoporosis. You may be screened starting at age 49 if you are at high risk. Talk with your health care provider about your test results, treatment options, and if necessary, the need for more tests. Vaccines  Your health care provider may recommend certain vaccines, such as: Influenza vaccine. This is recommended every year. Tetanus, diphtheria, and acellular pertussis (Tdap, Td) vaccine. You may need a Td booster every 10 years. Zoster vaccine. You may need this after age 62. Pneumococcal 13-valent conjugate (PCV13) vaccine. One dose is recommended after age 32. Pneumococcal polysaccharide (PPSV23) vaccine. One dose is recommended after age 11. Talk to your health care provider about which screenings and vaccines you need and how often you need them. This information is not intended to replace  advice given to you by your health care provider. Make sure you discuss any questions you have with your health care provider. Document Released: 08/27/2015 Document Revised: 04/19/2016 Document Reviewed: 06/01/2015 Elsevier Interactive Patient Education  2017 [employer].  Fall Prevention in the Home Falls can cause injuries. They can happen to people of all ages. There are many things you can do to make your home safe and to help prevent falls. What can I do on the outside of my home? Regularly fix the edges of walkways and driveways and fix any cracks. Remove anything that might make you trip as you walk through a door, such as a raised step or threshold. Trim any bushes or trees on the path to your home. Use bright outdoor lighting. Clear any walking paths of anything that might make someone trip, such as rocks or tools. Regularly check to see if handrails are loose or broken. Make sure that both sides of any steps have handrails. Any raised decks and porches should have guardrails on the edges. Have any leaves, snow, or ice cleared regularly. Use sand or salt on walking paths during winter. Clean up any spills in your garage right away. This includes oil or grease spills. What can I do in  the bathroom? Use night lights. Install grab bars by the toilet and in the tub and shower. Do not use towel bars as grab bars. Use non-skid mats or decals in the tub or shower. If you need to sit down in the shower, use a plastic, non-slip stool. Keep the floor dry. Clean up any water that spills on the floor as soon as it happens. Remove soap buildup in the tub or shower regularly. Attach bath mats securely with double-sided non-slip rug tape. Do not have throw rugs and other things on the floor that can make you trip. What can I do in the bedroom? Use night lights. Make sure that you have a light by your bed that is easy to reach. Do not use any sheets or blankets that are too big for your bed.  They should not hang down onto the floor. Have a firm chair that has side arms. You can use this for support while you get dressed. Do not have throw rugs and other things on the floor that can make you trip. What can I do in the kitchen? Clean up any spills right away. Avoid walking on wet floors. Keep items that you use a lot in easy-to-reach places. If you need to reach something above you, use a strong step stool that has a grab bar. Keep electrical cords out of the way. Do not use floor polish or wax that makes floors slippery. If you must use wax, use non-skid floor wax. Do not have throw rugs and other things on the floor that can make you trip. What can I do with my stairs? Do not leave any items on the stairs. Make sure that there are handrails on both sides of the stairs and use them. Fix handrails that are broken or loose. Make sure that handrails are as long as the stairways. Check any carpeting to make sure that it is firmly attached to the stairs. Fix any carpet that is loose or worn. Avoid having throw rugs at the top or bottom of the stairs. If you do have throw rugs, attach them to the floor with carpet tape. Make sure that you have a light switch at the top of the stairs and the bottom of the stairs. If you do not have them, ask someone to add them for you. What else can I do to help prevent falls? Wear shoes that: Do not have high heels. Have rubber bottoms. Are comfortable and fit you well. Are closed at the toe. Do not wear sandals. If you use a stepladder: Make sure that it is fully opened. Do not climb a closed stepladder. Make sure that both sides of the stepladder are locked into place. Ask someone to hold it for you, if possible. Clearly mark and make sure that you can see: Any grab bars or handrails. First and last steps. Where the edge of each step is. Use tools that help you move around (mobility aids) if they are needed. These  include: Canes. Walkers. Scooters. Crutches. Turn on the lights when you go into a dark area. Replace any light bulbs as soon as they burn out. Set up your furniture so you have a clear path. Avoid moving your furniture around. If any of your floors are uneven, fix them. If there are any pets around you, be aware of where they are. Review your medicines with your doctor. Some medicines can make you feel dizzy. This can increase your chance of falling. Ask your doctor  what other things that you can do to help prevent falls. This information is not intended to replace advice given to you by your health care provider. Make sure you discuss any questions you have with your health care provider. Document Released: 05/27/2009 Document Revised: 01/06/2016 Document Reviewed: 09/04/2014 Elsevier Interactive Patient Education  2017 [employer].

## 2022-12-12 NOTE — Progress Notes (Signed)
I connected with  Thomas Calhoun on 12/12/22 by a audio enabled telemedicine application and verified that I am speaking with the correct person using two identifiers.  Patient Location: Home  Provider Location: Office/Clinic  I discussed the limitations of evaluation and management by telemedicine. The patient expressed understanding and agreed to proceed.  Subjective:   Thomas Calhoun is a 67 y.o. male who presents for an Initial Medicare Annual Wellness Visit.  Review of Systems     Cardiac Risk Factors include: advanced age (>8men, >27 women);hypertension;male gender     Objective:    Today's Vitals   12/12/22 0929  PainSc: 0-No pain   There is no height or weight on file to calculate BMI.     12/12/2022    9:37 AM 12/27/2018    9:01 AM 12/27/2018    5:44 AM 07/18/2016    7:32 AM  Advanced Directives  Does Patient Have a Medical Advance Directive? No Yes Yes No  Type of Advance Directive  Living will;Healthcare Power of Attorney    Does patient want to make changes to medical advance directive?  No - Patient declined No - Patient declined   Copy of Healthcare Power of Attorney in Chart?  No - copy requested    Would patient like information on creating a medical advance directive? No - Patient declined       Current Medications (verified) Outpatient Encounter Medications as of 12/12/2022  Medication Sig   albuterol (PROVENTIL HFA;VENTOLIN HFA) 108 (90 BASE) MCG/ACT inhaler Inhale 2 puffs into the lungs every 6 (six) hours as needed for wheezing or shortness of breath.   albuterol (PROVENTIL) (2.5 MG/3ML) 0.083% nebulizer solution Take 2.5 mg by nebulization every 6 (six) hours as needed for wheezing or shortness of breath.    azelastine (ASTELIN) 0.1 % nasal spray Place 2 sprays into both nostrils 2 (two) times daily. Use in each nostril as directed   furosemide (LASIX) 20 MG tablet Take 1 tablet by mouth daily.   loratadine (CLARITIN) 10 MG tablet Take 10 mg by  mouth daily.   losartan (COZAAR) 25 MG tablet Take 25 mg by mouth daily.   mepolizumab (NUCALA) 100 MG injection Inject 100 mg into the skin every 30 (thirty) days.    metoprolol succinate (TOPROL-XL) 25 MG 24 hr tablet Take 25 mg by mouth daily.   mometasone (ASMANEX) 220 MCG/INH inhaler Inhale 2 puffs into the lungs daily. Nasal wash   montelukast (SINGULAIR) 10 MG tablet Take 10 mg by mouth at bedtime.    Omega-3 Fatty Acids (FISH OIL) 1000 MG CAPS Take 1 capsule by mouth daily.   rosuvastatin (CRESTOR) 10 MG tablet Take 10 mg by mouth at bedtime.   triamcinolone cream (KENALOG) 0.1 % Apply 1 Application topically 2 (two) times daily.   vitamin C (ASCORBIC ACID) 500 MG tablet Take 500 mg by mouth daily.   vitamin E 400 UNIT capsule Take 400 Units by mouth daily.   Zinc Gluconate 30 MG TABS Take by mouth.   Fluticasone-Salmeterol (ADVAIR) 500-50 MCG/DOSE AEPB Inhale 1 puff into the lungs 2 (two) times daily. (Patient not taking: Reported on 12/12/2022)   NEOMYCIN-POLYMYXIN-HYDROCORTISONE (CORTISPORIN) 1 % SOLN OTIC solution INSTILL 5 DROPS TO LEFT EAR THREE TIMES DAILY FOR 14 DAYS (Patient not taking: Reported on 10/26/2022)   SPIRIVA HANDIHALER 18 MCG inhalation capsule Place into inhaler and inhale daily. (Patient not taking: Reported on 12/12/2022)   No facility-administered encounter medications on file as of 12/12/2022.  Allergies (verified) Amlodipine besylate, Moxifloxacin, Avelox [moxifloxacin hcl in nacl], and Micardis [telmisartan]   History: Past Medical History:  Diagnosis Date   Abnormal serum enzyme level    AICD (automatic cardioverter/defibrillator) present    Asthma    Hyperlipidemia    Hypertension    IFG (impaired fasting glucose)    Meralgia paresthetica    Testicular hypofunction    Past Surgical History:  Procedure Laterality Date   APPENDECTOMY     COLONOSCOPY WITH PROPOFOL N/A 07/18/2016   Procedure: COLONOSCOPY WITH PROPOFOL;  Surgeon: Midge Minium, MD;   Location: [facility] ENDOSCOPY;  Service: Endoscopy;  Laterality: N/A;   COLONOSCOPY WITH PROPOFOL N/A 11/11/2019   Procedure: COLONOSCOPY WITH PROPOFOL;  Surgeon: Midge Minium, MD;  Location[facility] ENDOSCOPY;  Service: Endoscopy;  Laterality: N/A;   INNER EAR SURGERY Left 2018   repaird ear infection in the ear drum   LEFT HEART CATH AND CORONARY ANGIOGRAPHY N/A 12/27/2018   Procedure: LEFT HEART CATH AND CORONARY ANGIOGRAPHY possible PCI and stent;  Surgeon: Alwyn Pea, MD;  Location: [facility] INVASIVE CV LAB;  Service: Cardiovascular;  Laterality: N/A;   NASAL SINUS SURGERY     SMALL INTESTINE SURGERY     Family History  Problem Relation Age of Onset   Heart disease Mother    Hypertension Mother    Cancer Father        melanoma   Stroke Father        x2   Cancer Sister    Hypertension Son    ADD / ADHD Son    Social History   Socioeconomic History   Marital status: Married    Spouse name: Not on file   Number of children: Not on file   Years of education: Not on file   Highest education level: Not on file  Occupational History   Not on file  Tobacco Use   Smoking status: Never   Smokeless tobacco: Never  Vaping Use   Vaping Use: Never used  Substance and Sexual Activity   Alcohol use: Yes    Alcohol/week: 0.0 standard drinks of alcohol    Comment: occasional   Drug use: No   Sexual activity: Never  Other Topics Concern   Not on file  Social History Narrative   Not on file   Social Determinants of Health   Financial Resource Strain: Low Risk  (12/12/2022)   Overall Financial Resource Strain (CARDIA)    Difficulty of Paying Living Expenses: Not hard at all  Food Insecurity: No Food Insecurity (12/12/2022)   Hunger Vital Sign    Worried About Running Out of Food in the Last Year: Never true    Ran Out of Food in the Last Year: Never true  Transportation Needs: No Transportation Needs (12/12/2022)   PRAPARE - Administrator, Civil Service (Medical): No     Lack of Transportation (Non-Medical): No  Physical Activity: Sufficiently Active (12/12/2022)   Exercise Vital Sign    Days of Exercise per Week: 7 days    Minutes of Exercise per Session: 40 min  Stress: No Stress Concern Present (12/12/2022)   [employer] of Occupational Health - Occupational Stress Questionnaire    Feeling of Stress : Not at all  Social Connections: Moderately Isolated (12/12/2022)   Social Connection and Isolation Panel [NHANES]    Frequency of Communication with Friends and Family: More than three times a week    Frequency of Social Gatherings with Friends and Family: Twice  a week    Attends Religious Services: Never    Active Member of Clubs or Organizations: No    Attends Engineer, structural: Never    Marital Status: Married    Tobacco Counseling Counseling given: Not Answered   Clinical Intake:  Pre-visit preparation completed: Yes  Pain : No/denies pain Pain Score: 0-No pain     Nutritional Risks: None Diabetes: No  How often do you need to have someone help you when you read instructions, pamphlets, or other written materials from your doctor or pharmacy?: 1 - Never  Diabetic?no  Interpreter Needed?: No  Information entered by :: Kennedy Bucker, LPN   Activities of Daily Living    12/12/2022    9:38 AM 12/08/2022    8:51 AM  In your present state of health, do you have any difficulty performing the following activities:  Hearing? 0 0  Vision? 0 0  Difficulty concentrating or making decisions? 0 0  Walking or climbing stairs? 0 0  Dressing or bathing? 0 0  Doing errands, shopping? 0 0  Preparing Food and eating ? N N  Using the Toilet? N N  In the past six months, have you accidently leaked urine? N N  Do you have problems with loss of bowel control? N N  Managing your Medications? N N  Managing your Finances? N N  Housekeeping or managing your Housekeeping? N N    Patient Care Team: Marjie Skiff, NP as PCP -  General (Nurse Practitioner)  Indicate any recent Medical Services you may have received from other than [facility] providers in the past year (date may be approximate).     Assessment:   This is a routine wellness examination for Chrishawn.  Hearing/Vision screen Hearing Screening - Comments:: No aids Vision Screening - Comments:: No glasses-  Dietary issues and exercise activities discussed: Current Exercise Habits: Home exercise routine, Type of exercise: walking;strength training/weights, Time (Minutes): 40, Frequency (Times/Week): 7, Weekly Exercise (Minutes/Week): 280, Intensity: Mild   Goals Addressed             This Visit's Progress    DIET - EAT MORE FRUITS AND VEGETABLES         Depression Screen    12/12/2022    9:35 AM 10/26/2022   10:06 AM 05/31/2022    9:17 AM 04/05/2022    9:18 AM 10/06/2021   11:06 AM 07/20/2020   11:05 AM 06/30/2019   11:16 AM  PHQ 2/9 Scores  PHQ - 2 Score 0 0 0 0 0 0 0  PHQ- 9 Score 0 0 0 0 0 0 0    Fall Risk    12/12/2022    9:38 AM 12/08/2022    8:51 AM 10/26/2022   10:06 AM 04/05/2022    9:18 AM 07/20/2020   11:05 AM  Fall Risk   Falls in the past year? 0 0 0 0 0  Number falls in past yr: 0  0 0 0  Injury with Fall? 0  0 0 0  Risk for fall due to : No Fall Risks  No Fall Risks No Fall Risks   Follow up Falls prevention discussed;Falls evaluation completed  Falls evaluation completed Falls evaluation completed Falls evaluation completed    FALL RISK PREVENTION PERTAINING TO THE HOME:  Any stairs in or around the home? No  If so, are there any without handrails? No  Home free of loose throw rugs in walkways, pet beds, electrical cords, etc? Yes  Adequate lighting in your home to reduce risk of falls? Yes   ASSISTIVE DEVICES UTILIZED TO PREVENT FALLS:  Life alert? No  Use of a cane, walker or w/c? No  Grab bars in the bathroom? Yes  Shower chair or bench in shower? Yes  Elevated toilet seat or a handicapped toilet? No    Cognitive Function:    05/31/2022    9:42 AM 04/05/2022    9:51 AM  MMSE - Mini Mental State Exam  Orientation to time 5 5  Orientation to Place 5 5  Registration 3 3  Attention/ Calculation 5 5  Recall 3 2  Language- name 2 objects 2 2  Language- repeat 1 1  Language- follow 3 step command 3 3  Language- read & follow direction 1 1  Write a sentence 1 1  Copy design 1 1  Total score 30 29        12/12/2022    9:41 AM  6CIT Screen  What Year? 0 points  What month? 0 points  What time? 3 points  Count back from 20 0 points  Months in reverse 0 points  Repeat phrase 0 points  Total Score 3 points    Immunizations Immunization History  Administered Date(s) Administered   Influenza Inj Mdck Quad Pf 05/09/2019   Influenza, Seasonal, Injecte, Preservative Fre 06/03/2007   Influenza,inj,Quad PF,6+ Mos 05/03/2018, 05/02/2021   Influenza-Unspecified 08/06/2015, 05/31/2016, 05/04/2017, 05/03/2018, 04/29/2019, 04/20/2020, 05/03/2021, 06/23/2022   Moderna Sars-Covid-2 Vaccination 10/30/2019, 11/27/2019, 07/06/2020   Td 10/06/2021   Td (Adult),unspecified 10/06/2021   Tdap 08/14/2010, 10/26/2022    TDAP status: Up to date  Flu Vaccine status: Up to date  Pneumococcal vaccine status: Declined,  Education has been provided regarding the importance of this vaccine but patient still declined. Advised may receive this vaccine at local pharmacy or Health Dept. Aware to provide a copy of the vaccination record if obtained from local pharmacy or Health Dept. Verbalized acceptance and understanding.   Covid-19 vaccine status: Completed vaccines  Qualifies for Shingles Vaccine? Yes   Zostavax completed No   Shingrix Completed?: No.    Education has been provided regarding the importance of this vaccine. Patient has been advised to call insurance company to determine out of pocket expense if they have not yet received this vaccine. Advised may also receive vaccine at local pharmacy  or Health Dept. Verbalized acceptance and understanding.  Screening Tests Health Maintenance  Topic Date Due   Pneumonia Vaccine 109+ Years old (1 of 2 - PCV) Never done   Zoster Vaccines- Shingrix (1 of 2) Never done   COVID-19 Vaccine (4 - 2023-24 season) 04/14/2022   INFLUENZA VACCINE  03/15/2023   Medicare Annual Wellness (AWV)  12/12/2023   COLONOSCOPY (Pts 45-12yrs Insurance coverage will need to be confirmed)  11/10/2024   DTaP/Tdap/Td (5 - Td or Tdap) 10/25/2032   Hepatitis C Screening  Completed   HPV VACCINES  Aged Out    Health Maintenance  Health Maintenance Due  Topic Date Due   Pneumonia Vaccine 46+ Years old (1 of 2 - PCV) Never done   Zoster Vaccines- Shingrix (1 of 2) Never done   COVID-19 Vaccine (4 - 2023-24 season) 04/14/2022    Colorectal cancer screening: Type of screening: Colonoscopy. Completed 11/11/19. Repeat every 5 years  Lung Cancer Screening: (Low Dose CT Chest recommended if Age 41-80 years, 30 pack-year currently smoking OR have quit w/in 15years.) does not qualify.    Additional Screening:  Hepatitis  C Screening: does qualify; Completed 10/06/21  Vision Screening: Recommended annual ophthalmology exams for early detection of glaucoma and other disorders of the eye. Is the patient up to date with their annual eye exam?  No  Who is the provider or what is the name of the office in which the patient attends annual eye exams? No one If pt is not established with a provider, would they like to be referred to a provider to establish care? No .   Dental Screening: Recommended annual dental exams for proper oral hygiene  Community Resource Referral / Chronic Care Management: CRR required this visit?  No   CCM required this visit?  No      Plan:     I have personally reviewed and noted the following in the patient's chart:   Medical and social history Use of alcohol, tobacco or illicit drugs  Current medications and supplements including  opioid prescriptions. Patient is not currently taking opioid prescriptions. Functional ability and status Nutritional status Physical activity Advanced directives List of other physicians Hospitalizations, surgeries, and ER visits in previous 12 months Vitals Screenings to include cognitive, depression, and falls Referrals and appointments  In addition, I have reviewed and discussed with patient certain preventive protocols, quality metrics, and best practice recommendations. A written personalized care plan for preventive services as well as general preventive health recommendations were provided to patient.     Hal Hope, LPN   1/61/0960   Nurse Notes: none

## 2023-04-22 NOTE — Patient Instructions (Signed)
Be Involved in Caring For Your Health:  Taking Medications When medications are taken as directed, they can greatly improve your health. But if they are not taken as prescribed, they may not work. In some cases, not taking them correctly can be harmful. To help ensure your treatment remains effective and safe, understand your medications and how to take them. Bring your medications to each visit for review by your provider.  Your lab results, notes, and after visit summary will be available on My Chart. We strongly encourage you to use this feature. If lab results are abnormal the clinic will contact you with the appropriate steps. If the clinic does not contact you assume the results are satisfactory. You can always view your results on My Chart. If you have questions regarding your health or results, please contact the clinic during office hours. You can also ask questions on My Chart.  We at [facility] are grateful that you chose us to provide your care. We strive to provide evidence-based and compassionate care and are always looking for feedback. If you get a survey from the clinic please complete this so we can hear your opinions.  DASH Eating Plan DASH stands for Dietary Approaches to Stop Hypertension. The DASH eating plan is a healthy eating plan that has been shown to: Lower high blood pressure (hypertension). Reduce your risk for type 2 diabetes, heart disease, and stroke. Help with weight loss. What are tips for following this plan? Reading food labels Check food labels for the amount of salt (sodium) per serving. Choose foods with less than 5 percent of the Daily Value (DV) of sodium. In general, foods with less than 300 milligrams (mg) of sodium per serving fit into this eating plan. To find whole grains, look for the word "whole" as the first word in the ingredient list. Shopping Buy products labeled as "low-sodium" or "no salt added." Buy fresh foods. Avoid canned  foods and pre-made or frozen meals. Cooking Try not to add salt when you cook. Use salt-free seasonings or herbs instead of table salt or sea salt. Check with your health care provider or pharmacist before using salt substitutes. Do not fry foods. Cook foods in healthy ways, such as baking, boiling, grilling, roasting, or broiling. Cook using oils that are good for your heart. These include olive, canola, avocado, soybean, and sunflower oil. Meal planning  Eat a balanced diet. This should include: 4 or more servings of fruits and 4 or more servings of vegetables each day. Try to fill half of your plate with fruits and vegetables. 6-8 servings of whole grains each day. 6 or less servings of lean meat, poultry, or fish each day. 1 oz is 1 serving. A 3 oz (85 g) serving of meat is about the same size as the palm of your hand. One egg is 1 oz (28 g). 2-3 servings of low-fat dairy each day. One serving is 1 cup (237 mL). 1 serving of nuts, seeds, or beans 5 times each week. 2-3 servings of heart-healthy fats. Healthy fats called omega-3 fatty acids are found in foods such as walnuts, flaxseeds, fortified milks, and eggs. These fats are also found in cold-water fish, such as sardines, salmon, and mackerel. Limit how much you eat of: Canned or prepackaged foods. Food that is high in trans fat, such as fried foods. Food that is high in saturated fat, such as fatty meat. Desserts and other sweets, sugary drinks, and other foods with added sugar. Full-fat   dairy products. Do not salt foods before eating. Do not eat more than 4 egg yolks a week. Try to eat at least 2 vegetarian meals a week. Eat more home-cooked food and less restaurant, buffet, and fast food. Lifestyle When eating at a restaurant, ask if your food can be made with less salt or no salt. If you drink alcohol: Limit how much you have to: 0-1 drink a day if you are male. 0-2 drinks a day if you are male. Know how much alcohol is in  your drink. In the [place] one drink is one 12 oz bottle of beer (355 mL), one 5 oz glass of wine (148 mL), or one 1 oz glass of hard liquor (44 mL). General information Avoid eating more than 2,300 mg of salt a day. If you have hypertension, you may need to reduce your sodium intake to 1,500 mg a day. Work with your provider to stay at a healthy body weight or lose weight. Ask what the best weight range is for you. On most days of the week, get at least 30 minutes of exercise that causes your heart to beat faster. This may include walking, swimming, or biking. Work with your provider or dietitian to adjust your eating plan to meet your specific calorie needs. What foods should I eat? Fruits All fresh, dried, or frozen fruit. Canned fruits that are in their natural juice and do not have sugar added to them. Vegetables Fresh or frozen vegetables that are raw, steamed, roasted, or grilled. Low-sodium or reduced-sodium tomato and vegetable juice. Low-sodium or reduced-sodium tomato sauce and tomato paste. Low-sodium or reduced-sodium canned vegetables. Grains Whole-grain or whole-wheat bread. Whole-grain or whole-wheat pasta. Brown rice. Oatmeal. Quinoa. Bulgur. Whole-grain and low-sodium cereals. Pita bread. Low-fat, low-sodium crackers. Whole-wheat flour tortillas. Meats and other proteins Skinless chicken or turkey. Ground chicken or turkey. Pork with fat trimmed off. Fish and seafood. Egg whites. Dried beans, peas, or lentils. Unsalted nuts, nut butters, and seeds. Unsalted canned beans. Lean cuts of beef with fat trimmed off. Low-sodium, lean precooked or cured meat, such as sausages or meat loaves. Dairy Low-fat (1%) or fat-free (skim) milk. Reduced-fat, low-fat, or fat-free cheeses. Nonfat, low-sodium ricotta or cottage cheese. Low-fat or nonfat yogurt. Low-fat, low-sodium cheese. Fats and oils Soft margarine without trans fats. Vegetable oil. Reduced-fat, low-fat, or light mayonnaise and salad  dressings (reduced-sodium). Canola, safflower, olive, avocado, soybean, and sunflower oils. Avocado. Seasonings and condiments Herbs. Spices. Seasoning mixes without salt. Other foods Unsalted popcorn and pretzels. Fat-free sweets. The items listed above may not be all the foods and drinks you can have. Talk to a dietitian to learn more. What foods should I avoid? Fruits Canned fruit in a light or heavy syrup. Fried fruit. Fruit in cream or butter sauce. Vegetables Creamed or fried vegetables. Vegetables in a cheese sauce. Regular canned vegetables that are not marked as low-sodium or reduced-sodium. Regular canned tomato sauce and paste that are not marked as low-sodium or reduced-sodium. Regular tomato and vegetable juices that are not marked as low-sodium or reduced-sodium. Pickles. Olives. Grains Baked goods made with fat, such as croissants, muffins, or some breads. Dry pasta or rice meal packs. Meats and other proteins Fatty cuts of meat. Ribs. Fried meat. Bacon. Bologna, salami, and other precooked or cured meats, such as sausages or meat loaves, that are not lean and low in sodium. Fat from the back of a pig (fatback). Bratwurst. Salted nuts and seeds. Canned beans with added salt. Canned   or smoked fish. Whole eggs or egg yolks. Chicken or turkey with skin. Dairy Whole or 2% milk, cream, and half-and-half. Whole or full-fat cream cheese. Whole-fat or sweetened yogurt. Full-fat cheese. Nondairy creamers. Whipped toppings. Processed cheese and cheese spreads. Fats and oils Butter. Stick margarine. Lard. Shortening. Ghee. Bacon fat. Tropical oils, such as coconut, palm kernel, or palm oil. Seasonings and condiments Onion salt, garlic salt, seasoned salt, table salt, and sea salt. Worcestershire sauce. Tartar sauce. Barbecue sauce. Teriyaki sauce. Soy sauce, including reduced-sodium soy sauce. Steak sauce. Canned and packaged gravies. Fish sauce. Oyster sauce. Cocktail sauce. Store-bought  horseradish. Ketchup. Mustard. Meat flavorings and tenderizers. Bouillon cubes. Hot sauces. Pre-made or packaged marinades. Pre-made or packaged taco seasonings. Relishes. Regular salad dressings. Other foods Salted popcorn and pretzels. The items listed above may not be all the foods and drinks you should avoid. Talk to a dietitian to learn more. Where to find more information National Heart, Lung, and Blood Institute (NHLBI): nhlbi.nih.gov American Heart Association (AHA): heart.org Academy of Nutrition and Dietetics: eatright.org National Kidney Foundation (NKF): kidney.org This information is not intended to replace advice given to you by your health care provider. Make sure you discuss any questions you have with your health care provider. Document Revised: 08/17/2022 Document Reviewed: 08/17/2022 Elsevier Patient Education  2024 Elsevier Inc.  

## 2023-04-25 ENCOUNTER — Ambulatory Visit (INDEPENDENT_AMBULATORY_CARE_PROVIDER_SITE_OTHER): Payer: Medicare Other | Admitting: Nurse Practitioner

## 2023-04-25 ENCOUNTER — Encounter: Payer: Self-pay | Admitting: Nurse Practitioner

## 2023-04-25 VITALS — BP 139/77 | HR 60 | Temp 98.2°F | Ht 69.0 in | Wt 249.0 lb

## 2023-04-25 DIAGNOSIS — R7301 Impaired fasting glucose: Secondary | ICD-10-CM

## 2023-04-25 DIAGNOSIS — I5042 Chronic combined systolic (congestive) and diastolic (congestive) heart failure: Secondary | ICD-10-CM | POA: Diagnosis not present

## 2023-04-25 DIAGNOSIS — J4550 Severe persistent asthma, uncomplicated: Secondary | ICD-10-CM

## 2023-04-25 DIAGNOSIS — R413 Other amnesia: Secondary | ICD-10-CM

## 2023-04-25 DIAGNOSIS — Z23 Encounter for immunization: Secondary | ICD-10-CM

## 2023-04-25 DIAGNOSIS — J709 Respiratory conditions due to unspecified external agent: Secondary | ICD-10-CM

## 2023-04-25 DIAGNOSIS — E6609 Other obesity due to excess calories: Secondary | ICD-10-CM

## 2023-04-25 DIAGNOSIS — E0590 Thyrotoxicosis, unspecified without thyrotoxic crisis or storm: Secondary | ICD-10-CM

## 2023-04-25 DIAGNOSIS — I110 Hypertensive heart disease with heart failure: Secondary | ICD-10-CM

## 2023-04-25 DIAGNOSIS — Z6831 Body mass index (BMI) 31.0-31.9, adult: Secondary | ICD-10-CM

## 2023-04-25 DIAGNOSIS — E782 Mixed hyperlipidemia: Secondary | ICD-10-CM

## 2023-04-25 DIAGNOSIS — D696 Thrombocytopenia, unspecified: Secondary | ICD-10-CM

## 2023-04-25 NOTE — Assessment & Plan Note (Signed)
Waxing and waning of thyroid levels while on Amiodarone -- hypo to hyper levels.  Is off Amiodarone and recent labs stable.  Initiate medication as needed.  Labs today.

## 2023-04-25 NOTE — Assessment & Plan Note (Signed)
Chronic, stable.  BP at goal in office.  Continue current medication regimen and collaboration with cardiology.  Recommend he monitor BP at least a few mornings a week at home and document.  DASH diet at home.  Notes reviewed from cardiology.  LAB:  CMP.

## 2023-04-25 NOTE — Assessment & Plan Note (Signed)
Chronic, ongoing.  Continue Crestor and supplements daily.  Obtain lipid panel today.

## 2023-04-25 NOTE — Assessment & Plan Note (Signed)
Chronic, stable. Euvolemic today.  Reviewed recent cardiology note.  Continue current medication regimen and collaboration with cardiology.  Recommend: - Reminded to call for an overnight weight gain of >2 pounds or a weekly weight weight of >5 pounds - not adding salt to his food and has been reading food labels. Reviewed the importance of keeping daily sodium intake to 2000mg  daily  - Avoid Ibuprofen products

## 2023-04-25 NOTE — Assessment & Plan Note (Signed)
BMI 36.77, lost significant weight over past few years = total of 150 lbs, but has gained some of this back over past months.  Continue keto diet and collaboration with Dr. Danny Lawless. He is maintaining exercise regimen and diet focus.

## 2023-04-25 NOTE — Assessment & Plan Note (Signed)
MMSE 29/30 to 30/30 on checks.  Imaging was reassuring.  Continue current supplements and collaboration with neurology as needed.  Recent notes reviewed.

## 2023-04-25 NOTE — Assessment & Plan Note (Addendum)
Recent labs with normal range glucose, continue to monitor.  Has gained some weight recently, but overall maintaining his exercise and diet regimen.  A1c check today.

## 2023-04-25 NOTE — Assessment & Plan Note (Signed)
Chronic, ongoing,  Followed by pulmonary and allergist.  Continue this collaboration and current medication regimen as prescribed by pulmonary.  Obtain CBC next visit for annual check.

## 2023-04-25 NOTE — Assessment & Plan Note (Signed)
Chronic, ongoing.  Continue current medication regimen and collaboration with pulmonary.  Recent noted reviewed.   

## 2023-04-25 NOTE — Progress Notes (Signed)
BP 139/77   Pulse 60   Temp 98.2 F (36.8 C) (Oral)   Ht 5\' 9"  (1.753 m)   Wt 249 lb (112.9 kg)   SpO2 94%   BMI 36.77 kg/m    Subjective:    Patient ID: Thomas Calhoun, male    DOB: 12-29-1955, 67 y.o.   MRN: [id number]  HPI: Thomas Calhoun is a 67 y.o. male  Chief Complaint  Patient presents with   Asthma   Congestive Heart Failure   Hyperlipidemia   Hypertension   Memory Loss   HYPERTENSION / HYPERLIPIDEMIA & CHF  ICD placed, May 2020, last check 12/27/22.  No CPAP in over 2 years since losing weight.  Follows with cardiology, with last visit 06/27/22, returns on 11/13/2 for visit.  Continues Metoprolol, Losartan + Crestor. Lost > 150 pounds total over past years, worked with Dr. Shanda Howells at [facility], has gained some back. Satisfied with current treatment? yes  Duration of hypertension: chronic  BP monitoring frequency: not checking BP range:  BP medication side effects: no  Duration of hyperlipidemia: chronic   Cholesterol supplements: none  Aspirin: no  Recent stressors: no  Recurrent headaches: no  Visual changes: no  Palpitations: no  Dyspnea: no  Chest pain: no  Lower extremity edema: no  Dizzy/lightheaded: no  The 10-year ASCVD risk score (Arnett DK, et al., 2019) is: 15.5%   Values used to calculate the score:     Age: 59 years     Sex: Male     Is Non-Hispanic African American: No     Diabetic: No     Tobacco smoker: No     Systolic Blood Pressure: 139 mmHg     Is BP treated: Yes     HDL Cholesterol: 74 mg/dL     Total Cholesterol: 216 mg/dL  Impaired Fasting Glucose HbA1C:  Lab Results  Component Value Date   HGBA1C 5.0 10/06/2021  Polydipsia: no Polyuria: no Weight change:  some gain recently Visual disturbance: no Glucose Monitoring: no    Accucheck frequency: Not Checking    Fasting glucose:     Post prandial:  Diabetic Education: Not Completed Family history of diabetes: uncle only, paternal side  THYROID ISSUES &  MEMORY Saw neurology last 06/20/22 for mild cognitive impairment, CT scan in August 2023 no acute findings.  Currently taking B complex. Previously took Amiodarone, but this was discontinued several months back -- thyroid levels were elevated with this. Etiology of hypothyroidism: was on Amiodarone Fatigue: no Cold intolerance: no Heat intolerance: no Weight gain: no Weight loss: no Constipation: no Diarrhea/loose stools: no Palpitations: no Lower extremity edema: no Anxiety/depressed mood: no     05/31/2022    9:42 AM 04/05/2022    9:51 AM  MMSE - Mini Mental State Exam  Orientation to time 5 5  Orientation to Place 5 5  Registration 3 3  Attention/ Calculation 5 5  Recall 3 2  Language- name 2 objects 2 2  Language- repeat 1 1  Language- follow 3 step command 3 3  Language- read & follow direction 1 1  Write a sentence 1 1  Copy design 1 1  Total score 30 29    ASTHMA  Dr. Meredeth Ide, for pulmonary, last on 04/05/23. Continues on Wixela, Albuterol, Claritin, Singulair.  They stopped Nucala, as insurance would not cover anymore.  Followed by ENT (04/20/22) and Allergist at [facility] (07/14/22). Asthma status: stable  Satisfied with current treatment?: yes  Albuterol/rescue  inhaler frequency: occasional recently with pollen Dyspnea frequency: no  Wheezing frequency: occasional Cough frequency: only if pollen increased Nocturnal symptom frequency: no Limitation of activity: no  Current upper respiratory symptoms: no  Triggers: seasonal  Home peak flows: none  Last Spirometry: with pulmonary  Failed/intolerant to following asthma meds: unknown Asthma meds in past: unknown  Aerochamber/spacer use: no  Visits to ER or Urgent Care in past year: no  Pneumovax: Not up to Date  Influenza: Up to Date      04/25/2023   10:01 AM 12/12/2022    9:35 AM 10/26/2022   10:06 AM 05/31/2022    9:17 AM 04/05/2022    9:18 AM  Depression screen PHQ 2/9  Decreased Interest 0 0 0 0 0  Down,  Depressed, Hopeless 0 0 0 0 0  PHQ - 2 Score 0 0 0 0 0  Altered sleeping 0 0 0 0 0  Tired, decreased energy 0 0 0 0 0  Change in appetite 0 0 0 0 0  Feeling bad or failure about yourself  0 0 0 0 0  Trouble concentrating 0 0 0 0 0  Moving slowly or fidgety/restless 0 0 0 0 0  Suicidal thoughts 0 0 0 0 0  PHQ-9 Score 0 0 0 0 0  Difficult doing work/chores Not difficult at all Not difficult at all Not difficult at all Not difficult at all Not difficult at all       04/25/2023   10:01 AM 10/26/2022   10:06 AM 05/31/2022    9:16 AM 04/05/2022    9:19 AM  GAD 7 : Generalized Anxiety Score  Nervous, Anxious, on Edge 0 0 0 0  Control/stop worrying 0 0 0 0  Worry too much - different things 0 0 1 0  Trouble relaxing 0 0 0 0  Restless 0 0 0 0  Easily annoyed or irritable 0 0 0 0  Afraid - awful might happen 0 0 0 0  Total GAD 7 Score 0 0 1 0  Anxiety Difficulty Not difficult at all Not difficult at all Not difficult at all Not difficult at all   Relevant past medical, surgical, family and social history reviewed and updated as indicated. Interim medical history since our last visit reviewed. Allergies and medications reviewed and updated.  Review of Systems  Constitutional:  Negative for activity change, diaphoresis, fatigue and fever.  Respiratory:  Positive for wheezing. Negative for cough, chest tightness and shortness of breath.   Cardiovascular:  Negative for chest pain, palpitations and leg swelling.  Gastrointestinal: Negative.   Neurological: Negative.   Psychiatric/Behavioral: Negative.     Per HPI unless specifically indicated above     Objective:    BP 139/77   Pulse 60   Temp 98.2 F (36.8 C) (Oral)   Ht 5\' 9"  (1.753 m)   Wt 249 lb (112.9 kg)   SpO2 94%   BMI 36.77 kg/m   Wt Readings from Last 3 Encounters:  04/25/23 249 lb (112.9 kg)  12/12/22 237 lb (107.5 kg)  10/26/22 237 lb 8 oz (107.7 kg)    Physical Exam Vitals and nursing note reviewed.   Constitutional:      General: He is awake. He is not in acute distress.    Appearance: He is well-developed and well-groomed. He is obese. He is not ill-appearing or toxic-appearing.  HENT:     Head: Normocephalic and atraumatic.     Right Ear: Hearing, tympanic membrane, ear  canal and external ear normal. No drainage.     Left Ear: Hearing, tympanic membrane, ear canal and external ear normal. No drainage.  Eyes:     General: Lids are normal.        Right eye: No discharge.        Left eye: No discharge.     Conjunctiva/sclera: Conjunctivae normal.     Pupils: Pupils are equal, round, and reactive to light.  Neck:     Thyroid: No thyromegaly.     Vascular: No carotid bruit.     Trachea: Trachea normal.  Cardiovascular:     Rate and Rhythm: Normal rate and regular rhythm.     Heart sounds: Normal heart sounds, S1 normal and S2 normal. No murmur heard.    No gallop.  Pulmonary:     Effort: Pulmonary effort is normal. No accessory muscle usage or respiratory distress.     Breath sounds: Wheezing present. No decreased breath sounds or rhonchi.     Comments: Minimal expiratory wheezing on exam today. Abdominal:     General: Bowel sounds are normal. There is no distension.     Palpations: Abdomen is soft.  Musculoskeletal:        General: Normal range of motion.     Cervical back: Normal range of motion and neck supple.     Right lower leg: No edema.     Left lower leg: No edema.  Lymphadenopathy:     Cervical: No cervical adenopathy.  Skin:    General: Skin is warm and dry.     Capillary Refill: Capillary refill takes less than 2 seconds.  Neurological:     Mental Status: He is alert and oriented to person, place, and time.     Cranial Nerves: Cranial nerves 2-12 are intact.     Deep Tendon Reflexes: Reflexes are normal and symmetric.     Reflex Scores:      Brachioradialis reflexes are 2+ on the right side and 2+ on the left side.      Patellar reflexes are 2+ on the right  side and 2+ on the left side.    Comments: Able to state year, month, day of week, and current Economist.  Psychiatric:        Attention and Perception: Attention normal.        Mood and Affect: Mood normal.        Speech: Speech normal.        Behavior: Behavior normal. Behavior is cooperative.        Thought Content: Thought content normal.    Results for orders placed or performed in visit on 11/27/22  TSH  Result Value Ref Range   TSH 3.950 0.450 - 4.500 uIU/mL  T4, free  Result Value Ref Range   Free T4 1.39 0.82 - 1.77 ng/dL      Assessment & Plan:   Problem List Items Addressed This Visit       Cardiovascular and Mediastinum   Chronic combined systolic and diastolic CHF (congestive heart failure) (HCC)    Chronic, stable. Euvolemic today.  Reviewed recent cardiology note.  Continue current medication regimen and collaboration with cardiology.  Recommend: - Reminded to call for an overnight weight gain of >2 pounds or a weekly weight weight of >5 pounds - not adding salt to his food and has been reading food labels. 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  BP at goal in office.  Continue current medication regimen and collaboration with cardiology.  Recommend he monitor BP at least a few mornings a week at home and document.  DASH diet at home.  Notes reviewed from cardiology.  LAB:  CMP.          Respiratory   Extrinsic allergic respiratory disease (HCC)    Chronic, ongoing,  Followed by pulmonary and allergist.  Continue this collaboration and current medication regimen as prescribed by pulmonary.  Obtain CBC next visit for annual check.      Severe asthma    Chronic, ongoing.  Continue current medication regimen and collaboration with pulmonary.  Recent noted reviewed.      Relevant Medications    fluticasone-salmeterol (WIXELA INHUB) 500-50 MCG/ACT AEPB     Endocrine   Hyperthyroidism    Waxing and waning of thyroid levels while on Amiodarone -- hypo to hyper levels.  Is off Amiodarone and recent labs stable.  Initiate medication as needed.  Labs today.      Relevant Orders   TSH   T4, free   IFG (impaired fasting glucose)    Recent labs with normal range glucose, continue to monitor.  Has gained some weight recently, but overall maintaining his exercise and diet regimen.  A1c check today.      Relevant Orders   HgB A1c     Other   Hyperlipidemia    Chronic, ongoing.  Continue Crestor and supplements daily.  Obtain lipid panel today.      Relevant Orders   Comprehensive metabolic panel   Lipid Panel w/o Chol/HDL Ratio   Memory changes    MMSE 29/30 to 30/30 on checks.  Imaging was reassuring.  Continue current supplements and collaboration with neurology as needed.  Recent notes reviewed.      Obesity    BMI 36.77, lost significant weight over past few years = total of 150 lbs, but has gained some of this back over past months.  Continue keto diet and collaboration with Dr. Danny Lawless. He is maintaining exercise regimen and diet focus.        Follow up plan: Return in about 6 months (around 10/23/2023) for HTN/HLD/HF, IFG, MEMORY, ASTHMA.

## 2023-04-26 ENCOUNTER — Encounter: Payer: Self-pay | Admitting: Nurse Practitioner

## 2023-04-26 LAB — COMPREHENSIVE METABOLIC PANEL
ALT: 39 IU/L (ref 0–44)
AST: 33 IU/L (ref 0–40)
Albumin: 4.6 g/dL (ref 3.9–4.9)
Alkaline Phosphatase: 51 IU/L (ref 44–121)
BUN/Creatinine Ratio: 20 (ref 10–24)
BUN: 20 mg/dL (ref 8–27)
Bilirubin Total: 1.1 mg/dL (ref 0.0–1.2)
CO2: 24 mmol/L (ref 20–29)
Calcium: 9.9 mg/dL (ref 8.6–10.2)
Chloride: 102 mmol/L (ref 96–106)
Creatinine, Ser: 0.99 mg/dL (ref 0.76–1.27)
Globulin, Total: 2.1 g/dL (ref 1.5–4.5)
Glucose: 92 mg/dL (ref 70–99)
Potassium: 4.2 mmol/L (ref 3.5–5.2)
Sodium: 141 mmol/L (ref 134–144)
Total Protein: 6.7 g/dL (ref 6.0–8.5)
eGFR: 84 mL/min/{1.73_m2} (ref >59)

## 2023-04-26 LAB — HEMOGLOBIN A1C
Est. average glucose Bld gHb Est-mCnc: 111 mg/dL
Hgb A1c MFr Bld: 5.5 % (ref 4.8–5.6)

## 2023-04-26 LAB — LIPID PANEL W/O CHOL/HDL RATIO
Cholesterol, Total: 228 mg/dL — ABNORMAL HIGH (ref 100–199)
HDL: 77 mg/dL (ref >39)
LDL Chol Calc (NIH): 124 mg/dL — ABNORMAL HIGH (ref 0–99)
Triglycerides: 155 mg/dL — ABNORMAL HIGH (ref 0–149)
VLDL Cholesterol Cal: 27 mg/dL (ref 5–40)

## 2023-04-26 LAB — T4, FREE: Free T4: 1.46 ng/dL (ref 0.82–1.77)

## 2023-04-26 LAB — TSH: TSH: 3.19 u[IU]/mL (ref 0.450–4.500)

## 2023-04-26 NOTE — Progress Notes (Signed)
Contacted via MyChart   Good afternoon Thomas Calhoun, your labs have returned and overall are stable with exception of cholesterol levels.  I recommend we increase your Rosuvastatin to 20 MG, would you be okay with this?  Let me know.   Keep being excellent!!  Thank you for allowing me to participate in your care.  I appreciate you. Kindest regards, Felipe Paluch

## 2023-05-02 ENCOUNTER — Ambulatory Visit: Payer: Medicare Other

## 2023-05-02 DIAGNOSIS — Z23 Encounter for immunization: Secondary | ICD-10-CM

## 2023-08-15 NOTE — Discharge Summary (Signed)
 [facility]                      Electrophysiology Discharge Summary   Admit Date: 08/12/2023  Discharge Date: 08/16/2023  Admitting Physician: Lavern Vana Smalling, MD  Discharge Physician: Smalling Lavern Vana, MD  Primary Care Provider: Montell Oneil LABOR, MD  Primary Electrophysiologist: Dr Selinda Sermon   Discharge Destination: Home  Discharge Services: none  Code Status: Full Code   Admission Diagnoses:  VT/VF runs  Discharge Diagnoses:  Principal Problem:   VT (ventricular tachycardia) (CMS/HHS-HCC) Active Problems:   Essential hypertension   Hyperlipidemia   Chronic combined systolic and diastolic CHF (congestive heart failure) (CMS/HHS-HCC)   Dual-chamber, Automatic implantable cardioverter-defibrillator in situ   Stage 3a chronic kidney disease (CMS/HHS-HCC)   Hypertensive heart disease with HF (heart failure) (CMS/HHS-HCC)   Neck pain, musculoskeletal Resolved Problems:   * No resolved hospital problems. *     Anticipatory Guidance (key med changes, results pending, future labs, IV therapies): NSVT episodes s/p sotalol  load completion  - successful completion of sotalol  load ( 120mg  every 12 hours) -repeat BMP and Mg during next clinic visit -Mag ox+Kdur prescribed -increased losartan  d/t suboptimal BP trends -First month supply of sotalol  sent to [facility] due to lack of availability at this local pharmacy -Follow-up chest CT in three months is recommended to ensure complete Resolution of pulmonary nodules -Chronic Thrombocytopenia, follow up with PCP for ongoing evaluation    Cardiac Rehab: not indicated for this hospitalization  Patient Discharge Instructions:   Weigh yourself daily and record   Low cholesterol, low fat   Notify cardiology provider of chest pain   Notify provider of swelling in arms, legs, or stomach   Notify provider temperature greater than 101.0 F (38.3  C) degrees   Notify provider of weight gain greater than 2 lbs in 1 day or 5 lbs in 1 week   Report questions or concerns to the [facility] at [phone]   Notify primary care physician of other symptoms   For a life-threatening emergency, call 911   Notify provider of nausea or vomiting   Notify provider of dizziness or passing out   Notify provider of difficulty breathing or shortness of breath   Normal activity   Basic Metabolic Panel  Order Comments: Check potassium and magnesium  levels during next cardiology clinic visit to assess need for further supplementation    [facility] Provider Follow-up: Future Appointments  Date Time Provider Department Center  08/30/2023  2:00 PM Theotis Lavelle BRAVO, MD [facility]  12/11/2023 12:40 PM Sermon Selinda Real, MD ARR CARD 2 [facility]  01/01/2024  9:30 AM [facility] 2K CDU ECHO RM 1 CARD DIAGNOS [facility]  01/01/2024 11:40 AM Erle Marinda Ham, MD 21F/2G CARD [facility]    Non-[facility] Provider Follow-up: none  Report Issues: By using [facility], or by calling the [facility] at [phone].  For urgent issues or after business hours (after 5pm on weekdays and anytime on weekends), call the [facility] Operator [phone] and ask to page the on-call cardiologist.    Allergies/Intolerances:  Allergies  Allergen Reactions  . Amlodipine Besylate Swelling  . Moxifloxacin Hives and Itching    Other reaction(s): Unknown  . Telmisartan  Itching     Medications:     Discharge Medications  New Medications      Details  lidocaine  4 % patch Commonly known as: SALONPAS Start taking on: August 17, 2023  1 patch, Transdermal, Every 24 hours, Apply patch to the most painful area for up to 12 hours in a 24 hours period. Quantity: 30 patch Refills: 0   magnesium  oxide 400 mg (241.3 mg magnesium ) tablet Commonly known as: MAG-OX  400 mg, Oral, 2 times Daily Quantity: 60 tablet Refills: 1   potassium chloride   10 MEQ ER tablet Commonly known as: KLOR-CON   10 mEq, Oral, Daily Quantity: 30 tablet Refills: 1   * sotaloL  120 MG tablet Commonly known as: BETAPACE   120 mg, Oral, Every 12 hours Quantity: 60 tablet Refills: 0   * sotaloL  120 MG tablet Commonly known as: BETAPACE  Start taking on: September 16, 2023  120 mg, Oral, Every 12 hours Quantity: 60 tablet Refills: 6      * * There are duplicate medications prescribed to the patient          Modified Medications      Details  losartan  25 MG tablet Commonly known as: COZAAR  What changed: how much to take  50 mg, Oral, Daily Quantity: 90 tablet Refills: 3       Medications To Continue      Details  albuterol  2.5 mg /3 mL (0.083 %) nebulizer solution Commonly known as: PROVENTIL   Every 6 hours PRN Refills: 0   ascorbic acid  (vitamin C ) 500 MG tablet Commonly known as: VITAMIN C   500 mg, 2 times Daily Refills: 0   azelastine  0.15 % (205.5 mcg) nasal spray Commonly known as: ASTEPRO   USE 1 SPRAY IN EACH NOSTRIL TWICE DAILY Quantity: 30 mL Refills: 11   coenzyme Q10 10 mg capsule  10 mg, Daily Refills: 0   metoprolol  SUCCinate 25 MG XL tablet Commonly known as: TOPROL -XL  25 mg, Oral, Daily Quantity: 90 tablet Refills: 3   MOMETASONE  NASAL  2 times Daily PRN Refills: 0   montelukast  10 mg tablet Commonly known as: SINGULAIR   TAKE 1 TABLET(10 MG) BY MOUTH EVERY DAY Quantity: 90 tablet Refills: 2   TRELEGY ELLIPTA 200-62.5-25 mcg inhaler Generic drug: fluticasone -umeclidinium-vilanterol  1 Puff, Inhalation, Daily Quantity: 1 each Refills: 1   VITAMIN B-12 ORAL  500 mcg, Daily Refills: 0   vitamin E  400 UNIT capsule  400 Units, Daily Refills: 0   zinc gluconate 30 mg Tab  1 tablet, Daily Refills: 0       Stopped Medications    predniSONE  10 MG tablet Commonly known as: DELTASONE           Anticoagulation: Prescribed INR goal: Not applicable   Brief History of Present  Illness: Per the H&P dated on 08/12/2023:  Thomas Calhoun is a 69 y.o. male with PMH significant for NICM, chronic systolic and diastolic HFmrEF 54%, MMVT s/p EPS 2020 (without inducible sustained VT) and s/p secondary prevention BSCI AICD 2020, Asthma, OSA, Obesity, HTN and GERD  who presents from [facility] with jitteriness in the setting of VT requiring ATP.    Patient reports being in usual state of health until 2 days ago. Since Friday, has had a couple episodes of jitteriness. Once on Saturday night. Another this morning. He was not sure what he was doing on Saturday when it happened. Was making breakfast this morning when symptoms started. Per son, patient was also briefly confused and not himself. No other associated sx. Denies chest pain, light-headedness, pre-syncope, dyspnea, fevers, chills, palpitations. Has  been compliant with his medications.    Of note, his first occurrence of VT was May 2020. Symptoms at the time were heaviness and pain in neck and upper back. Current symptoms are not similar to prior. Was started on amiodarone  in May 2020, but weaned off by June 2023. No recent issues with VT, volume overload. Had a sinusitis which has now resolved. Took one dose of prednisone  yesterday for cough (had some from prior Rx), but not on prednisone  chronically.    He was called by remote monitoring rep and told that he had increased burden of VT requiring ATP, which is what prompted him to present to [facility]. Transferred here for HLOC.   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 Presented with multiple nonsustained episodes of MMVT ~200bpm (just below treatment zone). Episode prior to admission accelerated above treatment zone and was successfully terminated with ATP. No electrolyte derangements. Last ischemic work up in 2020 with no obstructive CAD on cath  at the time. Coronary CT with mild non-obstructive CAD. Continued home metoprolol  XL.   Previously on amiodarone  but given age, toxicity of this medication, likely need for long-term, Started on sotalol  120 mg Q12H. Baseline qTc ~ 405 msec. QTc stable at 420 msec after 5th dose.Mild electrolyte derangements, started magnesium  and potassium supplements. No further VT events.Discharged to home in stable condition. EP follow up as mentioned above.  # Chronic systolic and diastolic HFmrEF 54% # NICM # HTN NYHA class I. TTE 12/30 with EF 55%, no WMA, Grade I Diastolic Dysfunction. BB as above. Increased home losartan . Euvolemic. Not on diuretics at home, no indication this admission.   # CKD stage 3a Baseline Cr 0.96. Remained stable    # Neck pain, improving Appears musculoskeletal. Worsen after awakening and improves some throughout the day after moving around more.Treated with ibuprofen, tylenol  and lidocaine  patch. Improved prior to discharge    Imaging and Procedures Performed:   CXR 08/12/2023: Stable cardiomediastinal contours. Similar appearance to left anterior chest wall pacemaker/ICD generator, with 2 intracardiac leads. Stable cardiomediastinal contours. Possible developing ill-defined right upper lobe nodule versus artifact related to overlying scapula. CT recommended to exclude lung nodule. Lungs otherwise unremarkable.   No pleural fluid.  Echo complete 08/13/2023: CONCLUSION ------------------------------------------------------------------------------- NORMAL LEFT VENTRICULAR SYSTOLIC FUNCTION WITH MILD LVH ESTIMATED EF: 55%, CALC EF(2D): 49%, CALC EF(3D): 45% INDETERMINATE DIASTOLIC FUNCTION NORMAL RIGHT VENTRICULAR SYSTOLIC FUNCTION VALVULAR REGURGITATION: TRIVIAL AR, TRIVIAL MR, TRIVIAL PR, TRIVIAL TR NO VALVULAR STENOSIS PHYSICIAN IMPRESSIONS -------------------------------------------------------------------- NORMAL BIVENTRICULAR SIZE AND SYSTOLIC  FUNCTION  CT heart angiogram 08/14/2023: Impression:   1. Multifocal calcific plaque involving the proximal RCA and proximal to mid LAD with stenoses up to 30%. 2. Agatston Calcium  Score of 321 corresponding to the 73rd percentile for patient age and gender. 3. The study is not submitted for CT-FFR analysis.  4. Mildly reduced ejection fraction with quantified LVEF of 47%. 5. Extensive bronchial wall thickening with mucous plugging suggestive of bronchitis. Some discrete pulmonary nodules up to 4 mm in size are noted. Follow-up chest CT in three months is recommended to ensure complete resolution. _____________________  Discharge Exam:  Admission Weight: (!) 115.8 kg (255 lb 4.8 oz)  Discharge Weight: Weight: (!) 120.3 kg (265 lb 4.8 oz) BMI: Body mass index is 38.07 kg/m. BP 126/77 (BP Location: Left upper arm, Patient Position:  Lying)   Pulse 60   Temp 36.4 C (97.5 F) (Oral)   Resp 18   Ht 177.8 cm (5' 10)   Wt (!) 120.3 kg (265 lb 4.8 oz)   SpO2 92%   BMI 38.07 kg/m   General: alert, cooperative, and in NAD Respiratory: regular rate, symmetric, unlabored, clear to auscultation bilaterally, and no accessory muscle use Cardiac: regular rate, regular rhythm, S1, S2 present, no murmur, no rub, no gallop, and JVD non-elevated Chest: device is located in the left infra-clavicular fossa.  There is no erythema, erosion or discharge Abdomen: normal bowel sounds, soft, nontender, and nondistended Extremities: extremities warm and well perfused, no clubbing or cyanosis, no edema, and distal pulses intact  Lines: none  Pertinent Lab Testing:  BMP: Recent Labs  Lab 08/16/23 0432  NA 139  K 3.8  CL 106  CO2 26  BUN 16  CREATININE 0.9  GLUCOSE 86  CALCIUM  9.2  MG 2.0   CBC: Recent Labs  Lab 08/16/23 0432  WBC 8.3  HGB 16.0  HCT 45.1  PLT 91*   Recent Labs  Lab 08/14/23 0424 08/15/23 0513 08/16/23 0432  MG 1.9 2.0 2.0   Recent Labs  Lab 08/14/23 0424  08/15/23 0513 08/16/23 0432  NA 139 140 139  K 3.9 3.8 3.8  CL 107 106 106  CO2 24 24 26   BUN 15 15 16   CREATININE 0.9 0.9 0.9  CALCIUM  9.2 9.3 9.2  GLUCOSE 102 93 86    INR: No results for input(s): INR in the last 168 hours.  TFTs: Recent Labs  Lab 08/13/23 0908  TSH 3.52  T4FREE 0.90        Other Pertinent Labs:  None  _____________________  Time spent on discharge process: >30 minutes  SASMRITA BELBASE, NP   Attending Attestation:  Garritt Molyneux is a 68 y.o. male admitted for treatment of VT.  Hepresented with VT with ATP from his ICD .    He underwent sotalol  load and tolerated it well with stable QTc. Potassium, Magnesium  and Cr remained stable. He had no further VT.  I reviewed the discharge plan above with the patient.  Shlomie Romig expressed an understanding of this discussion and treatment recommendations.  All questions were answered to his satisfaction.  Attestation Statement:   I personally saw the patient and performed a substantive portion of the medical decision making, in conjunction with the Advanced Practice Provider for the condition/treatment of VT.   CAMILLE VANA SMALLING, MD    *Some images could not be shown.

## 2023-08-16 NOTE — Progress Notes (Signed)
   08/16/23 0853  Discharge Plan  Second IMM Provided to patient - in person

## 2023-10-21 NOTE — Patient Instructions (Signed)
 Be Involved in Caring For Your Health:  Taking Medications When medications are taken as directed, they can greatly improve your health. But if they are not taken as prescribed, they may not work. In some cases, not taking them correctly can be harmful. To help ensure your treatment remains effective and safe, understand your medications and how to take them. Bring your medications to each visit for review by your provider.  Your lab results, notes, and after visit summary will be available on My Chart. We strongly encourage you to use this feature. If lab results are abnormal the clinic will contact you with the appropriate steps. If the clinic does not contact you assume the results are satisfactory. You can always view your results on My Chart. If you have questions regarding your health or results, please contact the clinic during office hours. You can also ask questions on My Chart.  We at [facility] are grateful that you chose [place] to provide your care. We strive to provide evidence-based and compassionate care and are always looking for feedback. If you get a survey from the clinic please complete this so we can hear your opinions.  Heart-Healthy Eating Plan Many factors influence your heart health, including eating and exercise habits. Heart health is also called coronary health. Coronary risk increases with abnormal blood fat (lipid) levels. A heart-healthy eating plan includes limiting unhealthy fats, increasing healthy fats, limiting salt (sodium) intake, and making other diet and lifestyle changes. What is my plan? Your health care provider may recommend that: You limit your fat intake to _________% or less of your total calories each day. You limit your saturated fat intake to _________% or less of your total calories each day. You limit the amount of cholesterol in your diet to less than _________ mg per day. You limit the amount of sodium in your diet to less than _________  mg per day. What are tips for following this plan? Cooking Cook foods using methods other than frying. Baking, boiling, grilling, and broiling are all good options. Other ways to reduce fat include: Removing the skin from poultry. Removing all visible fats from meats. Steaming vegetables in water or broth. Meal planning  At meals, imagine dividing your plate into fourths: Fill one-half of your plate with vegetables and green salads. Fill one-fourth of your plate with whole grains. Fill one-fourth of your plate with lean protein foods. Eat 2-4 cups of vegetables per day. One cup of vegetables equals 1 cup (91 g) broccoli or cauliflower florets, 2 medium carrots, 1 large bell pepper, 1 large sweet potato, 1 large tomato, 1 medium white potato, 2 cups (150 g) raw leafy greens. Eat 1-2 cups of fruit per day. One cup of fruit equals 1 small apple, 1 large banana, 1 cup (237 g) mixed fruit, 1 large orange,  cup (82 g) dried fruit, 1 cup (240 mL) 100% fruit juice. Eat more foods that contain soluble fiber. Examples include apples, broccoli, carrots, beans, peas, and barley. Aim to get 25-30 g of fiber per day. Increase your consumption of legumes, nuts, and seeds to 4-5 servings per week. One serving of dried beans or legumes equals  cup (90 g) cooked, 1 serving of nuts is  oz (12 almonds, 24 pistachios, or 7 walnut halves), and 1 serving of seeds equals  oz (8 g). Fats Choose healthy fats more often. Choose monounsaturated and polyunsaturated fats, such as olive and canola oils, avocado oil, flaxseeds, walnuts, almonds, and seeds. Eat  more omega-3 fats. Choose salmon, mackerel, sardines, tuna, flaxseed oil, and ground flaxseeds. Aim to eat fish at least 2 times each week. Check food labels carefully to identify foods with trans fats or high amounts of saturated fat. Limit saturated fats. These are found in animal products, such as meats, butter, and cream. Plant sources of saturated fats  include palm oil, palm kernel oil, and coconut oil. Avoid foods with partially hydrogenated oils in them. These contain trans fats. Examples are stick margarine, some tub margarines, cookies, crackers, and other baked goods. Avoid fried foods. General information Eat more home-cooked food and less restaurant, buffet, and fast food. Limit or avoid alcohol. Limit foods that are high in added sugar and simple starches such as foods made using white refined flour (white breads, pastries, sweets). Lose weight if you are overweight. Losing just 5-10% of your body weight can help your overall health and prevent diseases such as diabetes and heart disease. Monitor your sodium intake, especially if you have high blood pressure. Talk with your health care provider about your sodium intake. Try to incorporate more vegetarian meals weekly. What foods should I eat? Fruits All fresh, canned (in natural juice), or frozen fruits. Vegetables Fresh or frozen vegetables (raw, steamed, roasted, or grilled). Green salads. Grains Most grains. Choose whole wheat and whole grains most of the time. Rice and pasta, including brown rice and pastas made with whole wheat. Meats and other proteins Lean, well-trimmed beef, veal, pork, and lamb. Chicken and [place] without skin. All fish and shellfish. Wild duck, rabbit, pheasant, and venison. Egg whites or low-cholesterol egg substitutes. Dried beans, peas, lentils, and tofu. Seeds and most nuts. Dairy Low-fat or nonfat cheeses, including ricotta and mozzarella. Skim or 1% milk (liquid, powdered, or evaporated). Buttermilk made with low-fat milk. Nonfat or low-fat yogurt. Fats and oils Non-hydrogenated (trans-free) margarines. Vegetable oils, including soybean, sesame, sunflower, olive, avocado, peanut, safflower, corn, canola, and cottonseed. Salad dressings or mayonnaise made with a vegetable oil. Beverages Water (mineral or sparkling). Coffee and tea. Unsweetened ice  tea. Diet beverages. Sweets and desserts Sherbet, gelatin, and fruit ice. Small amounts of dark chocolate. Limit all sweets and desserts. Seasonings and condiments All seasonings and condiments. The items listed above may not be a complete list of foods and beverages you can eat. Contact a dietitian for more options. What foods should I avoid? Fruits Canned fruit in heavy syrup. Fruit in cream or butter sauce. Fried fruit. Limit coconut. Vegetables Vegetables cooked in cheese, cream, or butter sauce. Fried vegetables. Grains Breads made with saturated or trans fats, oils, or whole milk. Croissants. Sweet rolls. Donuts. High-fat crackers, such as cheese crackers and chips. Meats and other proteins Fatty meats, such as hot dogs, ribs, sausage, bacon, rib-eye roast or steak. High-fat deli meats, such as salami and bologna. Caviar. Domestic duck and goose. Organ meats, such as liver. Dairy Cream, sour cream, cream cheese, and creamed cottage cheese. Whole-milk cheeses. Whole or 2% milk (liquid, evaporated, or condensed). Whole buttermilk. Cream sauce or high-fat cheese sauce. Whole-milk yogurt. Fats and oils Meat fat, or shortening. Cocoa butter, hydrogenated oils, palm oil, coconut oil, palm kernel oil. Solid fats and shortenings, including bacon fat, salt pork, lard, and butter. Nondairy cream substitutes. Salad dressings with cheese or sour cream. Beverages Regular sodas and any drinks with added sugar. Sweets and desserts Frosting. Pudding. Cookies. Cakes. Pies. Milk chocolate or white chocolate. Buttered syrups. Full-fat ice cream or ice cream drinks. The items listed above may  not be a complete list of foods and beverages to avoid. Contact a dietitian for more information. Summary Heart-healthy meal planning includes limiting unhealthy fats, increasing healthy fats, limiting salt (sodium) intake and making other diet and lifestyle changes. Lose weight if you are overweight. Losing just  5-10% of your body weight can help your overall health and prevent diseases such as diabetes and heart disease. Focus on eating a balance of foods, including fruits and vegetables, low-fat or nonfat dairy, lean protein, nuts and legumes, whole grains, and heart-healthy oils and fats. This information is not intended to replace advice given to you by your health care provider. Make sure you discuss any questions you have with your health care provider. Document Revised: 09/05/2021 Document Reviewed: 09/05/2021 Elsevier Patient Education  2024 [employer].

## 2023-10-23 ENCOUNTER — Encounter: Payer: Self-pay | Admitting: Nurse Practitioner

## 2023-10-23 ENCOUNTER — Ambulatory Visit (INDEPENDENT_AMBULATORY_CARE_PROVIDER_SITE_OTHER): Payer: Medicare Other | Admitting: Nurse Practitioner

## 2023-10-23 VITALS — BP 117/67 | HR 60 | Temp 98.2°F | Wt 260.8 lb

## 2023-10-23 DIAGNOSIS — G4733 Obstructive sleep apnea (adult) (pediatric): Secondary | ICD-10-CM

## 2023-10-23 DIAGNOSIS — N400 Enlarged prostate without lower urinary tract symptoms: Secondary | ICD-10-CM

## 2023-10-23 DIAGNOSIS — I5042 Chronic combined systolic (congestive) and diastolic (congestive) heart failure: Secondary | ICD-10-CM

## 2023-10-23 DIAGNOSIS — I110 Hypertensive heart disease with heart failure: Secondary | ICD-10-CM | POA: Diagnosis not present

## 2023-10-23 DIAGNOSIS — E6609 Other obesity due to excess calories: Secondary | ICD-10-CM

## 2023-10-23 DIAGNOSIS — J709 Respiratory conditions due to unspecified external agent: Secondary | ICD-10-CM | POA: Diagnosis not present

## 2023-10-23 DIAGNOSIS — D696 Thrombocytopenia, unspecified: Secondary | ICD-10-CM

## 2023-10-23 DIAGNOSIS — E039 Hypothyroidism, unspecified: Secondary | ICD-10-CM

## 2023-10-23 DIAGNOSIS — R7301 Impaired fasting glucose: Secondary | ICD-10-CM

## 2023-10-23 DIAGNOSIS — E782 Mixed hyperlipidemia: Secondary | ICD-10-CM

## 2023-10-23 DIAGNOSIS — E66811 Obesity, class 1: Secondary | ICD-10-CM

## 2023-10-23 DIAGNOSIS — J4550 Severe persistent asthma, uncomplicated: Secondary | ICD-10-CM

## 2023-10-23 DIAGNOSIS — Z95810 Presence of automatic (implantable) cardiac defibrillator: Secondary | ICD-10-CM

## 2023-10-23 LAB — MICROALBUMIN, URINE WAIVED
Creatinine, Urine Waived: 200 mg/dL (ref 10–300)
Microalb, Ur Waived: 10 mg/L (ref 0–19)
Microalb/Creat Ratio: <30 mg/g (ref <30)

## 2023-10-23 NOTE — Assessment & Plan Note (Addendum)
 Chronic, stable.  BP at goal in office.  Continue current medication regimen and collaboration with cardiology.  Recommend he monitor BP at least a few mornings a week at home and document.  DASH diet at home.  Notes reviewed from cardiology.  LAB: CBC, CMP, TSH.

## 2023-10-23 NOTE — Assessment & Plan Note (Signed)
Chronic, ongoing.  Continue Crestor and supplements daily.  Obtain lipid panel today.

## 2023-10-23 NOTE — Progress Notes (Signed)
 BP 117/67   Pulse 60   Temp 98.2 F (36.8 C) (Oral)   Wt 260 lb 12.8 oz (118.3 kg)   BMI 38.51 kg/m    Subjective:    Patient ID: Thomas Calhoun, male    DOB: November 04, 1955, 68 y.o.   MRN: [id number]  HPI: Thomas Calhoun is a 68 y.o. male  Chief Complaint  Patient presents with   Hypertension   Hyperlipidemia   Congestive Heart Failure   Asthma   Prediabetes   HYPERTENSION / HYPERLIPIDEMIA & CHF  Taking Sotalol (also taking Metoprolol, on review of [facility] discharge note from 08/12/23 - appears they started Sotalol and kept Metoprolol), Losartan + Crestor. Taking K+ and magnesium.  Lost > 150 pounds total over past years, worked with Dr. Shanda Howells at [facility].  Has gained some back. Follows with cardiology, last seen 07/03/23. Has history of abnormal TSH while on Amiodarone in past. Platelets low on labs at baseline.  HISTORY: ICD placed, May 2020, last check 09/26/23.  No CPAP in over 2 years since losing weight.  Satisfied with current treatment? yes  Duration of hypertension: chronic  BP monitoring frequency: not checking BP range:  BP medication side effects: no  Duration of hyperlipidemia: chronic   Cholesterol supplements: none  Aspirin: no  Recent stressors: no  Recurrent headaches: no  Visual changes: no  Palpitations: no  Dyspnea: no  Chest pain: no  Lower extremity edema: no  Dizzy/lightheaded: no  The 10-year ASCVD risk score (Arnett DK, et al., 2019) is: 13.2%   Values used to calculate the score:     Age: 44 years     Sex: Male     Is Non-Hispanic African American: No     Diabetic: No     Tobacco smoker: No     Systolic Blood Pressure: 117 mmHg     Is BP treated: Yes     HDL Cholesterol: 62 mg/dL     Total Cholesterol: 226 mg/dL  Impaired Fasting Glucose HbA1C:  Lab Results  Component Value Date   HGBA1C 5.5 04/25/2023  Polydipsia: no Polyuria: no Weight change:  some gain recently Visual disturbance: no Glucose Monitoring: no    Accucheck  frequency: Not Checking    Fasting glucose:     Post prandial:  Diabetic Education: Not Completed Family history of diabetes: uncle only, paternal side  ASTHMA  Takes Wixela, Albuterol, Claritin, Singulair. Saw pulmonary last 08/28/23, want to start him on Fasenra, not covered.  Followed by ENT (08/20/23) and allergist at [facility] (08/24/23). Asthma status: stable  Satisfied with current treatment?: yes  Albuterol/rescue inhaler frequency: at times due to pollen Dyspnea frequency: no  Wheezing frequency: occasional Cough frequency: occasional Nocturnal symptom frequency: no Limitation of activity: no  Current upper respiratory symptoms: no  Triggers: seasonal  Home peak flows: none  Last Spirometry: with pulmonary  Failed/intolerant to following asthma meds: unknown Asthma meds in past: unknown  Aerochamber/spacer use: no  Visits to ER or Urgent Care in past year: no  Pneumovax: Not up to Date  Influenza: Up to Date      10/23/2023    2:42 PM 04/25/2023   10:01 AM 12/12/2022    9:35 AM 10/26/2022   10:06 AM 05/31/2022    9:17 AM  Depression screen PHQ 2/9  Decreased Interest 0 0 0 0 0  Down, Depressed, Hopeless 0 0 0 0 0  PHQ - 2 Score 0 0 0 0 0  Altered sleeping 0  0 0 0 0  Tired, decreased energy 1 0 0 0 0  Change in appetite 0 0 0 0 0  Feeling bad or failure about yourself  0 0 0 0 0  Trouble concentrating 0 0 0 0 0  Moving slowly or fidgety/restless 0 0 0 0 0  Suicidal thoughts 0 0 0 0 0  PHQ-9 Score 1 0 0 0 0  Difficult doing work/chores Not difficult at all Not difficult at all Not difficult at all Not difficult at all Not difficult at all       10/23/2023    2:42 PM 04/25/2023   10:01 AM 10/26/2022   10:06 AM 05/31/2022    9:16 AM  GAD 7 : Generalized Anxiety Score  Nervous, Anxious, on Edge 0 0 0 0  Control/stop worrying 0 0 0 0  Worry too much - different things 0 0 0 1  Trouble relaxing 0 0 0 0  Restless 0 0 0 0  Easily annoyed or irritable 0 0 0 0  Afraid -  awful might happen 1 0 0 0  Total GAD 7 Score 1 0 0 1  Anxiety Difficulty Not difficult at all Not difficult at all Not difficult at all Not difficult at all   Relevant past medical, surgical, family and social history reviewed and updated as indicated. Interim medical history since our last visit reviewed. Allergies and medications reviewed and updated.  Review of Systems  Constitutional:  Negative for activity change, diaphoresis, fatigue and fever.  Respiratory:  Positive for wheezing. Negative for cough, chest tightness and shortness of breath.   Cardiovascular:  Negative for chest pain, palpitations and leg swelling.  Gastrointestinal: Negative.   Neurological: Negative.   Psychiatric/Behavioral: Negative.     Per HPI unless specifically indicated above     Objective:    BP 117/67   Pulse 60   Temp 98.2 F (36.8 C) (Oral)   Wt 260 lb 12.8 oz (118.3 kg)   BMI 38.51 kg/m   Wt Readings from Last 3 Encounters:  10/23/23 260 lb 12.8 oz (118.3 kg)  04/25/23 249 lb (112.9 kg)  12/12/22 237 lb (107.5 kg)    Physical Exam Vitals and nursing note reviewed.  Constitutional:      General: He is awake. He is not in acute distress.    Appearance: He is well-developed and well-groomed. He is obese. He is not ill-appearing or toxic-appearing.  HENT:     Head: Normocephalic.     Right Ear: Hearing and external ear normal.     Left Ear: Hearing and external ear normal.  Eyes:     General: Lids are normal.     Extraocular Movements: Extraocular movements intact.     Conjunctiva/sclera: Conjunctivae normal.  Neck:     Thyroid: No thyromegaly.     Vascular: No carotid bruit.  Cardiovascular:     Rate and Rhythm: Normal rate and regular rhythm.     Heart sounds: Normal heart sounds. No murmur heard.    No gallop.  Pulmonary:     Effort: No accessory muscle usage or respiratory distress.     Breath sounds: Normal breath sounds.  Abdominal:     General: Bowel sounds are normal.  There is no distension.     Palpations: Abdomen is soft.     Tenderness: There is no abdominal tenderness.  Musculoskeletal:     Cervical back: Full passive range of motion without pain.     Right lower leg:  No edema.     Left lower leg: No edema.  Lymphadenopathy:     Cervical: No cervical adenopathy.  Skin:    General: Skin is warm.     Capillary Refill: Capillary refill takes less than 2 seconds.  Neurological:     Mental Status: He is alert and oriented to person, place, and time.     Deep Tendon Reflexes: Reflexes are normal and symmetric.     Reflex Scores:      Brachioradialis reflexes are 2+ on the right side and 2+ on the left side.      Patellar reflexes are 2+ on the right side and 2+ on the left side. Psychiatric:        Attention and Perception: Attention normal.        Mood and Affect: Mood normal.        Speech: Speech normal.        Behavior: Behavior normal. Behavior is cooperative.        Thought Content: Thought content normal.    Results for orders placed or performed in visit on 10/23/23  Microalbumin, Urine Waived   Collection Time: 10/23/23  3:23 PM  Result Value Ref Range   Microalb, Ur Waived 10 0 - 19 mg/L   Creatinine, Urine Waived 200 10 - 300 mg/dL   Microalb/Creat Ratio <30 <30 mg/g      Assessment & Plan:   Problem List Items Addressed This Visit       Cardiovascular and Mediastinum   Chronic combined systolic and diastolic CHF (congestive heart failure) (HCC) - Primary   Chronic, stable. Euvolemic today.  Reviewed recent cardiology note.  Continue current medication regimen and collaboration with cardiology.  Recommend: - Reminded to call for an overnight weight gain of >2 pounds or a weekly weight weight of >5 pounds - not adding salt to his food and has been reading food labels. Reviewed the importance of keeping daily sodium intake to 2000mg  daily  - Avoid Ibuprofen products      Relevant Medications   losartan (COZAAR) 25 MG tablet    sotalol (BETAPACE) 120 MG tablet   Other Relevant Orders   CBC with Differential/Platelet   Comprehensive metabolic panel   Hypertensive heart disease with HF (heart failure) (HCC)   Chronic, stable.  BP at goal in office.  Continue current medication regimen and collaboration with cardiology.  Recommend he monitor BP at least a few mornings a week at home and document.  DASH diet at home.  Notes reviewed from cardiology.  LAB: CBC, CMP, TSH.      Relevant Medications   losartan (COZAAR) 25 MG tablet   sotalol (BETAPACE) 120 MG tablet   Other Relevant Orders   CBC with Differential/Platelet   Comprehensive metabolic panel   TSH     Respiratory   Extrinsic allergic respiratory disease (HCC)   Chronic, ongoing,  Followed by pulmonary and allergist.  Continue this collaboration and current medication regimen as prescribed by pulmonary.  Obtain CBC.      Relevant Orders   CBC with Differential/Platelet   Obstructive sleep apnea   No longer uses CPAP -- has not used in >2 years since weight loss.  May need to restart in future.      Severe asthma   Chronic, ongoing.  Continue current medication regimen and collaboration with pulmonary.  Recent note reviewed.      Relevant Medications   benralizumab (FASENRA PEN) 30 MG/ML prefilled autoinjector   TRELEGY  ELLIPTA 200-62.5-25 MCG/ACT AEPB   Other Relevant Orders   CBC with Differential/Platelet     Endocrine   IFG (impaired fasting glucose)   Recent labs with normal range glucose, continue to monitor.  Has gained some weight recently, but overall maintaining his exercise and diet regimen.  A1c check today.      Relevant Orders   Microalbumin, Urine Waived (Completed)   HgB A1c     Hematopoietic and Hemostatic   Thrombocytopenia (HCC)   Ongoing, stable.  Noted in past on labs, recheck CBC today and monitor closely.      Relevant Orders   CBC with Differential/Platelet     Other   Hyperlipidemia   Chronic, ongoing.   Continue Crestor and supplements daily.  Obtain lipid panel today.      Relevant Medications   losartan (COZAAR) 25 MG tablet   sotalol (BETAPACE) 120 MG tablet   Other Relevant Orders   Comprehensive metabolic panel   Lipid Panel w/o Chol/HDL Ratio   ICD (implantable cardioverter-defibrillator) in place   Continue collaboration with [facility] at [facility].      Obesity   BMI 38.51, lost significant weight over past few years = total of 150 lbs, but has gained some of this back over past months.  Continue keto diet and collaboration with Dr. Danny Lawless. He is maintaining exercise regimen and diet focus.      Relevant Medications   magnesium oxide (MAG-OX) 400 MG tablet   Other Visit Diagnoses       Hypothyroidism, unspecified type       History of abnormal TSH, recheck today.   Relevant Medications   sotalol (BETAPACE) 120 MG tablet   Other Relevant Orders   TSH     Benign prostatic hyperplasia without lower urinary tract symptoms       PSA on labs today.   Relevant Orders   PSA        Follow up plan: Return in about 6 months (around 04/24/2024) for HTN/HLD, ASTHMA, HF, IFG.

## 2023-10-23 NOTE — Assessment & Plan Note (Signed)
 Chronic, ongoing,  Followed by pulmonary and allergist.  Continue this collaboration and current medication regimen as prescribed by pulmonary.  Obtain CBC.

## 2023-10-23 NOTE — Assessment & Plan Note (Signed)
Recent labs with normal range glucose, continue to monitor.  Has gained some weight recently, but overall maintaining his exercise and diet regimen.  A1c check today.

## 2023-10-23 NOTE — Assessment & Plan Note (Signed)
Ongoing, stable.  Noted in past on labs, recheck CBC today and monitor closely.

## 2023-10-23 NOTE — Assessment & Plan Note (Signed)
 No longer uses CPAP -- has not used in >2 years since weight loss.  May need to restart in future.

## 2023-10-23 NOTE — Assessment & Plan Note (Signed)
Continue collaboration with cardiology team at [facility]. 

## 2023-10-23 NOTE — Assessment & Plan Note (Signed)
 BMI 38.51, lost significant weight over past few years = total of 150 lbs, but has gained some of this back over past months.  Continue keto diet and collaboration with Dr. Danny Lawless. He is maintaining exercise regimen and diet focus.

## 2023-10-23 NOTE — Assessment & Plan Note (Signed)
Chronic, stable. Euvolemic today.  Reviewed recent cardiology note.  Continue current medication regimen and collaboration with cardiology.  Recommend: - Reminded to call for an overnight weight gain of >2 pounds or a weekly weight weight of >5 pounds - not adding salt to his food and has been reading food labels. Reviewed the importance of keeping daily sodium intake to 2000mg  daily  - Avoid Ibuprofen products

## 2023-10-23 NOTE — Assessment & Plan Note (Addendum)
 Chronic, ongoing.  Continue current medication regimen and collaboration with pulmonary.  Recent note reviewed.

## 2023-10-24 ENCOUNTER — Encounter: Payer: Self-pay | Admitting: Nurse Practitioner

## 2023-10-24 LAB — CBC WITH DIFFERENTIAL/PLATELET
Basophils Absolute: 0 10*3/uL (ref 0.0–0.2)
Basos: 1 %
EOS (ABSOLUTE): 0.7 10*3/uL — ABNORMAL HIGH (ref 0.0–0.4)
Eos: 9 %
Hematocrit: 49.7 % (ref 37.5–51.0)
Hemoglobin: 17.3 g/dL (ref 13.0–17.7)
Immature Grans (Abs): 0 10*3/uL (ref 0.0–0.1)
Immature Granulocytes: 0 %
Lymphocytes Absolute: 1.3 10*3/uL (ref 0.7–3.1)
Lymphs: 17 %
MCH: 35.8 pg — ABNORMAL HIGH (ref 26.6–33.0)
MCHC: 34.8 g/dL (ref 31.5–35.7)
MCV: 103 fL — ABNORMAL HIGH (ref 79–97)
Monocytes Absolute: 0.7 10*3/uL (ref 0.1–0.9)
Monocytes: 9 %
Neutrophils Absolute: 4.7 10*3/uL (ref 1.4–7.0)
Neutrophils: 64 %
Platelets: 92 10*3/uL — CL (ref 150–450)
RBC: 4.83 x10E6/uL (ref 4.14–5.80)
RDW: 12.4 % (ref 11.6–15.4)
WBC: 7.3 10*3/uL (ref 3.4–10.8)

## 2023-10-24 LAB — PSA: Prostate Specific Ag, Serum: 0.6 ng/mL (ref 0.0–4.0)

## 2023-10-24 LAB — COMPREHENSIVE METABOLIC PANEL
ALT: 34 IU/L (ref 0–44)
AST: 31 IU/L (ref 0–40)
Albumin: 4.6 g/dL (ref 3.9–4.9)
Alkaline Phosphatase: 61 IU/L (ref 44–121)
BUN/Creatinine Ratio: 20 (ref 10–24)
BUN: 20 mg/dL (ref 8–27)
Bilirubin Total: 0.9 mg/dL (ref 0.0–1.2)
CO2: 22 mmol/L (ref 20–29)
Calcium: 9.6 mg/dL (ref 8.6–10.2)
Chloride: 103 mmol/L (ref 96–106)
Creatinine, Ser: 0.98 mg/dL (ref 0.76–1.27)
Globulin, Total: 2.4 g/dL (ref 1.5–4.5)
Glucose: 96 mg/dL (ref 70–99)
Potassium: 4.4 mmol/L (ref 3.5–5.2)
Sodium: 142 mmol/L (ref 134–144)
Total Protein: 7 g/dL (ref 6.0–8.5)
eGFR: 85 mL/min/{1.73_m2} (ref >59)

## 2023-10-24 LAB — LIPID PANEL W/O CHOL/HDL RATIO
Cholesterol, Total: 202 mg/dL — ABNORMAL HIGH (ref 100–199)
HDL: 63 mg/dL (ref >39)
LDL Chol Calc (NIH): 105 mg/dL — ABNORMAL HIGH (ref 0–99)
Triglycerides: 198 mg/dL — ABNORMAL HIGH (ref 0–149)
VLDL Cholesterol Cal: 34 mg/dL (ref 5–40)

## 2023-10-24 LAB — HEMOGLOBIN A1C
Est. average glucose Bld gHb Est-mCnc: 108 mg/dL
Hgb A1c MFr Bld: 5.4 % (ref 4.8–5.6)

## 2023-10-24 LAB — TSH: TSH: 3.29 u[IU]/mL (ref 0.450–4.500)

## 2023-10-24 NOTE — Progress Notes (Signed)
 Contacted via MyChart   Good afternoon Thomas Calhoun, your labs are slowly returning: - CBC continues to show mild elevation in MCV and MCH + lower platelets.  We will continue to monitor these closely and ensure to get plenty of red meat in diet . - Your lipid panel continues to show elevations in LDL, it is coming down but would like to see <70.  You would benefit from increasing Rosuvastatin to 20 MG.  Would you be okay with this? - Kidney function, creatinine and eGFR, remains normal, as is liver function, AST and ALT.  - A1c shows no diabetes or prediabetes.  - Waiting on remainder of labs and will let you know if any abnormal levels. Keep being amazing!!  Thank you for allowing me to participate in your care.  I appreciate you. Kindest regards, Missi Mcmackin

## 2023-12-18 ENCOUNTER — Ambulatory Visit: Payer: Self-pay

## 2023-12-18 VITALS — Ht 70.0 in | Wt 260.0 lb

## 2023-12-18 DIAGNOSIS — Z0000 Encounter for general adult medical examination without abnormal findings: Secondary | ICD-10-CM

## 2023-12-18 NOTE — Progress Notes (Signed)
 Subjective:   Thomas Calhoun is a 68 y.o. who presents for a Medicare Wellness preventive visit.  Visit Complete: Virtual I connected with  Thomas Calhoun on 12/18/23 by a audio enabled telemedicine application and verified that I am speaking with the correct person using two identifiers.  Patient Location: Home  Provider Location: Home Office  I discussed the limitations of evaluation and management by telemedicine. The patient expressed understanding and agreed to proceed.  Vital Signs: Because this visit was a virtual/telehealth visit, some criteria may be missing or patient reported. Any vitals not documented were not able to be obtained and vitals that have been documented are patient reported.  VideoDeclined- This patient declined Librarian, academic. Therefore the visit was completed with audio only.  Persons Participating in Visit: Patient.  AWV Questionnaire: No: Patient Medicare AWV questionnaire was not completed prior to this visit.  Cardiac Risk Factors include: advanced age (>59men, >87 women);male gender;hypertension;dyslipidemia;obesity (BMI >30kg/m2)     Objective:    Today's Vitals   12/18/23 0801  Weight: 260 lb (117.9 kg)  Height: 5\' 10"  (1.778 m)   Body mass index is 37.31 kg/m.     12/18/2023    8:14 AM 12/12/2022    9:37 AM 12/27/2018    9:01 AM 12/27/2018    5:44 AM 07/18/2016    7:32 AM  Advanced Directives  Does Patient Have a Medical Advance Directive? No No Yes Yes No  Type of Advance Directive   Living will;Healthcare Power of Attorney    Does patient want to make changes to medical advance directive?   No - Patient declined No - Patient declined   Copy of Healthcare Power of Attorney in Chart?   No - copy requested    Would patient like information on creating a medical advance directive? Yes (MAU/Ambulatory/Procedural Areas - Information given) No - Patient declined       Current Medications  (verified) Outpatient Encounter Medications as of 12/18/2023  Medication Sig   albuterol  (PROVENTIL  HFA;VENTOLIN  HFA) 108 (90 BASE) MCG/ACT inhaler Inhale 2 puffs into the lungs every 6 (six) hours as needed for wheezing or shortness of breath.   albuterol  (PROVENTIL ) (2.5 MG/3ML) 0.083% nebulizer solution Take 2.5 mg by nebulization every 6 (six) hours as needed for wheezing or shortness of breath.    azelastine  (ASTELIN ) 0.1 % nasal spray Place 2 sprays into both nostrils 2 (two) times daily. Use in each nostril as directed   KRILL OIL PO Take 1 capsule by mouth daily.   loratadine (CLARITIN) 10 MG tablet Take 10 mg by mouth daily.   losartan  (COZAAR ) 25 MG tablet Take 50 mg by mouth daily.   magnesium  oxide (MAG-OX) 400 MG tablet Take 400 mg by mouth 2 (two) times daily.   metoprolol succinate (TOPROL-XL) 25 MG 24 hr tablet Take 25 mg by mouth daily.   mometasone  (ASMANEX ) 220 MCG/INH inhaler Inhale 2 puffs into the lungs daily. Nasal wash   montelukast  (SINGULAIR ) 10 MG tablet Take 10 mg by mouth at bedtime.    potassium chloride (KLOR-CON) 10 MEQ tablet Take 10 mEq by mouth daily.   sotalol (BETAPACE) 120 MG tablet Take 120 mg by mouth 2 (two) times daily.   TRELEGY ELLIPTA 200-62.5-25 MCG/ACT AEPB Inhale 1 puff into the lungs daily.   vitamin C  (ASCORBIC ACID ) 500 MG tablet Take 500 mg by mouth daily.   vitamin E  400 UNIT capsule Take 400 Units by mouth daily.  Zinc Gluconate 30 MG TABS Take by mouth.   benralizumab (FASENRA PEN) 30 MG/ML prefilled autoinjector Inject 30 mg into the skin every 8 (eight) weeks. (Patient not taking: Reported on 12/18/2023)   fluticasone -salmeterol (WIXELA INHUB) 500-50 MCG/ACT AEPB Inhale 1 puff into the lungs in the morning and at bedtime. (Patient not taking: Reported on 12/18/2023)   rosuvastatin (CRESTOR) 10 MG tablet Take 10 mg by mouth at bedtime. (Patient not taking: Reported on 12/18/2023)   No facility-administered encounter medications on file as of  12/18/2023.    Allergies (verified) Amlodipine besylate, Moxifloxacin, Avelox [moxifloxacin hcl in nacl], and Micardis  [telmisartan ]   History: Past Medical History:  Diagnosis Date   Abnormal serum enzyme level    AICD (automatic cardioverter/defibrillator) present    Asthma    Hyperlipidemia    Hypertension    IFG (impaired fasting glucose)    Meralgia paresthetica    Testicular hypofunction    Past Surgical History:  Procedure Laterality Date   APPENDECTOMY     COLONOSCOPY WITH PROPOFOL  N/A 07/18/2016   Procedure: COLONOSCOPY WITH PROPOFOL ;  Surgeon: Marnee Sink, MD;  Location: [facility] ENDOSCOPY;  Service: Endoscopy;  Laterality: N/A;   COLONOSCOPY WITH PROPOFOL  N/A 11/11/2019   Procedure: COLONOSCOPY WITH PROPOFOL ;  Surgeon: Marnee Sink, MD;  Location: [facility] ENDOSCOPY;  Service: Endoscopy;  Laterality: N/A;   INNER EAR SURGERY Left 2018   repaird ear infection in the ear drum   LEFT HEART CATH AND CORONARY ANGIOGRAPHY N/A 12/27/2018   Procedure: LEFT HEART CATH AND CORONARY ANGIOGRAPHY possible PCI and stent;  Surgeon: Antonette Batters, MD;  Location: [facility] INVASIVE CV LAB;  Service: Cardiovascular;  Laterality: N/A;   NASAL SINUS SURGERY     SMALL INTESTINE SURGERY     Family History  Problem Relation Age of Onset   Heart disease Mother    Hypertension Mother    Cancer Father        melanoma   Stroke Father        x2   Cancer Sister    Hypertension Son    ADD / ADHD Son    Social History   Socioeconomic History   Marital status: Married    Spouse name: Mary   Number of children: 2   Years of education: Not on file   Highest education level: Associate degree: occupational, Scientist, product/process development, or vocational program  Occupational History   Not on file  Tobacco Use   Smoking status: Never    Passive exposure: Past   Smokeless tobacco: Never  Vaping Use   Vaping status: Never Used  Substance and Sexual Activity   Alcohol use: Not Currently   Drug use: No   Sexual  activity: Never  Other Topics Concern   Not on file  Social History Narrative   12/18/23 works part-time 20 hours per week   Social Drivers of Corporate investment banker Strain: Low Risk  (12/18/2023)   Overall Financial Resource Strain (CARDIA)    Difficulty of Paying Living Expenses: Not hard at all  Food Insecurity: No Food Insecurity (12/18/2023)   Hunger Vital Sign    Worried About Running Out of Food in the Last Year: Never true    Ran Out of Food in the Last Year: Never true  Transportation Needs: No Transportation Needs (12/18/2023)   PRAPARE - Administrator, Civil Service (Medical): No    Lack of Transportation (Non-Medical): No  Physical Activity: Sufficiently Active (12/18/2023)   Exercise Vital Sign  Days of Exercise per Week: 5 days    Minutes of Exercise per Session: 30 min  Recent Concern: Physical Activity - Insufficiently Active (10/21/2023)   Exercise Vital Sign    Days of Exercise per Week: 3 days    Minutes of Exercise per Session: 30 min  Stress: No Stress Concern Present (12/18/2023)   [employer] of Occupational Health - Occupational Stress Questionnaire    Feeling of Stress : Not at all  Social Connections: Moderately Isolated (12/18/2023)   Social Connection and Isolation Panel [NHANES]    Frequency of Communication with Friends and Family: More than three times a week    Frequency of Social Gatherings with Friends and Family: More than three times a week    Attends Religious Services: Never    Database administrator or Organizations: No    Attends Engineer, structural: Never    Marital Status: Married    Tobacco Counseling Counseling given: Not Answered    Clinical Intake:  Pre-visit preparation completed: Yes  Pain : No/denies pain     BMI - recorded: 37.31 Nutritional Status: BMI > 30  Obese Nutritional Risks: None Diabetes: No  Lab Results  Component Value Date   HGBA1C 5.4 10/23/2023   HGBA1C 5.5 04/25/2023    HGBA1C 5.0 10/06/2021     How often do you need to have someone help you when you read instructions, pamphlets, or other written materials from your doctor or pharmacy?: 1 - Never  Interpreter Needed?: No  Information entered by :: Jaunita Messier, CMA   Activities of Daily Living     12/18/2023    8:04 AM  In your present state of health, do you have any difficulty performing the following activities:  Hearing? 1  Comment has seen ENT in the last year  Vision? 0  Difficulty concentrating or making decisions? 0  Walking or climbing stairs? 0  Dressing or bathing? 0  Doing errands, shopping? 0  Preparing Food and eating ? N  Using the Toilet? N  In the past six months, have you accidently leaked urine? N  Do you have problems with loss of bowel control? N  Managing your Medications? N  Managing your Finances? N  Housekeeping or managing your Housekeeping? N    Patient Care Team: Cannady, Jolene T, NP as PCP - General (Nurse Practitioner)  Indicate any recent Medical Services you may have received from other than [facility] providers in the past year (date may be approximate).     Assessment:   This is a routine wellness examination for Herchell.  Hearing/Vision screen Hearing Screening - Comments:: Hearing loss, has had hearing evaluation in the last year. Vision Screening - Comments:: Needs routine eye exams, Dr. Violet Grew [place]. Patient will call to schedule   Goals Addressed               This Visit's Progress     Increase physical activity (pt-stated)         Depression Screen     12/18/2023    8:12 AM 10/23/2023    2:42 PM 04/25/2023   10:01 AM 12/12/2022    9:35 AM 10/26/2022   10:06 AM 05/31/2022    9:17 AM 04/05/2022    9:18 AM  PHQ 2/9 Scores  PHQ - 2 Score 0 0 0 0 0 0 0  PHQ- 9 Score 1 1 0 0 0 0 0    Fall Risk     12/18/2023  8:17 AM 10/23/2023    2:41 PM 04/25/2023    9:59 AM 12/12/2022    9:38 AM 12/08/2022    8:51 AM  Fall Risk   Falls in the past  year? 0 0 0 0 0  Number falls in past yr: 0 0 0 0   Injury with Fall? 0 0 0 0   Risk for fall due to : No Fall Risks No Fall Risks No Fall Risks No Fall Risks   Follow up Falls prevention discussed;Falls evaluation completed Falls evaluation completed Falls evaluation completed Falls prevention discussed;Falls evaluation completed     MEDICARE RISK AT HOME:  Medicare Risk at Home Any stairs in or around the home?: Yes If so, are there any without handrails?: No Home free of loose throw rugs in walkways, pet beds, electrical cords, etc?: Yes Adequate lighting in your home to reduce risk of falls?: Yes Life alert?: No Use of a cane, walker or w/c?: No Grab bars in the bathroom?: Yes Shower chair or bench in shower?: Yes Elevated toilet seat or a handicapped toilet?: No  TIMED UP AND GO:  Was the test performed?  No  Cognitive Function: 6CIT completed    05/31/2022    9:42 AM 04/05/2022    9:51 AM  MMSE - Mini Mental State Exam  Orientation to time 5 5  Orientation to Place 5 5  Registration 3 3  Attention/ Calculation 5 5  Recall 3 2  Language- name 2 objects 2 2  Language- repeat 1 1  Language- follow 3 step command 3 3  Language- read & follow direction 1 1  Write a sentence 1 1  Copy design 1 1  Total score 30 29        12/18/2023    8:18 AM 12/12/2022    9:41 AM  6CIT Screen  What Year? 0 points 0 points  What month? 0 points 0 points  What time? 0 points 3 points  Count back from 20 0 points 0 points  Months in reverse 0 points 0 points  Repeat phrase 0 points 0 points  Total Score 0 points 3 points    Immunizations Immunization History  Administered Date(s) Administered   Fluad Trivalent(High Dose 65+) 05/02/2023   Influenza Inj Mdck Quad Pf 05/09/2019, 06/23/2022   Influenza, Seasonal, Injecte, Preservative Fre 06/03/2007   Influenza,inj,Quad PF,6+ Mos 05/03/2018, 05/02/2021   Influenza-Unspecified 08/06/2015, 05/31/2016, 05/04/2017, 05/03/2018,  04/29/2019, 04/20/2020, 05/03/2021, 06/23/2022, 05/31/2023   Moderna Sars-Covid-2 Vaccination 10/30/2019, 11/27/2019, 07/06/2020   Td 10/06/2021   Td (Adult),unspecified 10/06/2021   Tdap 08/14/2010, 10/26/2022    Screening Tests Health Maintenance  Topic Date Due   COVID-19 Vaccine (4 - 2024-25 season) 04/15/2023   Zoster Vaccines- Shingrix (1 of 2) 01/21/2024 (Originally 12/02/1974)   Pneumonia Vaccine 34+ Years old (1 of 2 - PCV) 04/24/2024 (Originally 12/02/1974)   INFLUENZA VACCINE  03/14/2024   Colonoscopy  11/10/2024   Medicare Annual Wellness (AWV)  12/17/2024   DTaP/Tdap/Td (5 - Td or Tdap) 10/25/2032   Hepatitis C Screening  Completed   HPV VACCINES  Aged Out   Meningococcal B Vaccine  Aged Out    Health Maintenance  Health Maintenance Due  Topic Date Due   COVID-19 Vaccine (4 - 2024-25 season) 04/15/2023   Health Maintenance Items Addressed: See Nurse Notes  Additional Screening:  Vision Screening: Recommended annual ophthalmology exams for early detection of glaucoma and other disorders of the eye.  Dental Screening: Recommended annual dental exams  for proper oral hygiene  Community Resource Referral / Chronic Care Management: CRR required this visit?  No   CCM required this visit?  No     Plan:     I have personally reviewed and noted the following in the patient's chart:   Medical and social history Use of alcohol, tobacco or illicit drugs  Current medications and supplements including opioid prescriptions. Patient is not currently taking opioid prescriptions. Functional ability and status Nutritional status Physical activity Advanced directives List of other physicians Hospitalizations, surgeries, and ER visits in previous 12 months Vitals Screenings to include cognitive, depression, and falls Referrals and appointments  In addition, I have reviewed and discussed with patient certain preventive protocols, quality metrics, and best practice  recommendations. A written personalized care plan for preventive services as well as general preventive health recommendations were provided to patient.     Jaunita Messier, CMA   12/18/2023   After Visit Summary: (MyChart) Due to this being a telephonic visit, the after visit summary with patients personalized plan was offered to patient via MyChart   Notes: Please refer to Routing Comments.

## 2023-12-18 NOTE — Patient Instructions (Addendum)
 Mr. Thomas Calhoun , Thank you for taking time to come for your Medicare Wellness Visit. I appreciate your ongoing commitment to your health goals. Please review the following plan we discussed and let me know if I can assist you in the future.   Referrals/Orders/Follow-Ups/Clinician Recommendations: Call Dr. Blanca Bunch office to schedule a routine eye exam. We recommend an eye exam every 1-2 years. Get the shingles vaccines at your local pharmacy. Get the pneumonia vaccine at your next appointment on 04/28/24.  This is a list of the screening recommended for you and due dates:  Health Maintenance  Topic Date Due   COVID-19 Vaccine (4 - 2024-25 season) 04/15/2023   Zoster (Shingles) Vaccine (1 of 2) 01/21/2024*   Pneumonia Vaccine (1 of 2 - PCV) 04/24/2024*   Flu Shot  03/14/2024   Colon Cancer Screening  11/10/2024   Medicare Annual Wellness Visit  12/17/2024   DTaP/Tdap/Td vaccine (5 - Td or Tdap) 10/25/2032   Hepatitis C Screening  Completed   HPV Vaccine  Aged Out   Meningitis B Vaccine  Aged Out  *Topic was postponed. The date shown is not the original due date.    Advanced directives: (ACP Link)Information on Advanced Care Planning can be found at [place]  Secretary of [facility] Advance Health Care Directives Advance Health Care Directives. [email or web address] You may also get these forms at your doctor's office.  Once you have completed the forms, please bring a copy of your health care power of attorney and living will to the office to be added to your chart at your convenience.   Next Medicare Annual Wellness Visit scheduled for next year: Yes, 12/30/24 @ 8:00am (phone visit)  Have you seen your provider in the last 6 months (3 months if uncontrolled diabetes)? Yes

## 2024-02-07 ENCOUNTER — Other Ambulatory Visit: Payer: Self-pay | Admitting: Specialist

## 2024-02-07 DIAGNOSIS — R053 Chronic cough: Secondary | ICD-10-CM

## 2024-02-07 DIAGNOSIS — R911 Solitary pulmonary nodule: Secondary | ICD-10-CM

## 2024-02-22 ENCOUNTER — Ambulatory Visit
Admission: RE | Admit: 2024-02-22 | Discharge: 2024-02-22 | Disposition: A | Discharge status: Home / Self Care | Source: Ambulatory Visit | Attending: Specialist | Admitting: Specialist

## 2024-02-22 DIAGNOSIS — R053 Chronic cough: Secondary | ICD-10-CM | POA: Insufficient documentation

## 2024-02-22 DIAGNOSIS — R911 Solitary pulmonary nodule: Secondary | ICD-10-CM | POA: Insufficient documentation

## 2024-03-18 ENCOUNTER — Observation Stay

## 2024-03-18 ENCOUNTER — Emergency Department

## 2024-03-18 ENCOUNTER — Other Ambulatory Visit: Payer: Self-pay

## 2024-03-18 ENCOUNTER — Telehealth: Payer: Self-pay

## 2024-03-18 ENCOUNTER — Observation Stay
Admission: EM | Admit: 2024-03-18 | Discharge: 2024-03-19 | Disposition: A | Discharge status: Home / Self Care | Attending: Internal Medicine | Admitting: Internal Medicine

## 2024-03-18 DIAGNOSIS — Z79899 Other long term (current) drug therapy: Secondary | ICD-10-CM | POA: Insufficient documentation

## 2024-03-18 DIAGNOSIS — G459 Transient cerebral ischemic attack, unspecified: Principal | ICD-10-CM | POA: Diagnosis present

## 2024-03-18 DIAGNOSIS — Z6830 Body mass index (BMI) 30.0-30.9, adult: Secondary | ICD-10-CM | POA: Insufficient documentation

## 2024-03-18 DIAGNOSIS — J45909 Unspecified asthma, uncomplicated: Secondary | ICD-10-CM | POA: Insufficient documentation

## 2024-03-18 DIAGNOSIS — R202 Paresthesia of skin: Secondary | ICD-10-CM | POA: Diagnosis present

## 2024-03-18 DIAGNOSIS — I10 Essential (primary) hypertension: Secondary | ICD-10-CM

## 2024-03-18 DIAGNOSIS — E669 Obesity, unspecified: Secondary | ICD-10-CM | POA: Diagnosis present

## 2024-03-18 DIAGNOSIS — Z7902 Long term (current) use of antithrombotics/antiplatelets: Secondary | ICD-10-CM | POA: Diagnosis not present

## 2024-03-18 DIAGNOSIS — Z8709 Personal history of other diseases of the respiratory system: Secondary | ICD-10-CM

## 2024-03-18 DIAGNOSIS — N1831 Chronic kidney disease, stage 3a: Secondary | ICD-10-CM | POA: Insufficient documentation

## 2024-03-18 DIAGNOSIS — I5042 Chronic combined systolic (congestive) and diastolic (congestive) heart failure: Secondary | ICD-10-CM | POA: Diagnosis not present

## 2024-03-18 DIAGNOSIS — Z95810 Presence of automatic (implantable) cardiac defibrillator: Secondary | ICD-10-CM | POA: Insufficient documentation

## 2024-03-18 DIAGNOSIS — D696 Thrombocytopenia, unspecified: Secondary | ICD-10-CM | POA: Diagnosis not present

## 2024-03-18 DIAGNOSIS — Z8679 Personal history of other diseases of the circulatory system: Secondary | ICD-10-CM | POA: Insufficient documentation

## 2024-03-18 LAB — LIPID PANEL
Cholesterol: 211 mg/dL — ABNORMAL HIGH (ref 0–200)
HDL: 65 mg/dL (ref >40)
LDL Cholesterol: 118 mg/dL — ABNORMAL HIGH (ref 0–99)
Total CHOL/HDL Ratio: 3.2 ratio
Triglycerides: 140 mg/dL (ref <150)
VLDL: 28 mg/dL (ref 0–40)

## 2024-03-18 LAB — COMPREHENSIVE METABOLIC PANEL WITH GFR
ALT: 46 U/L — ABNORMAL HIGH (ref 0–44)
AST: 38 U/L (ref 15–41)
Albumin: 4.4 g/dL (ref 3.5–5.0)
Alkaline Phosphatase: 48 U/L (ref 38–126)
Anion gap: 7 (ref 5–15)
BUN: 26 mg/dL — ABNORMAL HIGH (ref 8–23)
CO2: 30 mmol/L (ref 22–32)
Calcium: 9.4 mg/dL (ref 8.9–10.3)
Chloride: 101 mmol/L (ref 98–111)
Creatinine, Ser: 0.74 mg/dL (ref 0.61–1.24)
GFR, Estimated: >60 mL/min (ref >60)
Glucose, Bld: 109 mg/dL — ABNORMAL HIGH (ref 70–99)
Potassium: 4.1 mmol/L (ref 3.5–5.1)
Sodium: 138 mmol/L (ref 135–145)
Total Bilirubin: 1.8 mg/dL — ABNORMAL HIGH (ref 0.0–1.2)
Total Protein: 7.5 g/dL (ref 6.5–8.1)

## 2024-03-18 LAB — CBC
HCT: 47.5 % (ref 39.0–52.0)
Hemoglobin: 16.6 g/dL (ref 13.0–17.0)
MCH: 34.9 pg — ABNORMAL HIGH (ref 26.0–34.0)
MCHC: 34.9 g/dL (ref 30.0–36.0)
MCV: 99.8 fL (ref 80.0–100.0)
Platelets: 103 K/uL — ABNORMAL LOW (ref 150–400)
RBC: 4.76 MIL/uL (ref 4.22–5.81)
RDW: 11.9 % (ref 11.5–15.5)
WBC: 5.6 K/uL (ref 4.0–10.5)
nRBC: 0 % (ref 0.0–0.2)

## 2024-03-18 LAB — DIFFERENTIAL
Abs Immature Granulocytes: 0.04 K/uL (ref 0.00–0.07)
Basophils Absolute: 0 K/uL (ref 0.0–0.1)
Basophils Relative: 0 %
Eosinophils Absolute: 0 K/uL (ref 0.0–0.5)
Eosinophils Relative: 0 %
Immature Granulocytes: 1 %
Lymphocytes Relative: 21 %
Lymphs Abs: 1.2 K/uL (ref 0.7–4.0)
Monocytes Absolute: 0.7 K/uL (ref 0.1–1.0)
Monocytes Relative: 13 %
Neutro Abs: 3.7 K/uL (ref 1.7–7.7)
Neutrophils Relative %: 65 %

## 2024-03-18 LAB — GLUCOSE, CAPILLARY: Glucose-Capillary: 126 mg/dL — ABNORMAL HIGH (ref 70–99)

## 2024-03-18 MED ORDER — IOHEXOL 350 MG/ML SOLN
75.0000 mL | Freq: Once | INTRAVENOUS | Status: AC | PRN
  Administered 2024-03-18: 75 mL via INTRAVENOUS

## 2024-03-18 MED ORDER — CLOPIDOGREL BISULFATE 75 MG PO TABS
300.0000 mg | ORAL_TABLET | Freq: Once | ORAL | Status: AC
  Administered 2024-03-18: 300 mg via ORAL
  Filled 2024-03-18: qty 4

## 2024-03-18 MED ORDER — ROSUVASTATIN CALCIUM 10 MG PO TABS
10.0000 mg | ORAL_TABLET | Freq: Every day | ORAL | Status: DC
  Filled 2024-03-18: qty 1

## 2024-03-18 MED ORDER — LORATADINE 10 MG PO TABS
10.0000 mg | ORAL_TABLET | Freq: Every day | ORAL | Status: DC
  Administered 2024-03-19: 10 mg via ORAL
  Filled 2024-03-18: qty 1

## 2024-03-18 MED ORDER — PANTOPRAZOLE SODIUM 40 MG IV SOLR
40.0000 mg | Freq: Every day | INTRAVENOUS | Status: DC
  Administered 2024-03-18: 40 mg via INTRAVENOUS
  Filled 2024-03-18: qty 10

## 2024-03-18 MED ORDER — ONDANSETRON HCL 4 MG/2ML IJ SOLN: 4.0000 mg | Freq: Four times a day (QID) | INTRAMUSCULAR | Status: DC | PRN

## 2024-03-18 MED ORDER — ACETAMINOPHEN 650 MG RE SUPP: 650.0000 mg | RECTAL | Status: DC | PRN

## 2024-03-18 MED ORDER — ACETAMINOPHEN 325 MG PO TABS: 650.0000 mg | ORAL_TABLET | ORAL | Status: DC | PRN

## 2024-03-18 MED ORDER — BUDESON-GLYCOPYRROL-FORMOTEROL 160-9-4.8 MCG/ACT IN AERO
2.0000 | INHALATION_SPRAY | Freq: Two times a day (BID) | RESPIRATORY_TRACT | Status: DC
  Administered 2024-03-18 – 2024-03-19 (×2): 2 via RESPIRATORY_TRACT
  Filled 2024-03-18: qty 5.9

## 2024-03-18 MED ORDER — SODIUM CHLORIDE 0.9 % IV SOLN: INTRAVENOUS | Status: DC

## 2024-03-18 MED ORDER — ACETAMINOPHEN 160 MG/5ML PO SOLN: 650.0000 mg | ORAL | Status: DC | PRN

## 2024-03-18 MED ORDER — ASPIRIN 81 MG PO TBEC
81.0000 mg | DELAYED_RELEASE_TABLET | Freq: Every day | ORAL | Status: DC
  Administered 2024-03-18 – 2024-03-19 (×2): 81 mg via ORAL
  Filled 2024-03-18 (×2): qty 1

## 2024-03-18 MED ORDER — MELATONIN 5 MG PO TABS: 5.0000 mg | ORAL_TABLET | Freq: Every evening | ORAL | Status: DC | PRN

## 2024-03-18 MED ORDER — MAGNESIUM OXIDE 400 MG PO TABS
400.0000 mg | ORAL_TABLET | Freq: Two times a day (BID) | ORAL | Status: DC
  Administered 2024-03-18 – 2024-03-19 (×2): 400 mg via ORAL
  Filled 2024-03-18 (×4): qty 1

## 2024-03-18 MED ORDER — LOSARTAN POTASSIUM 50 MG PO TABS
50.0000 mg | ORAL_TABLET | Freq: Every day | ORAL | Status: DC
Start: 2024-03-19 — End: 2024-03-19
  Administered 2024-03-19: 50 mg via ORAL
  Filled 2024-03-18: qty 1

## 2024-03-18 MED ORDER — SENNOSIDES-DOCUSATE SODIUM 8.6-50 MG PO TABS: 1.0000 | ORAL_TABLET | Freq: Every evening | ORAL | Status: DC | PRN

## 2024-03-18 MED ORDER — SOTALOL HCL 80 MG PO TABS
120.0000 mg | ORAL_TABLET | Freq: Two times a day (BID) | ORAL | Status: DC
  Administered 2024-03-18 – 2024-03-19 (×2): 120 mg via ORAL
  Filled 2024-03-18 (×2): qty 1.5

## 2024-03-18 MED ORDER — ALBUTEROL SULFATE (2.5 MG/3ML) 0.083% IN NEBU: 2.5000 mg | INHALATION_SOLUTION | Freq: Four times a day (QID) | RESPIRATORY_TRACT | Status: DC | PRN

## 2024-03-18 MED ORDER — STROKE: EARLY STAGES OF RECOVERY BOOK: Freq: Once | Status: AC

## 2024-03-18 MED ORDER — METOPROLOL SUCCINATE ER 25 MG PO TB24: 25.0000 mg | ORAL_TABLET | Freq: Every day | ORAL | Status: DC

## 2024-03-18 MED ORDER — CLOPIDOGREL BISULFATE 75 MG PO TABS
75.0000 mg | ORAL_TABLET | Freq: Every day | ORAL | Status: DC
  Administered 2024-03-19: 75 mg via ORAL
  Filled 2024-03-18: qty 1

## 2024-03-18 MED ORDER — MONTELUKAST SODIUM 10 MG PO TABS: 10.0000 mg | ORAL_TABLET | Freq: Every day | ORAL | Status: DC

## 2024-03-18 NOTE — Progress Notes (Signed)
 At 2130 a code stroke was called due to facial tingling over most of face, no other changes voiced by patient. Dr Cleatus in room.   Patient taken to CT scan.  Tele neurologist used to assess patient.  ICU charge nurse also in room. Patient stated that the tingling was gone when he was returned to his room.

## 2024-03-18 NOTE — Progress Notes (Signed)
 End of Shift:  Patient had a second CT with contrast this shift. Patient has not had any more stroke like symptoms and denies those that he felt before coming up to floor. Patient was not scoring on the NIHSS and seems to be doing well. Family at bedside. No concerns at this time.

## 2024-03-18 NOTE — Progress Notes (Signed)
 PT Cancellation Note  Patient Details Name: Thomas Calhoun MRN: [id number] DOB: [birth year]   Cancelled Treatment:    Reason Eval/Treat Not Completed: PT screened, no needs identified, will sign off. Orders received and chart reviewed. Upon entry to room pt standing with NSG placing telemetry unit. Pt is independent with all functional mobility ambulating ~200', asc/desc stairs with single rail use and has normal proprioception, coordination, and RAMPS testing bilat. Pt reports being at baseline with functional mobility. Pt with no acute PT deficits. Signing off in house.    Dorina HERO. Fairly IV, PT, DPT Physical Therapist- [place]  [facility] 03/18/2024, 4:09 PM

## 2024-03-18 NOTE — Progress Notes (Signed)
   SIGNIFICANT EVENT---CODE STROKE    CROSS COVER NOTE  NAME: Thomas Calhoun MRN: [id number] DOB : August 30, 1955    Concern as stated by nurse / staff   Code stroke announced overhead     Pertinent findings on chart review: Patient admitted for TIA.   Patient Assessment  Started developing left-sided facial tingling with subsequently resolved by the time of arrival of rapid response team NIHSS 0, blood sugar 126    03/18/2024   10:10 PM 03/18/2024    9:38 PM 03/18/2024    7:05 PM  Vitals with BMI  Systolic 142 164 867  Diastolic 88 88 82  Pulse 59 60 59   Physical Exam Vitals and nursing note reviewed.  Constitutional:      General: He is not in acute distress. HENT:     Head: Normocephalic and atraumatic.  Cardiovascular:     Rate and Rhythm: Normal rate and regular rhythm.     Heart sounds: Normal heart sounds.  Pulmonary:     Effort: Pulmonary effort is normal.     Breath sounds: Normal breath sounds.  Abdominal:     Palpations: Abdomen is soft.     Tenderness: There is no abdominal tenderness.  Neurological:     General: No focal deficit present.     Mental Status: Mental status is at baseline.      Assessment and  Interventions   Assessment:  Continue code stroke activation  Plan: CT head negative--discussed with [facility] radiology Discussed with teleneurology: Given NIH 0, negative CTA head and neck earlier, no need to repeat CTA head and neck.  Can get MRI in the a.m.(patient is ICD status) Teleneurology advised to load with Plavix  300 with 75 mg daily along with aspirin  for DAPT therapy (has thrombocytopenia but count of 100,000 so okay to continue with Plavix ) Continue other management      CRITICAL CARE Performed by: Delayne LULLA Solian   Total critical care time: 60 minutes  Critical care time was exclusive of separately billable procedures and treating other patients.  Critical care was necessary to treat or prevent imminent or life-threatening  deterioration.  Critical care was time spent personally by me on the following activities: development of treatment plan with patient and/or surrogate as well as nursing, discussions with consultants, evaluation of patient's response to treatment, examination of patient, obtaining history from patient or surrogate, ordering and performing treatments and interventions, ordering and review of laboratory studies, ordering and review of radiographic studies, pulse oximetry and re-evaluation of patient's condition.

## 2024-03-18 NOTE — H&P (Addendum)
 History and Physical    OLON RUSS FMW:[id number] DOB: 12-18-1955 DOA: 03/18/2024  DOS: the patient was seen and examined on 03/18/2024  PCP: Valerio Melanie DASEN, NP   Patient coming from: Home  I have personally briefly reviewed patient's old medical records in [facility] and [facility]where  HPI:   KAEDAN RICHERT is a 68 y.o. year old male with past medical history of asthma, hypertension, hyperlipidemia, OSA, CKD stage IIIa, chronic thrombocytopenia, nonischemic cardiomyopathy, chronic systolic and diastolic and HFrEF, VT s/p AICD, GERD.  He presents to [facility] ED with reports of paresthesias described as a pins and needle sensation of his right face, ear, foot that lasted for about an hour before resolving yesterday morning. He felt normal when he went to bed. Around 830 this morning had recurrent episode of paresthesias over his right side including his face and leg.  He presented to his PCP for evaluation and was directed to come to ED for concerns of CVA.  He denies headache, vision change, aphasia, dizziness, weakness.  Patient is concerned that symptoms may be caused by a new medication he is taking Fasenra or artificial sweetener and propel water. I explained to him that neither Fasenra or artificial sweeteners have been linked to increased incidence of strokelike symptoms.  ED Course: On arrival to [facility] regional ED patient was noted to be afebrile temp 37 C, BP 170/96, HR 58, RR 20, SpO2 94% on room air.  CT head without obtained and shows no acute intracranial abnormality.  Labs notable for platelets 103, BUN 26, ALT 46, T. bili 1.8.  Lipid panel with total cholesterol 211 and LDL 118. TRH contacted for admission.  Review of Systems: As mentioned in the history of present illness. All other systems reviewed and are negative.  Review of Systems  Constitutional:  Negative for chills and fever.  HENT:  Positive for hearing loss. Negative for congestion and  tinnitus.   Eyes:  Negative for blurred vision.  Respiratory:  Positive for cough.   Cardiovascular:  Negative for chest pain and palpitations.  Gastrointestinal:  Negative for abdominal pain, constipation, diarrhea, heartburn, nausea and vomiting.  Neurological:  Positive for tingling and sensory change. Negative for dizziness, speech change, focal weakness, weakness and headaches.    Past Medical History:  Diagnosis Date   Abnormal serum enzyme level    AICD (automatic cardioverter/defibrillator) present    Asthma    Hyperlipidemia    Hypertension    IFG (impaired fasting glucose)    Meralgia paresthetica    Testicular hypofunction     Past Surgical History:  Procedure Laterality Date   APPENDECTOMY     COLONOSCOPY WITH PROPOFOL  N/A 07/18/2016   Procedure: COLONOSCOPY WITH PROPOFOL ;  Surgeon: Rogelia Copping, MD;  Location: [facility] ENDOSCOPY;  Service: Endoscopy;  Laterality: N/A;   COLONOSCOPY WITH PROPOFOL  N/A 11/11/2019   Procedure: COLONOSCOPY WITH PROPOFOL ;  Surgeon: Copping Rogelia, MD;  Location: [facility] ENDOSCOPY;  Service: Endoscopy;  Laterality: N/A;   INNER EAR SURGERY Left 2018   repaird ear infection in the ear drum   LEFT HEART CATH AND CORONARY ANGIOGRAPHY N/A 12/27/2018   Procedure: LEFT HEART CATH AND CORONARY ANGIOGRAPHY possible PCI and stent;  Surgeon: Florencio Cara BIRCH, MD;  Location: [facility] INVASIVE CV LAB;  Service: Cardiovascular;  Laterality: N/A;   NASAL SINUS SURGERY     SMALL INTESTINE SURGERY       reports that he has never smoked. He has been exposed to tobacco  smoke. He has never used smokeless tobacco. He reports that he does not currently use alcohol. He reports that he does not use drugs.  Allergies  Allergen Reactions   Amlodipine Besylate Swelling   Moxifloxacin Hives and Itching    Other reaction(s): Unknown   Avelox [Moxifloxacin Hcl In Nacl] Hives and Itching   Micardis  [Telmisartan ] Itching    Family History  Problem Relation Age of Onset    Heart disease Mother    Hypertension Mother    Cancer Father        melanoma   Stroke Father        x2   Cancer Sister    Hypertension Son    ADD / ADHD Son     Prior to Admission medications   Medication Sig Start Date End Date Taking? Authorizing Provider  doxycycline (VIBRAMYCIN) 100 MG capsule Take 100 mg by mouth 2 (two) times daily. 02/29/24  Yes [provider]  albuterol  (PROVENTIL  HFA;VENTOLIN  HFA) 108 (90 BASE) MCG/ACT inhaler Inhale 2 puffs into the lungs every 6 (six) hours as needed for wheezing or shortness of breath.    [provider]  albuterol  (PROVENTIL ) (2.5 MG/3ML) 0.083% nebulizer solution Take 2.5 mg by nebulization every 6 (six) hours as needed for wheezing or shortness of breath.  03/09/16   [provider]  azelastine  (ASTELIN ) 0.1 % nasal spray Place 2 sprays into both nostrils 2 (two) times daily. Use in each nostril as directed 10/06/21   Cannady, Jolene T, NP  benralizumab (FASENRA PEN) 30 MG/ML prefilled autoinjector Inject 30 mg into the skin every 8 (eight) weeks. Patient not taking: Reported on 12/18/2023 08/29/23 08/28/24  [provider]  fluticasone -salmeterol (WIXELA INHUB) 500-50 MCG/ACT AEPB Inhale 1 puff into the lungs in the morning and at bedtime. Patient not taking: Reported on 12/18/2023    [provider]  KRILL OIL PO Take 1 capsule by mouth daily.    [provider]  loratadine  (CLARITIN ) 10 MG tablet Take 10 mg by mouth daily.    [provider]  losartan  (COZAAR ) 25 MG tablet Take 50 mg by mouth daily. 08/16/23   [provider]  magnesium  oxide (MAG-OX) 400 MG tablet Take 400 mg by mouth 2 (two) times daily. 08/16/23 10/14/24  [provider]  metoprolol  succinate (TOPROL -XL) 25 MG 24 hr tablet Take 25 mg by mouth daily.    [provider]  mometasone  (ASMANEX ) 220 MCG/INH inhaler Inhale 2 puffs into the lungs daily. Nasal wash    [provider]   montelukast  (SINGULAIR ) 10 MG tablet Take 10 mg by mouth at bedtime.  01/19/15   [provider]  potassium chloride  (KLOR-CON ) 10 MEQ tablet Take 10 mEq by mouth daily. 08/16/23 10/14/24  [provider]  rosuvastatin  (CRESTOR ) 10 MG tablet Take 10 mg by mouth at bedtime. Patient not taking: Reported on 12/18/2023 01/12/21   [provider]  sotalol  (BETAPACE ) 120 MG tablet Take 120 mg by mouth 2 (two) times daily. 10/16/23   [provider]  DOMINIC BECK 200-62.5-25 MCG/ACT AEPB Inhale 1 puff into the lungs daily. 09/08/23   [provider]  vitamin C  (ASCORBIC ACID ) 500 MG tablet Take 500 mg by mouth daily.    [provider]  vitamin E  400 UNIT capsule Take 400 Units by mouth daily.    [provider]  Zinc Gluconate 30 MG TABS Take by mouth.    [provider]    Physical Exam:  Vitals:   03/18/24 1225 03/18/24 1245 03/18/24 1248 03/18/24 1340  BP:      Pulse: (!) 58 60 (!) 59 71  Resp: 18 (!) 22 16 (!) 21  Temp:      TempSrc:      SpO2: 93% 95% 97% 97%  Weight:      Height:        Physical Exam Vitals and nursing note reviewed.  Constitutional:      Appearance: Normal appearance.  HENT:     Head: Normocephalic.  Eyes:     Pupils: Pupils are equal, round, and reactive to light.  Cardiovascular:     Rate and Rhythm: Normal rate and regular rhythm.     Pulses: Normal pulses.     Heart sounds: No murmur heard.    No friction rub. No gallop.  Pulmonary:     Effort: Pulmonary effort is normal. No respiratory distress.     Breath sounds: Normal breath sounds. No wheezing, rhonchi or rales.  Abdominal:     General: Abdomen is flat. Bowel sounds are normal. There is no distension.     Palpations: Abdomen is soft. There is no mass.     Tenderness: There is no abdominal tenderness.  Musculoskeletal:        General: Normal range of motion.     Cervical back: Normal range of motion. No tenderness.  Skin:     General: Skin is warm and dry.     Capillary Refill: Capillary refill takes less than 2 seconds.  Neurological:     Mental Status: He is alert and oriented to person, place, and time.     Sensory: No sensory deficit.     Motor: No weakness.     Comments: (R) face and (R)LE numbness/tingling resolved at time of my exam     Labs on Admission: I have personally reviewed following labs and imaging studies  CBC: Recent Labs  Lab 03/18/24 1109  WBC 5.6  NEUTROABS 3.7  HGB 16.6  HCT 47.5  MCV 99.8  PLT 103*   Basic Metabolic Panel: Recent Labs  Lab 03/18/24 1109  NA 138  K 4.1  CL 101  CO2 30  GLUCOSE 109*  BUN 26*  CREATININE 0.74  CALCIUM  9.4   GFR: Estimated Creatinine Clearance: 117.6 mL/min (by C-G formula based on SCr of 0.74 mg/dL). Liver Function Tests: Recent Labs  Lab 03/18/24 1109  AST 38  ALT 46*  ALKPHOS 48  BILITOT 1.8*  PROT 7.5  ALBUMIN 4.4   No results for input(s): LIPASE, AMYLASE in the last 168 hours. No results for input(s): AMMONIA in the last 168 hours. Coagulation Profile: No results for input(s): INR, PROTIME in the last 168 hours. Cardiac Enzymes: No results for input(s): CKTOTAL, CKMB, CKMBINDEX, TROPONINI, TROPONINIHS in the last 168 hours. BNP (last 3 results) No results for input(s): BNP in the last 8760 hours. HbA1C: No results for input(s): HGBA1C in the last 72 hours. CBG: No results for input(s): GLUCAP in the last 168 hours. Lipid Profile: No results for input(s): CHOL, HDL, LDLCALC, TRIG, CHOLHDL, LDLDIRECT in the last 72 hours. Thyroid  Function Tests: No results for input(s): TSH, T4TOTAL, FREET4, T3FREE, THYROIDAB in the last 72 hours. Anemia Panel: No results for input(s): VITAMINB12, FOLATE, FERRITIN, TIBC, IRON, RETICCTPCT in the last 72 hours. Urine analysis:    Component Value Date/Time   COLORURINE Straw 06/08/2013 1606   APPEARANCEUR Clear 06/08/2013  1606   LABSPEC 1.010 06/08/2013 1606  PHURINE 7.0 06/08/2013 1606   GLUCOSEU Negative 06/08/2013 1606   HGBUR Negative 06/08/2013 1606   BILIRUBINUR Negative 06/08/2013 1606   KETONESUR Negative 06/08/2013 1606   PROTEINUR Negative 06/08/2013 1606   NITRITE Negative 06/08/2013 1606   LEUKOCYTESUR Negative 06/08/2013 1606    Radiological Exams on Admission: I have personally reviewed images CT HEAD WO CONTRAST Result Date: 03/18/2024 EXAM: CT HEAD WITHOUT CONTRAST 03/18/2024 12:03:27 PM TECHNIQUE: CT of the head was performed without the administration of intravenous contrast. Automated exposure control, iterative reconstruction, and/or weight based adjustment of the mA/kV was utilized to reduce the radiation dose to as low as reasonably achievable. COMPARISON: CT of the head dated 04/13/2022. CLINICAL HISTORY: Numbness or tingling, paresthesia (Ped 0-17y). Pt c/o intermittent RLE numbness and numbness around mouth, R ear, and R eye starting today. Denies pain. Pt reports having numbness in RLE yesterday which resolved and returned this morning. LKW 0830. Speech is clear, face is symmetric, and denies weakness. FINDINGS: BRAIN AND VENTRICLES: No acute hemorrhage. Gray-white differentiation is preserved. No hydrocephalus. No extra-axial collection. No mass effect or midline shift. Mild-to-moderate periventricular and deep cerebral white matter disease present. ORBITS: No acute abnormality. SINUSES: The patient is status post bilateral sinonasal surgery. There is near complete opacification of the frontal sinuses and right ethmoid air cells. There is disease within the right maxillary and sphenoid sinuses. SOFT TISSUES AND SKULL: No acute soft tissue abnormality. No skull fracture. IMPRESSION: 1. No acute intracranial abnormality related to the reported numbness and tingling. 2. Mild-to-moderate periventricular and deep cerebral white matter disease. 3. Status post bilateral sinonasal surgery with near  complete opacification of the frontal sinuses and right ethmoid air cells. Disease within the right maxillary and sphenoid sinuses. Electronically signed by: evalene coho 03/18/2024 12:40 PM EDT RP Workstation: [id number]    EKG: My personal interpretation of EKG shows: A paced heart rate 60    Assessment/Plan Principal Problem:   TIA (transient ischemic attack)   Active medical issues: ##Transient ischemic attack - ASA 81 mg daily  - Check A1C and LDL - Resume Crestor  at previous dosing of 10 mg daily - q4H neuro checks - CT head negative, MRI brain ordered - STAT head CT for any change in neuro exam - Monitor on telemetry - PT/OT/SLP  ##Neck pain Pain noted in January 2025 [facility] inpatient admission notes - CT C-spine - As needed analgesia  Chronic medical issues: #Hypertension  -Continue home losartan   #Hyperlipidemia - Resume Crestor  at previous dosing of 10 mg daily  #OSA Not on home CPAP -PRN at bedtime supplemental O2  #Nonischemic cardiomyopathy,  #Chronic systolic and diastolic and HFrEF - Continue home losartan    #VT s/p AICD  - Continue home sotalol   #Chronic kidney disease stage IIIa Stable, baseline creatinine 0.9-1.0  #GERD - Protonix   #Chronic thrombocytopenia Noted, no signs of bleeding/bruising on exam  VTE prophylaxis: SCDs GI prophylaxis: Protonix  Access: PIV Lines: None Code Status:  Full Code Telemetry: Yes  Admission status: Observation, Telemetry bed Patient is from: Home Anticipated d/c is to: Home Anticipated d/c date is: 1-2 days  Family Communication: No family at bedside, plan of care discussed with patient.  Questions were welcomed and elicited.  Questions answered to patient's expressed satisfaction.    Consults called: N/A    Severity of Illness: The appropriate patient status for this patient is OBSERVATION. Observation status is judged to be reasonable and necessary in order to provide the required  intensity of service to ensure  the patient's safety. The patient's presenting symptoms, physical exam findings, and initial radiographic and laboratory data in the context of their medical condition is felt to place them at decreased risk for further clinical deterioration. Furthermore, it is anticipated that the patient will be medically stable for discharge from the hospital within 2 midnights of admission.   To reach the provider On-Call:   7AM- 7PM see care teams to locate the attending and reach out to them via [email or web address] Password: [email or web address] 7PM-7AM contact night-coverage If you still have difficulty reaching the appropriate provider, please page the [facility] (Director on Call) for [facility] on amion for assistance  This document was prepared using Conservation officer, historic buildings and may include unintentional dictation errors.  Rockie Rams FNP-BC, PMHNP-BC Nurse Practitioner [facility]  Patient seen and examined.  Patient had 2 episodes of numbness of the right face and right leg.  Admitted for TIA workup. Spoke with MRI department patient does have an AICD [facility] number [id number] and serial [id number], lead I model [id number], serial [id number], lead 2 model [id number] with serial [id number].  This AICD is potentially MRI compatible.  MRI department will speak with cardiology and [facility] and see if we can get this done tomorrow morning. Will get CT angio of the head and neck and echocardiogram.  Physical exam: HEENT: Normocephalic, neck good range of motion neck: Cardiovascular system S1-S2 normal no gallops rubs or murmurs, lungs clear to auscultation, abdomen soft nontender, neurological power 5 out of 5 upper lower extremity sensation grossly intact to light touch.  Extremities trace edema.  Dr. Charlie Patterson

## 2024-03-18 NOTE — ED Notes (Signed)
 MD at bedside cleared for CT

## 2024-03-18 NOTE — ED Provider Notes (Signed)
 [facility] Provider Note    Event Date/Time   First MD Initiated Contact with Patient 03/18/24 1141     (approximate)   History   Numbness   HPI  Thomas Calhoun is a 68 year old male with history of hypertension, CHF presenting to the emergency department for evaluation of right-sided paresthesias.  Yesterday morning, patient had an episode of paresthesias described as a pins and needle sensation of his right face, ear, foot that lasted for about an hour before resolving.  He felt normal when he went to bed.  Around 830 this morning had recurrent episode of paresthesias over his right side including his face and leg.  At the time of my initial evaluation, does feel that these have resolved.  Nuys focal weakness.  Denies history of similar before yesterday.    Physical Exam   Triage Vital Signs: ED Triage Vitals  Encounter Vitals Group     BP 03/18/24 1105 (!) 178/96     Girls Systolic BP Percentile --      Girls Diastolic BP Percentile --      Boys Systolic BP Percentile --      Boys Diastolic BP Percentile --      Pulse Rate 03/18/24 1105 (!) 58     Resp 03/18/24 1105 20     Temp 03/18/24 1105 98.6 F (37 C)     Temp Source 03/18/24 1105 Oral     SpO2 03/18/24 1105 94 %     Weight 03/18/24 1106 270 lb (122.5 kg)     Height 03/18/24 1106 5' 10 (1.778 m)     Head Circumference --      Peak Flow --      Pain Score 03/18/24 1106 0     Pain Loc --      Pain Education --      Exclude from Growth Chart --     Most recent vital signs: Vitals:   03/18/24 1245 03/18/24 1248  BP:    Pulse: 60 (!) 59  Resp: (!) 22 16  Temp:    SpO2: 95% 97%     General: Awake, interactive  CV:  Regular rate, good peripheral perfusion.  Resp:  Unlabored respirations, lungs clear to auscultation Abd:  Nondistended, soft, nontender Neuro:  Keenly aware, correctly answers month and age, able to blink eyes and squeeze hands, normal horizontal extraocular  movements, no visual field loss, normal facial symmetry, no arm or leg motor drift, no limb ataxia, normal sensation, no aphasia, no dysarthria, no inattention. NIH 0    ED Results / Procedures / Treatments   Labs (all labs ordered are listed, but only abnormal results are displayed) Labs Reviewed  CBC - Abnormal; Notable for the following components:      Result Value   MCH 34.9 (*)    Platelets 103 (*)    All other components within normal limits  COMPREHENSIVE METABOLIC PANEL WITH GFR - Abnormal; Notable for the following components:   Glucose, Bld 109 (*)    BUN 26 (*)    ALT 46 (*)    Total Bilirubin 1.8 (*)    All other components within normal limits  DIFFERENTIAL     EKG EKG independently reviewed and interpreted by myself demonstrates:  EKG demonstrates paced rhythm at a rate of 60, PR 206, QRS 92, QTc 388, no acute ST changes  RADIOLOGY Imaging independently reviewed and interpreted by myself demonstrates:  CT head without acute bleed  Formal Radiology Read:  CT HEAD WO CONTRAST Result Date: 03/18/2024 EXAM: CT HEAD WITHOUT CONTRAST 03/18/2024 12:03:27 PM TECHNIQUE: CT of the head was performed without the administration of intravenous contrast. Automated exposure control, iterative reconstruction, and/or weight based adjustment of the mA/kV was utilized to reduce the radiation dose to as low as reasonably achievable. COMPARISON: CT of the head dated 04/13/2022. CLINICAL HISTORY: Numbness or tingling, paresthesia (Ped 0-17y). Pt c/o intermittent RLE numbness and numbness around mouth, R ear, and R eye starting today. Denies pain. Pt reports having numbness in RLE yesterday which resolved and returned this morning. LKW 0830. Speech is clear, face is symmetric, and denies weakness. FINDINGS: BRAIN AND VENTRICLES: No acute hemorrhage. Gray-white differentiation is preserved. No hydrocephalus. No extra-axial collection. No mass effect or midline shift. Mild-to-moderate  periventricular and deep cerebral white matter disease present. ORBITS: No acute abnormality. SINUSES: The patient is status post bilateral sinonasal surgery. There is near complete opacification of the frontal sinuses and right ethmoid air cells. There is disease within the right maxillary and sphenoid sinuses. SOFT TISSUES AND SKULL: No acute soft tissue abnormality. No skull fracture. IMPRESSION: 1. No acute intracranial abnormality related to the reported numbness and tingling. 2. Mild-to-moderate periventricular and deep cerebral white matter disease. 3. Status post bilateral sinonasal surgery with near complete opacification of the frontal sinuses and right ethmoid air cells. Disease within the right maxillary and sphenoid sinuses. Electronically signed by: evalene coho 03/18/2024 12:40 PM EDT RP Workstation: [id number]    PROCEDURES:  Critical Care performed: No  Procedures   MEDICATIONS ORDERED IN ED: Medications - No data to display   IMPRESSION / MDM / ASSESSMENT AND PLAN / ED COURSE  I reviewed the triage vital signs and the nursing notes.  Differential diagnosis includes, but is not limited to, TIA, CVA, electrolyte abnormality, intracranial bleed or other acute intracranial process  Patient's presentation is most consistent with acute presentation with potential threat to life or bodily function.  68 year old male presenting with right-sided paresthesias with 2 resolved episodes over the past 2 days.  Presents within the thrombolytic window, but with resolved deficits, no indication for code stroke activation.  Labs here are reassuring.  CT head without acute findings.  Clinical history concerning for TIA with unilateral paresthesias.  With 2 episodes in short timeframe, do think inpatient evaluation is reasonable.  Will reach out to hospitalist team.  Clinical Course as of 03/18/24 1349  Tue Mar 18, 2024  1349 Case discussed with hospitalist team.  They will evaluate for  anticipated admission. [NR]    Clinical Course User Index [NR] Levander Slate, MD     FINAL CLINICAL IMPRESSION(S) / ED DIAGNOSES   Final diagnoses:  Paresthesias     Rx / DC Orders   ED Discharge Orders     None        Note:  This document was prepared using Dragon voice recognition software and may include unintentional dictation errors.   Levander Slate, MD 03/18/24 [phone]

## 2024-03-18 NOTE — Progress Notes (Signed)
 Rapid Response Event Note   Reason for Call : Code Stroke   Initial Focused Assessment: On my arrival pt is resting in bed, alert and oriented X4. Primary RN states that pt came in for TIA, all symptoms resolved, she received a phone call from pt's wife saying that pt was c/o tingling in face. Pt states that at this time he does have some tingling on right side of face. He also states that he feels weird and shaky but not as bad as he did earlier when he come to the hospital. Vitals are stable at this time. BP 164/88 HR 60. Primary RN states pt's LKW 2130.   Interventions: NIH performed and is 0. CBG 126. Pt taken for STAT head CT. Teleneuro activated. Pt taken back to room 120 where teleneurologist assessed pt. NIH remains 0 at this time. Teleneuro to contact Dr. Cleatus for updates.   Plan of Care: Pt remains on 1C at this time. Primary RN notified to call with any needs or questions.     Event Summary:   MD Notified: Dr. Cleatus Call Time: 2142 Arrival Time: 2145 End Time:2230  Duwaine LOISE Cools, RN

## 2024-03-18 NOTE — ED Notes (Signed)
 Called CCMD for central monitoring at this time

## 2024-03-18 NOTE — Telephone Encounter (Signed)
 Noted and agree with above as concern for CVA based on patient history and current symptoms.

## 2024-03-18 NOTE — ED Triage Notes (Signed)
 Pt c/o intermittent RLE numbness and numbness around mouth, R ear, and R eye starting today.  Denies pain.  Pt reports having numbness in RLE yesterday which resolved and returned this morning.    LKW 0830.  Speech is clear, face is symmetric, and denies weakness.

## 2024-03-18 NOTE — Telephone Encounter (Signed)
 Patient came into the office this morning with no appointment requested to either see a nurse or provider. He stated he didn't feel right and he had heart troubles. Our admin staff asked if they needed to call EMS and patient said no. I at this point overheard the conversation and went to discuss with the patient.   We proceed to our procedure room where I asked the patient what was wrong. He stated the right side of his face felt and right leg felt funny. I went ahead and checked his vitals  BP: 194/102 Right arm, sitting HR: 60 O2% 95  Upon getting these readings it was determined the patients PCP was Jolene Cannady, FNP and so I updated her on the situation. She said he needed to go straight to the ED. I updated patient and he called both his wife and a friend. The friend was coming to pick him up to take. I stayed with the patient in the procedure room until the friend arrived. Once the friend arrived they left with no other issues.

## 2024-03-18 NOTE — Consult Note (Signed)
 [facility] Consult [facility]   Patient Name:   Thomas Calhoun, Thomas Calhoun Date of Birth:   June 07, 1956 Identification Number:   MRN - [id number] Date of Service:   03/18/2024 21:59:43  Diagnosis:       R29.818 - Transient neurological symptoms  Impression:      68 yo male who was admitted earlier today with some transient paresthesias of his right face, ear, left hand and foot lasting about an hour and then resolving yesterday morning. He had recurrent episode this morning and came to the hospital. On admission, his CT head was negative for acute findings. Chronic ischemic changes noted. CTA head and neck were negative for large vessel occlusion or significant stenosis. He was admitted for further workup. This evening, he had been back to baseline when patient noted recurrent right facial numbness. This lasted about 20 minutes or so then resolved again.  Examination currently is nonfocal, NIHSS = 0. Repeat CT head revealed no bleed or acute findings.  Recurrent transient neurologic symptoms, likely TIA versus stroke with resolved deficit. Cerebrovascular workup and management.  Our recommendations are outlined below.  Recommendations:        Stroke/Telemetry Floor       Neuro Checks (Q4)       Bedside Swallow Eval       DVT Prophylaxis       IV Fluids, Normal Saline       Head of Bed 30 Degrees       Euglycemia and Avoid Hyperthermia (PRN Acetaminophen )       Bolus with Clopidogrel  300 mg bolus x1 and initiate dual antiplatelet therapy with Aspirin  81 mg daily and Clopidogrel  75 mg daily       Antihypertensives PRN if Blood pressure is greater than 220/120 or there is a concern for End organ damage/contraindications for permissive HTN. If blood pressure is greater than 220/120 give labetalol  PO or IV or Vasotec IV with a goal of 15% reduction in BP during the first 24 hours.       Stroke risk factor modification       Stroke education.       Suggest  echocardiogram with bubble study if able.       Consider MRI brain if AICD/pacemaker is MRI compatible.  Sign Out:       Discussed with Primary Attending       Discussed with Rapid Response Team    ------------------------------------------------------------------------------  Metrics: Last Known Well: 03/18/2024 21:30:00 Dispatch Time: 03/18/2024 21:59:43 Initial Response Time: 03/18/2024 22:07:38 Symptoms: Recurrent transient right facial numbness.. Initial patient interaction: 03/18/2024 22:09:58 NIHSS Assessment Completed: 03/18/2024 22:14:19 Patient is not a candidate for Thrombolytic. Thrombolytic Medical Decision: 03/18/2024 22:14:20 Patient was not deemed candidate for Thrombolytic because of following reasons: Resolved symptoms .  CT Head: CT head unremarkable for acute infarction or hemorrhage per Radiology: No bleed or acute abnormalities. Imaging reviewed personally, agree with radiology report.  Primary Provider Notified of Diagnostic Impression and Management Plan on: 03/18/2024 22:28:57 Spoke With: Dr. Cleatus Able to Reach 03/18/2024 22:28:57    ------------------------------------------------------------------------------  History of Present Illness: Patient is a 68 year old Male.  Inpatient stroke alert was called for symptoms of Recurrent transient right facial numbness.. 68 yo male who was admitted earlier today with some transient paresthesias of his right face, ear, left hand and foot lasting about an hour and then resolving yesterday morning. He had recurrent episode this morning and came to the hospital. On admission, his CT head was  negative for acute findings. Chronic ischemic changes noted. CTA head and neck were negative for large vessel occlusion or significant stenosis. He was admitted for further workup. This evening, he had been back to baseline when patient noted recurrent right facial numbness. This lasted about 20 minutes or so then resolved  again.    Past Medical History:      Hypertension      Hyperlipidemia Other PMH:  OSA, asthma, chronic kidney disease, chronic thrombocytopenia, nonischemic cardiomyopathy, CHF, history of V. tach status post AICD, GERD, Meralgia paresthetica, testicular hypofunction  Medications:  No Anticoagulant use  Antiplatelet use: Yes Aspirin  81 mg daily Reviewed EMR for current medications Other Medications Pertinent To Assessment Include: Acetaminophen , albuterol , Breztri , loratadine , losartan , magnesium  oxide, melatonin, pantoprazole , Crestor , sotalol   Allergies:  Reviewed Description: Amlodipine, moxifloxacin, Avelox, myocarditis  Social History: Smoking: No Alcohol Use: No Drug Use: No  Family History:  There Is Family History Of:CAD, hypertension, skin cancer, stroke, hypertension, ADD There is no family history of premature cerebrovascular disease pertinent to this consultation  ROS : 14 Points Review of Systems was performed and was negative except mentioned in HPI.  Past Surgical History: There Is No Surgical History Contributory To Today's Visit There Is Surgical History of:  AICD, pacemaker , Appendectomy, left ear surgery, nasal sinus surgery, small intestine surgery     Examination: BP(141/82), Pulse(59), Blood Glucose(126) 1A: Level of Consciousness - Alert; keenly responsive + 0 1B: Ask Month and Age - Both Questions Right + 0 1C: Blink Eyes & Squeeze Hands - Performs Both Tasks + 0 2: Test Horizontal Extraocular Movements - Normal + 0 3: Test Visual Fields - No Visual Loss + 0 4: Test Facial Palsy (Use Grimace if Obtunded) - Normal symmetry + 0 5A: Test Left Arm Motor Drift - No Drift for 10 Seconds + 0 5B: Test Right Arm Motor Drift - No Drift for 10 Seconds + 0 6A: Test Left Leg Motor Drift - No Drift for 5 Seconds + 0 6B: Test Right Leg Motor Drift - No Drift for 5 Seconds + 0 7: Test Limb Ataxia (FNF/Heel-Shin) - No Ataxia + 0 8: Test Sensation - Normal;  No sensory loss + 0 9: Test Language/Aphasia - Normal; No aphasia + 0 10: Test Dysarthria - Normal + 0 11: Test Extinction/Inattention - No abnormality + 0  NIHSS Score: 0   Pre-Morbid Modified Rankin Scale: 0 Points = No symptoms at all  Spoke with : Dr. Cleatus I reviewed the available imaging via Rapid and initiated discussion with the primary provider  This consult was conducted in real time using interactive audio and video technology. Patient was informed of the technology being used for this visit and agreed to proceed. Patient located in hospital and provider located at home/office setting.   Patient is being evaluated for possible acute neurologic impairment and high probability of imminent or life-threatening deterioration. I spent total of 37 minutes providing care to this patient, including time for face to face visit via telemedicine, review of medical records, imaging studies and discussion of findings with providers, the patient and/or family.    Dr Selinda Schilder   [facility] For Inpatient follow-up with [facility] physician please call [facility] at [phone]. As we are not an outpatient service for any post hospital discharge needs please contact the hospital for assistance. If you have any questions for the [facility] physicians or need to reconsult for clinical or diagnostic changes please contact us  via [facility] at [phone].  Signature : Selinda Schilder

## 2024-03-19 ENCOUNTER — Observation Stay
Admit: 2024-03-19 | Discharge: 2024-03-19 | Disposition: A | Discharge status: Home / Self Care | Attending: Internal Medicine | Admitting: Internal Medicine

## 2024-03-19 ENCOUNTER — Observation Stay

## 2024-03-19 ENCOUNTER — Encounter: Payer: Self-pay | Admitting: Internal Medicine

## 2024-03-19 DIAGNOSIS — R202 Paresthesia of skin: Secondary | ICD-10-CM | POA: Diagnosis not present

## 2024-03-19 DIAGNOSIS — I10 Essential (primary) hypertension: Secondary | ICD-10-CM | POA: Diagnosis not present

## 2024-03-19 DIAGNOSIS — Z8679 Personal history of other diseases of the circulatory system: Secondary | ICD-10-CM | POA: Diagnosis not present

## 2024-03-19 DIAGNOSIS — G459 Transient cerebral ischemic attack, unspecified: Secondary | ICD-10-CM | POA: Diagnosis not present

## 2024-03-19 DIAGNOSIS — D696 Thrombocytopenia, unspecified: Secondary | ICD-10-CM

## 2024-03-19 LAB — HEMOGLOBIN A1C
Hgb A1c MFr Bld: 5.4 % (ref 4.8–5.6)
Mean Plasma Glucose: 108 mg/dL

## 2024-03-19 LAB — ECHOCARDIOGRAM COMPLETE
AR max vel: 3.79 cm2
AV Area VTI: 3.9 cm2
AV Area mean vel: 3.68 cm2
AV Mean grad: 3 mmHg
AV Peak grad: 5.7 mmHg
Ao pk vel: 1.2 m/s
Area-P 1/2: 2.4 cm2
Height: 70 in
MV VTI: 2.67 cm2
S' Lateral: 3.5 cm
Weight: 4320 [oz_av]

## 2024-03-19 LAB — CBC
HCT: 43.1 % (ref 39.0–52.0)
Hemoglobin: 15.3 g/dL (ref 13.0–17.0)
MCH: 35.2 pg — ABNORMAL HIGH (ref 26.0–34.0)
MCHC: 35.5 g/dL (ref 30.0–36.0)
MCV: 99.1 fL (ref 80.0–100.0)
Platelets: 91 K/uL — ABNORMAL LOW (ref 150–400)
RBC: 4.35 MIL/uL (ref 4.22–5.81)
RDW: 11.8 % (ref 11.5–15.5)
WBC: 5.6 K/uL (ref 4.0–10.5)
nRBC: 0 % (ref 0.0–0.2)

## 2024-03-19 LAB — BASIC METABOLIC PANEL WITH GFR
Anion gap: 7 (ref 5–15)
BUN: 17 mg/dL (ref 8–23)
CO2: 27 mmol/L (ref 22–32)
Calcium: 8.9 mg/dL (ref 8.9–10.3)
Chloride: 105 mmol/L (ref 98–111)
Creatinine, Ser: 0.7 mg/dL (ref 0.61–1.24)
GFR, Estimated: >60 mL/min (ref >60)
Glucose, Bld: 96 mg/dL (ref 70–99)
Potassium: 3.6 mmol/L (ref 3.5–5.1)
Sodium: 139 mmol/L (ref 135–145)

## 2024-03-19 LAB — HIV ANTIBODY (ROUTINE TESTING W REFLEX): HIV Screen 4th Generation wRfx: NONREACTIVE

## 2024-03-19 LAB — MAGNESIUM: Magnesium: 2.1 mg/dL (ref 1.7–2.4)

## 2024-03-19 MED ORDER — CLOPIDOGREL BISULFATE 75 MG PO TABS: 75.0000 mg | ORAL_TABLET | Freq: Every day | ORAL | 0 refills | Status: DC

## 2024-03-19 MED ORDER — ASPIRIN 81 MG PO TBEC: 81.0000 mg | DELAYED_RELEASE_TABLET | Freq: Every day | ORAL | 0 refills | Status: AC

## 2024-03-19 MED ORDER — POTASSIUM CHLORIDE CRYS ER 10 MEQ PO TBCR
10.0000 meq | EXTENDED_RELEASE_TABLET | Freq: Every day | ORAL | Status: DC
  Administered 2024-03-19: 10 meq via ORAL
  Filled 2024-03-19: qty 1

## 2024-03-19 MED ORDER — METOPROLOL SUCCINATE ER 25 MG PO TB24
25.0000 mg | ORAL_TABLET | Freq: Every day | ORAL | Status: DC
  Administered 2024-03-19: 25 mg via ORAL
  Filled 2024-03-19: qty 1

## 2024-03-19 MED ORDER — DIAZEPAM 5 MG/ML IJ SOLN
2.5000 mg | Freq: Once | INTRAMUSCULAR | Status: AC | PRN
  Administered 2024-03-19: 2.5 mg via INTRAVENOUS
  Filled 2024-03-19: qty 2

## 2024-03-19 MED ORDER — EZETIMIBE 10 MG PO TABS: 10.0000 mg | ORAL_TABLET | Freq: Every day | ORAL | 0 refills | Status: DC

## 2024-03-19 NOTE — Progress Notes (Signed)
*  PRELIMINARY RESULTS* Echocardiogram 2D Echocardiogram has been performed.  Thomas Calhoun 03/19/2024, 7:58 AM

## 2024-03-19 NOTE — Assessment & Plan Note (Signed)
 Class 2 with BMI of 38.74

## 2024-03-19 NOTE — Assessment & Plan Note (Signed)
 On sotalol

## 2024-03-19 NOTE — Hospital Course (Addendum)
 68 y.o. year old male with past medical history of asthma, hypertension, hyperlipidemia, OSA, CKD stage IIIa, chronic thrombocytopenia, nonischemic cardiomyopathy, chronic systolic and diastolic and HFrEF, VT s/p AICD, GERD.  He presents to [facility] ED with reports of paresthesias described as a pins and needle sensation of his right face, ear, foot that lasted for about an hour before resolving yesterday morning. He felt normal when he went to bed. Around 830 this morning had recurrent episode of paresthesias over his right side including his face and leg.  He presented to his PCP for evaluation and was directed to come to ED for concerns of CVA.  He denies headache, vision change, aphasia, dizziness, weakness.  Patient is concerned that symptoms may be caused by a new medication he is taking Fasenra or artificial sweetener and propel water.    ED Course: On arrival to [facility] regional ED patient was noted to be afebrile temp 37 C, BP 170/96, HR 58, RR 20, SpO2 94% on room air.  CT head without obtained and shows no acute intracranial abnormality.  Labs notable for platelets 103, BUN 26, ALT 46, T. bili 1.8.  Lipid panel with total cholesterol 211 and LDL 118. [facility] contacted for admission.  CT angio head and neck no blockage Echo showed ef 55-60% and agitated saline study negative MRI brain negative for stroke No arrythmias on telemetry monitoring  Will treat like TIA with aspirin  lifelong, plavix  (for total of 21 days), patient declined statin with allergy muscle aches and will prescribe zetia .

## 2024-03-19 NOTE — Assessment & Plan Note (Signed)
 On trelegy inhaler

## 2024-03-19 NOTE — Discharge Summary (Signed)
 Physician Discharge Summary   Patient: Thomas Calhoun MRN: [id number] DOB: 17-Nov-1955  Admit date:     03/18/2024  Discharge date: 03/19/24  Discharge Physician: Charlie Patterson   PCP: Valerio Melanie DASEN, NP   Recommendations at discharge:    Follow up PCP 5 days Refer to neurology  Discharge Diagnoses: Principal Problem:   TIA (transient ischemic attack) Active Problems:   Essential hypertension   History of ventricular tachycardia   Thrombocytopenia (HCC)   Obesity (BMI 30-39.9)   History of asthma   Paresthesias    Hospital Course: 68 y.o. year old male with past medical history of asthma, hypertension, hyperlipidemia, OSA, CKD stage IIIa, chronic thrombocytopenia, nonischemic cardiomyopathy, chronic systolic and diastolic and HFrEF, VT s/p AICD, GERD.  He presents to [facility] ED with reports of paresthesias described as a pins and needle sensation of his right face, ear, foot that lasted for about an hour before resolving yesterday morning. He felt normal when he went to bed. Around 830 this morning had recurrent episode of paresthesias over his right side including his face and leg.  He presented to his PCP for evaluation and was directed to come to ED for concerns of CVA.  He denies headache, vision change, aphasia, dizziness, weakness.  Patient is concerned that symptoms may be caused by a new medication he is taking Fasenra or artificial sweetener and propel water.    ED Course: On arrival to [facility] regional ED patient was noted to be afebrile temp 37 C, BP 170/96, HR 58, RR 20, SpO2 94% on room air.  CT head without obtained and shows no acute intracranial abnormality.  Labs notable for platelets 103, BUN 26, ALT 46, T. bili 1.8.  Lipid panel with total cholesterol 211 and LDL 118. TR[facility] contacted for admission.  CT angio head and neck no blockage Echo showed ef 55-60% and agitated saline study negative MRI brain negative for stroke No arrythmias on telemetry  monitoring  Will treat like TIA with aspirin  lifelong, plavix  (for total of 21 days), patient declined statin with allergy muscle aches and will prescribe zetia .  Assessment and Plan: * TIA (transient ischemic attack) With paresthesias right face and leg.  MRI brain negative, will treat like TIA with aspirin  lifelong, plavix  (total 21 days).  Patient declined statin secondary to muscle aches.  Will prescribe zetia .  LDL 118.  Essential hypertension Toprol  and losartan   History of ventricular tachycardia On sotalol   Thrombocytopenia (HCC) Chronic.  Obesity (BMI 30-39.9) Class 2 with BMI of 38.74  History of asthma On trelegy inhaler         Consultants: neurology Procedures performed: none Disposition: Home Diet recommendation:  Cardiac diet DISCHARGE MEDICATION: Allergies as of 03/19/2024       Reactions   Amlodipine Besylate Swelling   Moxifloxacin Hives, Itching   Other reaction(s): Unknown   Avelox [moxifloxacin Hcl In Nacl] Hives, Itching   Micardis  [telmisartan ] Itching        Medication List     STOP taking these medications    mometasone  220 MCG/INH inhaler Commonly known as: ASMANEX    rosuvastatin  10 MG tablet Commonly known as: CRESTOR    Wixela Inhub 500-50 MCG/ACT Aepb Generic drug: fluticasone -salmeterol       TAKE these medications    albuterol  108 (90 Base) MCG/ACT inhaler Commonly known as: VENTOLIN  HFA Inhale 2 puffs into the lungs every 6 (six) hours as needed for wheezing or shortness of breath.   albuterol  (2.5 MG/3ML) 0.083% nebulizer solution  Commonly known as: PROVENTIL  Take 2.5 mg by nebulization every 6 (six) hours as needed for wheezing or shortness of breath.   ascorbic acid  500 MG tablet Commonly known as: VITAMIN C  Take 500 mg by mouth daily.   aspirin  EC 81 MG tablet Take 1 tablet (81 mg total) by mouth daily. Swallow whole. Start taking on: March 20, 2024   azelastine  0.1 % nasal spray Commonly known as:  ASTELIN  Place 2 sprays into both nostrils 2 (two) times daily. Use in each nostril as directed   clopidogrel  75 MG tablet Commonly known as: PLAVIX  Take 1 tablet (75 mg total) by mouth daily. Start taking on: March 20, 2024   ezetimibe  10 MG tablet Commonly known as: Zetia  Take 1 tablet (10 mg total) by mouth daily.   Fasenra Pen 30 MG/ML prefilled autoinjector Generic drug: benralizumab Inject 30 mg into the skin every 30 (thirty) days.   KRILL OIL PO Take 1 capsule by mouth daily.   loratadine  10 MG tablet Commonly known as: CLARITIN  Take 10 mg by mouth daily.   losartan  25 MG tablet Commonly known as: COZAAR  Take 50 mg by mouth daily.   magnesium  oxide 400 MG tablet Commonly known as: MAG-OX Take 400 mg by mouth every 12 (twelve) hours.   metoprolol  succinate 25 MG 24 hr tablet Commonly known as: TOPROL -XL Take 25 mg by mouth daily.   montelukast  10 MG tablet Commonly known as: SINGULAIR  Take 10 mg by mouth at bedtime.   potassium chloride  10 MEQ tablet Commonly known as: KLOR-CON  Take 10 mEq by mouth daily.   sotalol  120 MG tablet Commonly known as: BETAPACE  Take 120 mg by mouth 2 (two) times daily.   Trelegy Ellipta 200-62.5-25 MCG/ACT Aepb Generic drug: Fluticasone -Umeclidin-Vilant Inhale 1 puff into the lungs daily.   vitamin E  180 MG (400 UNITS) capsule Take 400 Units by mouth daily.   Zinc Gluconate 30 MG Tabs Take 30 mg by mouth daily.        Follow-up Information     Valerio Moris T, NP Follow up in 5 day(s).   Specialty: Nurse Practitioner Contact information: [address] [place] [address] [phone]         Maree Jannett POUR, MD Follow up in 3 week(s).   Specialty: Neurology Why: hospital follow up for possible tia, numbness Contact information: [address] [facility] [place] [address] [phone]                Discharge Exam: Filed Weights   03/18/24 1106  Weight: 122.5 kg    Physical Exam HENT:     Head: Normocephalic.     Mouth/Throat:     Pharynx: No oropharyngeal exudate.  Eyes:     General: Lids are normal.     Conjunctiva/sclera: Conjunctivae normal.  Cardiovascular:     Rate and Rhythm: Normal rate and regular rhythm.     Heart sounds: Normal heart sounds, S1 normal and S2 normal.  Pulmonary:     Breath sounds: No decreased breath sounds, wheezing, rhonchi or rales.  Abdominal:     Palpations: Abdomen is soft.     Tenderness: There is no abdominal tenderness.  Musculoskeletal:     Right lower leg: No swelling.     Left lower leg: No swelling.  Skin:    General: Skin is warm.     Findings: No rash.  Neurological:     Mental Status: He is alert and oriented to person, place,  and time.     Comments: Power 5/5 bilaterally      Condition at discharge: stable  The results of significant diagnostics from this hospitalization (including imaging, microbiology, ancillary and laboratory) are listed below for reference.   Imaging Studies: MR BRAIN WO CONTRAST Result Date: 03/19/2024 EXAM: MRI BRAIN WITHOUT CONTRAST 03/19/2024 11:11:04 AM TECHNIQUE: Multiplanar multisequence MRI of the head/brain was performed without the administration of intravenous contrast. COMPARISON: CT of the head dated 03/18/2024. CLINICAL HISTORY: Neuro deficit, acute, stroke suspected. Thomas Calhoun is a 68 y.o. year old male with past medical history of asthma, hypertension, hyperlipidemia, OSA, CKD stage IIIa, chronic thrombocytopenia, nonischemic cardiomyopathy, chronic systolic and diastolic and HFrEF, VT s/p AICD, GERD. He presents to [facility] ED with reports of paresthesias described as a pins and needle sensation of his right face, ear, foot that lasted for about an hour before resolving yesterday morning. He felt normal when he went to bed. Around 830 this morning had recurrent episode of paresthesias over his right side including his face and leg. He  presented to his PCP for evaluation and was directed to come to ED for concerns of CVA. He denies headache, vision change, aphasia, dizziness, weakness. FINDINGS: BRAIN AND VENTRICLES: No acute infarct. No intracranial hemorrhage. No mass. No midline shift. No hydrocephalus. The sella is unremarkable. Normal flow voids. Moderately advanced cerebral white matter disease. ORBITS: No acute abnormality. SINUSES AND MASTOIDS: Moderate opacification of the left frontal sinus and of the ethmoid air cells. The patient is status post bilateral sinonasal surgery. There is a left mastoid air cell effusion. BONES AND SOFT TISSUES: Normal marrow signal. No acute soft tissue abnormality. IMPRESSION: 1. No acute intracranial abnormality. 2. Moderately advanced cerebral  white matter disease. 3. Moderate opacification of the left frontal sinus and ethmoid air cells, status post bilateral sinonasal surgery, and left mastoid air cell effusion. Electronically signed by: evalene berrigan 03/19/2024 02:00 PM EDT RP Workstation: [id number]   ECHOCARDIOGRAM COMPLETE Result Date: 03/19/2024    ECHOCARDIOGRAM REPORT   Patient Name:   Thomas Calhoun Date of Exam: 03/19/2024 Medical Rec #:  [id number]         Height:       70.0 in Accession #:    [id number]        Weight:       270.0 lb Date of Birth:  Dec 21, 1955         BSA:          2.371 m Patient Age:    68 years          BP:           107/70 mmHg Patient Gender: M                 HR:           58 bpm. Exam Location:  [facility] Procedure: 2D Echo, Cardiac Doppler, Color Doppler and Saline Contrast Bubble            Study (Both Spectral and Color Flow Doppler were utilized during            procedure). Indications:     TIA G45.9  History:         Patient has prior history of Echocardiogram examinations, most                  recent 12/27/2018. Risk Factors:Hypertension.  Sonographer:     Christopher Furnace Referring Phys:  [id number] CHARLIE PATTERSON Diagnosing  Phys: Dwayne D Callwood MD IMPRESSIONS  1.  Left ventricular ejection fraction, by estimation, is 55 to 60%. The left ventricle has normal function. The left ventricle has no regional wall motion abnormalities. There is moderate concentric left ventricular hypertrophy. Left ventricular diastolic parameters are consistent with Grade I diastolic dysfunction (impaired relaxation).  2. Right ventricular systolic function is normal. The right ventricular size is normal.  3. The mitral valve is normal in structure. Trivial mitral valve regurgitation.  4. The aortic valve is normal in structure. Aortic valve regurgitation is not visualized.  5. Agitated saline contrast bubble study was negative, with no evidence of any interatrial shunt. FINDINGS  Left Ventricle: Left ventricular ejection fraction, by estimation, is 55 to 60%. The left ventricle has normal function. The left ventricle has no regional wall motion abnormalities. Strain was performed and the global longitudinal strain is indeterminate. The left ventricular internal cavity size was normal in size. There is moderate concentric left ventricular hypertrophy. Left ventricular diastolic parameters are consistent with Grade I diastolic dysfunction (impaired relaxation). Right Ventricle: The right ventricular size is normal. No increase in right ventricular wall thickness. Right ventricular systolic function is normal. Left Atrium: Left atrial size was normal in size. Right Atrium: Right atrial size was normal in size. Pericardium: There is no evidence of pericardial effusion. Mitral Valve: The mitral valve is normal in structure. Trivial mitral valve regurgitation. MV peak gradient, 5.5 mmHg. The mean mitral valve gradient is 2.0 mmHg. Tricuspid Valve: The tricuspid valve is normal in structure. Tricuspid valve regurgitation is not demonstrated. Aortic Valve: The aortic valve is normal in structure. Aortic valve regurgitation is not visualized. Aortic valve mean gradient measures 3.0 mmHg. Aortic valve peak  gradient measures 5.7 mmHg. Aortic valve area, by VTI measures 3.90 cm. Pulmonic Valve: The pulmonic valve was normal in structure. Pulmonic valve regurgitation is not visualized. Aorta: The ascending aorta was not well visualized. IAS/Shunts: No atrial level shunt detected by color flow Doppler. Agitated saline contrast was given intravenously to evaluate for intracardiac shunting. Agitated saline contrast bubble study was negative, with no evidence of any interatrial shunt. Additional Comments: 3D was performed not requiring image post processing on an independent workstation and was indeterminate.  LEFT VENTRICLE PLAX 2D LVIDd:         4.90 cm   Diastology LVIDs:         3.50 cm   LV e' medial:    6.09 cm/s LV PW:         1.40 cm   LV E/e' medial:  10.7 LV IVS:        1.40 cm   LV e' lateral:   6.31 cm/s LVOT diam:     2.30 cm   LV E/e' lateral: 10.3 LV SV:         89 LV SV Index:   37 LVOT Area:     4.15 cm  RIGHT VENTRICLE RV Basal diam:  2.80 cm RV Mid diam:    2.20 cm RV S prime:     15.90 cm/s TAPSE (M-mode): 1.9 cm LEFT ATRIUM           Index        RIGHT ATRIUM           Index LA diam:      3.80 cm 1.60 cm/m   RA Area:     12.30 cm LA Vol (A2C): 36.0 ml 15.18 ml/m  RA Volume:   24.90 ml  10.50  ml/m LA Vol (A4C): 24.0 ml 10.12 ml/m  AORTIC VALVE AV Area (Vmax):    3.79 cm AV Area (Vmean):   3.68 cm AV Area (VTI):     3.90 cm AV Vmax:           119.50 cm/s AV Vmean:          78.650 cm/s AV VTI:            0.228 m AV Peak Grad:      5.7 mmHg AV Mean Grad:      3.0 mmHg LVOT Vmax:         109.00 cm/s LVOT Vmean:        69.600 cm/s LVOT VTI:          0.214 m LVOT/AV VTI ratio: 0.94  AORTA Ao Root diam: 4.10 cm MITRAL VALVE               TRICUSPID VALVE MV Area (PHT): 2.40 cm    TR Peak grad:   23.2 mmHg MV Area VTI:   2.67 cm    TR Vmax:        241.00 cm/s MV Peak grad:  5.5 mmHg MV Mean grad:  2.0 mmHg    SHUNTS MV Vmax:       1.17 m/s    Systemic VTI:  0.21 m MV Vmean:      65.6 cm/s   Systemic  Diam: 2.30 cm MV Decel Time: 316 msec MV E velocity: 65.00 cm/s MV A velocity: 96.40 cm/s MV E/A ratio:  0.67 Dwayne D Callwood MD Electronically signed by Cara JONETTA Lovelace MD Signature Date/Time: 03/19/2024/1:33:33 PM    Final    DG Chest Port 1 View Result Date: 03/19/2024 EXAM: 1 VIEW XRAY OF THE CHEST 03/19/2024 07:25:06 AM COMPARISON: 12/27/2018, 02/22/2024 CLINICAL HISTORY: Hx of intrinsic asthma. Ordering comments needed for clearance for MRI. FINDINGS: LUNGS AND PLEURA: No focal pulmonary opacity. No pulmonary edema. No pleural effusion. No pneumothorax. Lung volumes are low. HEART AND MEDIASTINUM: No acute abnormality of the cardiac and mediastinal silhouettes. Pacing and AICD wires are stable. BONES AND SOFT TISSUES: No acute osseous abnormality. Chronic elevation of the right hemidiaphragm is noted. IMPRESSION: 1. No acute findings. 2. Low lung volumes and chronic elevation of the right hemidiaphragm. Electronically signed by: Lonni Necessary MD 03/19/2024 07:53 AM EDT RP Workstation: [id number]   CT HEAD CODE STROKE WO CONTRAST` Addendum Date: 03/18/2024 ADDENDUM REPORT: 03/18/2024 22:19 ADDENDUM: Findings discussed with Dr. Cleatus via telephone at 10:19 p.m. Electronically Signed   By: Gilmore GORMAN Molt M.D.   On: 03/18/2024 22:19   Result Date: 03/18/2024 CLINICAL DATA:  Code stroke.  Neuro deficit, acute, stroke suspected EXAM: CT HEAD WITHOUT CONTRAST TECHNIQUE: Contiguous axial images were obtained from the base of the skull through the vertex without intravenous contrast. RADIATION DOSE REDUCTION: This exam was performed according to the departmental dose-optimization program which includes automated exposure control, adjustment of the mA and/or kV according to patient size and/or use of iterative reconstruction technique. COMPARISON:  CT head earlier today. FINDINGS: Brain: No evidence of acute large vascular territory infarction, hemorrhage, hydrocephalus, extra-axial collection or mass  lesion/mass effect. Vascular: No hyperdense vessel or unexpected calcification. Skull: Normal. Negative for fracture or focal lesion. Sinuses/Orbits: No acute finding. ASPECTS [facility] Stroke Program Early CT Score) Total score (0-10 with 10 being normal): 10. IMPRESSION: No evidence of acute intracranial abnormality.  ASPECTS is 10. Electronically Signed: By: Gilmore GORMAN Molt M.D. On: 03/18/2024 22:10  CT ANGIO HEAD NECK W WO CM Result Date: 03/18/2024 EXAM: CT HEAD WITHOUT CTA HEAD AND NECK WITH AND WITHOUT 03/18/2024 04:51:15 PM TECHNIQUE: CTA of the head and neck was performed with and without the administration of intravenous contrast. Noncontrast CT of the head with reconstructed 2-D images are also provided for review. Multiplanar 2D and/or 3D reformatted images are provided for review. Automated exposure control, iterative reconstruction, and/or weight based adjustment of the mA/kV was utilized to reduce the radiation dose to as low as reasonably achievable. COMPARISON: CT of the head dated 03/28/2024. CLINICAL HISTORY: Transient ischemic attack (TIA). Pt c/o intermittent RLE numbness and numbness around mouth, R ear, and R eye starting today. Denies pain. Pt reports having numbness in RLE yesterday which resolved and returned this morning. FINDINGS: CT HEAD: BRAIN AND VENTRICLES: No acute intracranial hemorrhage. No mass effect. No midline shift. No extra-axial fluid collection. Gray-white differentiation is maintained. No hydrocephalus. ORBITS: No acute abnormality. SINUSES AND MASTOIDS: No acute abnormality. CTA NECK: AORTIC ARCH AND ARCH VESSELS: No dissection or arterial injury. No significant stenosis of the brachiocephalic or subclavian arteries. CERVICAL CAROTID ARTERIES: Mild calcific plaque within the proximal right internal carotid artery, but no flow-limiting stenosis. Calcific plaque within the origin of the left internal carotid artery with no flow-limiting stenosis. CERVICAL VERTEBRAL  ARTERIES: The cervical segment is tortuous. The vertebral arteries are codominant and appeared normal in caliber throughout their respective courses. No dissection, arterial injury, or significant stenosis. LUNGS AND MEDIASTINUM: Unremarkable. SOFT TISSUES: No acute abnormality. BONES: No acute abnormality. CTA HEAD: ANTERIOR CIRCULATION: No significant stenosis of the internal carotid arteries. No significant stenosis of the anterior cerebral arteries. No significant stenosis of the middle cerebral arteries. No aneurysm. Mild diffuse calcific plaque within the carotid siphons, but no flow-limiting stenosis. POSTERIOR CIRCULATION: No significant stenosis of the posterior cerebral arteries. No significant stenosis of the basilar artery. No significant stenosis of the vertebral arteries. No aneurysm. OTHER: No dural venous sinus thrombosis on this non-dedicated study. IMPRESSION: 1. No acute findings. 2. Mild calcific plaque within the proximal right internal carotid artery, origin of the left internal carotid artery, and carotid siphons without flow-limiting stenosis. Electronically signed by: evalene coho 03/18/2024 04:58 PM EDT RP Workstation: [id number]   CT HEAD WO CONTRAST Result Date: 03/18/2024 EXAM: CT HEAD WITHOUT CONTRAST 03/18/2024 12:03:27 PM TECHNIQUE: CT of the head was performed without the administration of intravenous contrast. Automated exposure control, iterative reconstruction, and/or weight based adjustment of the mA/kV was utilized to reduce the radiation dose to as low as reasonably achievable. COMPARISON: CT of the head dated 04/13/2022. CLINICAL HISTORY: Numbness or tingling, paresthesia (Ped 0-17y). Pt c/o intermittent RLE numbness and numbness around mouth, R ear, and R eye starting today. Denies pain. Pt reports having numbness in RLE yesterday which resolved and returned this morning. LKW 0830. Speech is clear, face is symmetric, and denies weakness. FINDINGS: BRAIN AND VENTRICLES:  No acute hemorrhage. Gray-white differentiation is preserved. No hydrocephalus. No extra-axial collection. No mass effect or midline shift. Mild-to-moderate periventricular and deep cerebral white matter disease present. ORBITS: No acute abnormality. SINUSES: The patient is status post bilateral sinonasal surgery. There is near complete opacification of the frontal sinuses and right ethmoid air cells. There is disease within the right maxillary and sphenoid sinuses. SOFT TISSUES AND SKULL: No acute soft tissue abnormality. No skull fracture. IMPRESSION: 1. No acute intracranial abnormality related to the reported numbness and tingling. 2. Mild-to-moderate periventricular and deep cerebral white matter disease.  3. Status post bilateral sinonasal surgery with near complete opacification of the frontal sinuses and right ethmoid air cells. Disease within the right maxillary and sphenoid sinuses. Electronically signed by: evalene coho 03/18/2024 12:40 PM EDT RP Workstation: [id number]   CT CHEST WO CONTRAST Result Date: 02/24/2024 CLINICAL DATA:  Pulmonary nodule, chronic cough EXAM: CT CHEST WITHOUT CONTRAST TECHNIQUE: Multidetector CT imaging of the chest was performed following the standard protocol without IV contrast. RADIATION DOSE REDUCTION: This exam was performed according to the departmental dose-optimization program which includes automated exposure control, adjustment of the mA and/or kV according to patient size and/or use of iterative reconstruction technique. COMPARISON:  None Available. FINDINGS: Cardiovascular: Left subclavian dual lead pacemaker in place with leads within the right atrium and right ventricle. Moderate coronary artery calcification. Global cardiac size mildly enlarged. No pericardial effusion. Central pulmonary arteries are enlarged in keeping with changes of pulmonary arterial hypertension. No significant atherosclerotic calcification within the thoracic aorta. No aortic  aneurysm. Mediastinum/Nodes: No enlarged mediastinal or axillary lymph nodes. Thyroid  gland, trachea, and esophagus demonstrate no significant findings. Lungs/Pleura: Subtle bilateral upper lobe and lingular tree-in-bud nodularity is present, most in keeping changes of chronic MAI infection. No focal consolidation. Bronchial wall thickening is present diffusely in keeping with airway inflammation. There is mosaic attenuation of the pulmonary parenchyma related to air trapping due to small airways disease. No pneumothorax or pleural effusion. No central obstructing lesion. No suspicious or indeterminate focal pulmonary nodules are identified., Upper Abdomen: Simple cyst within the visualized liver. No acute abnormality within the visualized upper abdomen. Musculoskeletal: No chest wall mass or suspicious bone lesions identified. IMPRESSION: 1. Subtle bilateral upper lobe and lingular tree-in-bud nodularity is present, compatible with chronic MAI infection. 2. Airway inflammation.  Resultant multifocal air trapping. 3. Mild cardiomegaly. Moderate coronary artery calcification. 4. Enlarged central pulmonary arteries in keeping with changes of pulmonary arterial hypertension. Electronically Signed   By: Dorethia Molt M.D.   On: 02/24/2024 21:03    Microbiology: Results for orders placed or performed during the hospital encounter of 11/07/19  SARS CORONAVIRUS 2 (TAT 6-24 HRS) Nasopharyngeal Nasopharyngeal Swab     Status: None   Collection Time: 11/07/19 11:52 AM   Specimen: Nasopharyngeal Swab  Result Value Ref Range Status   SARS Coronavirus 2 NEGATIVE NEGATIVE Final    Comment: (NOTE) SARS-CoV-2 target nucleic acids are NOT DETECTED. The SARS-CoV-2 RNA is generally detectable in upper and lower respiratory specimens during the acute phase of infection. Negative results do not preclude SARS-CoV-2 infection, do not rule out co-infections with other pathogens, and should not be used as the sole basis for  treatment or other patient management decisions. Negative results must be combined with clinical observations, patient history, and epidemiological information. The expected result is Negative. Fact Sheet for Patients: HairSlick.no Fact Sheet for Healthcare Providers: quierodirigir.com This test is not yet approved or cleared by the [place]  FDA and  has been authorized for detection and/or diagnosis of SARS-CoV-2 by FDA under an Emergency Use Authorization (EUA). This EUA will remain  in effect (meaning this test can be used) for the duration of the COVID-19 declaration under Section 56 4(b)(1) of the Act, 21 U.S.C. section [id number])(1), unless the authorization is terminated or revoked sooner. Performed at [facility], [address], [place], [place] [address]     Labs: CBC: Recent Labs  Lab 03/18/24 1109 03/19/24 0408  WBC 5.6 5.6  NEUTROABS 3.7  --   HGB 16.6 15.3  HCT 47.5 43.1  MCV 99.8 99.1  PLT 103* 91*   Basic Metabolic Panel: Recent Labs  Lab 03/18/24 1109 03/19/24 0408  NA 138 139  K 4.1 3.6  CL 101 105  CO2 30 27  GLUCOSE 109* 96  BUN 26* 17  CREATININE 0.74 0.70  CALCIUM  9.4 8.9  MG  --  2.1   Liver Function Tests: Recent Labs  Lab 03/18/24 1109  AST 38  ALT 46*  ALKPHOS 48  BILITOT 1.8*  PROT 7.5  ALBUMIN 4.4   CBG: Recent Labs  Lab 03/18/24 2145  GLUCAP 126*    Discharge time spent: greater than 30 minutes.  Signed: Charlie Patterson, MD [facility] 03/19/2024

## 2024-03-19 NOTE — Care Management Obs Status (Signed)
 MEDICARE OBSERVATION STATUS NOTIFICATION   Patient Details  Name: Thomas Calhoun MRN: [id number] Date of Birth: [birth year]   Medicare Observation Status Notification Given:  Yes    Brynlea Spindler W, CMA 03/19/2024, 11:42 AM

## 2024-03-19 NOTE — Progress Notes (Signed)
 Patient received from procedure via bed in stable condition.

## 2024-03-19 NOTE — Assessment & Plan Note (Signed)
 Chronic

## 2024-03-19 NOTE — Progress Notes (Signed)
  Device system confirmed [employer] , to be MRI conditional, with implant date > 6 weeks ago, and no evidence of abandoned or epicardial leads in review of most recent CXR  Device last cleared by EP Provider: Ozell Fischer Tillery 03/19/24  (Name and date)  Clearance is good through for 1 year as long as parameters remain stable at time of check. If pt undergoes a cardiac device procedure during that time, they should be re-cleared.   Tachy-therapies to be programmed off if applicable with device back to pre-MRI settings after completion of exam.  [employer] was available remotely to assist in programming recommendations.   Thomas Calhoun  03/19/2024 7:30 AM

## 2024-03-19 NOTE — Progress Notes (Signed)
 OT Cancellation Note  Patient Details Name: Thomas Calhoun MRN: [id number] DOB: Mar 16, 1956   Cancelled Treatment:    Reason Eval/Treat Not Completed: OT screened, no needs identified, will sign off. Upon entry to room, pt seated in recliner chair reporting that he was feeling anxious.  Vitals assessed: BP 158/76, HR 59, O2 on room air 93%.  Pt reported needing to go to bathroom.  Observed pt transfer Mod I, ambulate with Supervision only due to being connected to IV line and for IV pole management.  LB dressing Mod I while seated/standing, toileting completed with distant supervision only due to IV line/pole management.  Pt education on relaxation technique due to multiple reports of feeling anxious regarding BP.  Education provided on benefits of having a BP monitor in home setting.  Pt agreeable to screen only, no further intervention appears necessary as pt appears to be at baseline functionally related to ADLs and will have family support at discharge.   Harlene Sharps OTR/L     Harlene LITTIE Sharps 03/19/2024, 12:07 PM

## 2024-03-19 NOTE — Progress Notes (Signed)
 Patient sent for MRI via bed in stable condition with RN Alfonso.

## 2024-03-19 NOTE — Assessment & Plan Note (Signed)
 Toprol  and losartan 

## 2024-03-19 NOTE — Assessment & Plan Note (Addendum)
 With paresthesias right face and leg.  MRI brain negative, will treat like TIA with aspirin  lifelong, plavix  (total 21 days).  Patient declined statin secondary to muscle aches.  Will prescribe zetia .  LDL 118.

## 2024-03-19 NOTE — Progress Notes (Signed)
 SLP Cancellation Note  Patient Details Name: Thomas Calhoun MRN: [id number] DOB: [birth year]   Cancelled treatment:       Reason Eval/Treat Not Completed: SLP screened, no needs identified, will sign off (chart reviewed; consulted NSG then met w/ pt in room.)   Pt is a 67 yo male who was admitted earlier today with some transient paresthesias of his right face, ear, left hand and foot lasting about an hour and then resolving yesterday morning. He had recurrent episode this morning and came to the hospital.. NIHSS = 0 per Neurology note.  Pt denied any difficulty swallowing and is currently on a regular diet; tolerates swallowing pills w/ water per NSG. Pt conversed in conversation w/out expressive/receptive deficits noted; pt denied any speech-language deficits. Speech clear. No further skilled ST services indicated as pt appears at his baseline. Pt agreed. NSG to reconsult if any change in status while admitted.      Thomas Portugal, MS, CCC-SLP Speech Language Pathologist Rehab Services; [facility] [phone] (ascom) [name] 03/19/2024, 10:19 AM

## 2024-03-21 ENCOUNTER — Ambulatory Visit: Payer: Self-pay | Admitting: *Deleted

## 2024-03-21 NOTE — Telephone Encounter (Signed)
 I called [facility] to follow up on the message I sent over while they were closed for lunch regarding whether he should take another BP pill since he thinks he vomited his up this morning since his BP is so elevated.    I spoke with [place] who is getting it to the clinical team.    Pt needing advice on what to do since BP is elevated.

## 2024-03-21 NOTE — Telephone Encounter (Signed)
 Routing to provider to advise.

## 2024-03-21 NOTE — Telephone Encounter (Signed)
 Called and spoke to patient. He states he is currently at [facility] ER being evaluated. States he is waiting to see a provider right now.

## 2024-03-21 NOTE — Telephone Encounter (Signed)
 Copied from CRM [id number]. Topic: Clinical - Red Word Triage >> Mar 21, 2024 12:19 PM Santiya F wrote: Red Word that prompted transfer to Nurse Triage: Patient's blood pressure is 201/110. Patient says he took his meds for his blood pressure and fifteen minutes later threw up the medications Reason for Disposition  [1] Systolic BP >= 180 OR Diastolic >= 110 AND [2] missed most recent dose of blood pressure medication    Pt took BP medication then vomited 15 min. Later.  His BP is 201/110 and 185/107.   He thinks he vomited up his BP medication.   Should he take it again?  Answer Assessment - Initial Assessment Questions 1. BLOOD PRESSURE: What is your blood pressure? Did you take at least two measurements 5 minutes apart?     201/110 right now.    185/107 is the other reading.  I checked it in both arms.    It's been high for over 30 min.   No headaches or dizziness.   My foot is feeling tingly.   Been going on for over an hour in the right foot.     2. ONSET: When did you take your blood pressure?     This morning I took my BP medication and I vomited it up 15 min. Later.  Right as I finished eating I vomited.   I took the medicine right before I vomited.    Should I take it again?    I didn't see it in the vomit but it's a small pill and my BP is really high.   I'm wondering if I vomited it up.   3. HOW: How did you take your blood pressure? (e.g., automatic home BP monitor, visiting nurse)     I just got a new BP monitor yesterday.   Home monitor. I got out of the hospital day before yesterday.   I was in because my BP was so high.   They did all kinds of tests and started me on a new blood thinner.   I just took that a hour ago.    4. HISTORY: Do you have a history of high blood pressure?     Yes 5. MEDICINES: Are you taking any medicines for blood pressure? Have you missed any doses recently?     I think I vomited it up this morning. 6. OTHER SYMPTOMS: Do you have any symptoms?  (e.g., blurred vision, chest pain, difficulty breathing, headache, weakness)     Just my right foot is feeling tingly for over an hour now.    I had this when I went into the hospital.   They got my BP down while in the hospital.   It's been fine until today.   I'm thinking I vomited my BP medicine up and that's why my BP is so high. 7. PREGNANCY: Is there any chance you are pregnant? When was your last menstrual period?     N/A  Protocols used: Blood Pressure - High-A-AH FYI Only or Action Required?: Action required by provider: clinical question for provider.  Patient was last seen in primary care on 10/23/2023 by Valerio Melanie DASEN, NP.  Called Nurse Triage reporting Hypertension.BP 201/110 and 185/107.   Pt vomited 15 min. After taking his BP medication this morning.   Wanting to know if he should take it again?  BP been fine until today so thinking he vomited it up.   Recently in hospital for hypertension.  Seeking advice.  Symptoms  began today. No symptoms.  Denies headache, dizziness, shortness of breath.  Instructed to go to the ED if he had any of these symptoms.   Pt was agreeable.  Interventions attempted: Nothing. Monitoring his BP  Symptoms are: rapidly worsening.BP very elevated today.   Vomited up   Triage Disposition: Urgent Home Treatment With Follow-up Call  Pt advice on whether he should take another BP pill since he vomited this morning after taking his medication and now his BP is very elevated.   Pt agreeable to someone calling him back.  Patient/caregiver understands and will follow disposition?:  Yes agreeable to someone calling him back with further instructions.

## 2024-03-29 NOTE — Patient Instructions (Signed)

## 2024-04-01 ENCOUNTER — Encounter: Payer: Self-pay | Admitting: Nurse Practitioner

## 2024-04-01 ENCOUNTER — Ambulatory Visit (INDEPENDENT_AMBULATORY_CARE_PROVIDER_SITE_OTHER): Admitting: Nurse Practitioner

## 2024-04-01 VITALS — BP 122/74 | HR 60 | Temp 98.5°F | Ht 69.0 in | Wt 272.4 lb

## 2024-04-01 DIAGNOSIS — I5042 Chronic combined systolic (congestive) and diastolic (congestive) heart failure: Secondary | ICD-10-CM | POA: Diagnosis not present

## 2024-04-01 DIAGNOSIS — G459 Transient cerebral ischemic attack, unspecified: Secondary | ICD-10-CM

## 2024-04-01 DIAGNOSIS — E669 Obesity, unspecified: Secondary | ICD-10-CM

## 2024-04-01 DIAGNOSIS — I110 Hypertensive heart disease with heart failure: Secondary | ICD-10-CM

## 2024-04-01 DIAGNOSIS — E782 Mixed hyperlipidemia: Secondary | ICD-10-CM

## 2024-04-01 DIAGNOSIS — J4550 Severe persistent asthma, uncomplicated: Secondary | ICD-10-CM

## 2024-04-01 DIAGNOSIS — D696 Thrombocytopenia, unspecified: Secondary | ICD-10-CM | POA: Diagnosis not present

## 2024-04-01 DIAGNOSIS — Z95810 Presence of automatic (implantable) cardiac defibrillator: Secondary | ICD-10-CM

## 2024-04-01 DIAGNOSIS — J709 Respiratory conditions due to unspecified external agent: Secondary | ICD-10-CM | POA: Diagnosis not present

## 2024-04-01 DIAGNOSIS — I10 Essential (primary) hypertension: Secondary | ICD-10-CM

## 2024-04-01 DIAGNOSIS — G4733 Obstructive sleep apnea (adult) (pediatric): Secondary | ICD-10-CM

## 2024-04-01 NOTE — Assessment & Plan Note (Signed)
 Chronic, ongoing.  Continue current medication regimen and collaboration with pulmonary.  Recent note reviewed.

## 2024-04-01 NOTE — Assessment & Plan Note (Signed)
 Chronic, stable.  BP at goal in office.  Continue current medication regimen and collaboration with cardiology.  Recommend he monitor BP at least a few mornings a week at home and document.  DASH diet at home.  Notes reviewed from cardiology.  LAB: next visit.

## 2024-04-01 NOTE — Assessment & Plan Note (Signed)
?  TIA vs reaction to new medication.  Currently no further symptoms and BP has returned to baseline.  He is scheduled to see neurology upcoming.

## 2024-04-01 NOTE — Progress Notes (Signed)
 BP 122/74   Pulse 60   Temp 98.5 F (36.9 C) (Oral)   Ht 5' 9 (1.753 m)   Wt 272 lb 6.4 oz (123.6 kg)   SpO2 92%   BMI 40.23 kg/m    Subjective:    Patient ID: Thomas Calhoun, male    DOB: 1956-03-30, 68 y.o.   MRN: [id number]  HPI: Thomas Calhoun is a 68 y.o. male  Chief Complaint  Patient presents with   Hospitalization Follow-up   Transition of Care Hospital Follow up.  Was admitted to hospital on 03/18/24 after having pins and needles to right side of face, ear, and foot + elevation in BP.  His ENT has taken him off the Fasenra, as had this injection only a few days before this event happened with tingling and numbness + increased BP.  The question whether side effect from this and plan to try Dupixent.  Overall imaging in hospital was reassuring with no acute findings.  He is scheduled to see neurology at the end of October. Overall he reports feeling better.  Has been back at work last week and this week, overall feeling better.  68 y.o. year old male with past medical history of asthma, hypertension, hyperlipidemia, OSA, CKD stage IIIa, chronic thrombocytopenia, nonischemic cardiomyopathy, chronic systolic and diastolic and HFrEF, VT s/p AICD, GERD.  He presents to [facility] ED with reports of paresthesias described as a pins and needle sensation of his right face, ear, foot that lasted for about an hour before resolving yesterday morning. He felt normal when he went to bed. Around 830 this morning had recurrent episode of paresthesias over his right side including his face and leg.  He presented to his PCP for evaluation and was directed to come to ED for concerns of CVA.  He denies headache, vision change, aphasia, dizziness, weakness.  Patient is concerned that symptoms may be caused by a new medication he is taking Fasenra or artificial sweetener and propel water.    ED Course: On arrival to [facility] regional ED patient was noted to be afebrile temp 37 C, BP  170/96, HR 58, RR 20, SpO2 94% on room air.  CT head without obtained and shows no acute intracranial abnormality.  Labs notable for platelets 103, BUN 26, ALT 46, T. bili 1.8.  Lipid panel with total cholesterol 211 and LDL 118. TRH contacted for admission.   CT angio head and neck no blockage Echo showed ef 55-60% and agitated saline study negative MRI brain negative for stroke No arrythmias on telemetry monitoring   Will treat like TIA with aspirin  lifelong, plavix  (for total of 21 days), patient declined statin with allergy muscle aches and will prescribe zetia .  Hospital/Facility: [facility] D/C Physician: Dr. Josette D/C Date: 03/19/24  Records Requested: 04/01/24 Records Received: 04/01/24 Records Reviewed: 04/01/24  Diagnoses on Discharge: TIA (transient ischemic attack)   Date of interactive Contact within 48 hours of discharge:  Contact was through: phone  Date of 7 day or 14 day face-to-face visit:    within 14 days  Outpatient Encounter Medications as of 04/01/2024  Medication Sig   albuterol  (PROVENTIL  HFA;VENTOLIN  HFA) 108 (90 BASE) MCG/ACT inhaler Inhale 2 puffs into the lungs every 6 (six) hours as needed for wheezing or shortness of breath.   albuterol  (PROVENTIL ) (2.5 MG/3ML) 0.083% nebulizer solution Take 2.5 mg by nebulization every 6 (six) hours as needed for wheezing or shortness of breath.    aspirin  EC 81 MG tablet  Take 1 tablet (81 mg total) by mouth daily. Swallow whole.   azelastine  (ASTELIN ) 0.1 % nasal spray Place 2 sprays into both nostrils 2 (two) times daily. Use in each nostril as directed   clopidogrel  (PLAVIX ) 75 MG tablet Take 1 tablet (75 mg total) by mouth daily.   ezetimibe  (ZETIA ) 10 MG tablet Take 1 tablet (10 mg total) by mouth daily.   KRILL OIL PO Take 1 capsule by mouth daily.   loratadine  (CLARITIN ) 10 MG tablet Take 10 mg by mouth daily.   losartan  (COZAAR ) 25 MG tablet Take 50 mg by mouth daily.   magnesium  oxide (MAG-OX) 400 MG tablet Take 400  mg by mouth every 12 (twelve) hours.   metoprolol  succinate (TOPROL -XL) 25 MG 24 hr tablet Take 25 mg by mouth daily.   montelukast  (SINGULAIR ) 10 MG tablet Take 10 mg by mouth at bedtime.    potassium chloride  (KLOR-CON ) 10 MEQ tablet Take 10 mEq by mouth daily.   sotalol  (BETAPACE ) 120 MG tablet Take 120 mg by mouth 2 (two) times daily.   TRELEGY ELLIPTA 200-62.5-25 MCG/ACT AEPB Inhale 1 puff into the lungs daily.   vitamin C  (ASCORBIC ACID ) 500 MG tablet Take 500 mg by mouth daily.   vitamin E  400 UNIT capsule Take 400 Units by mouth daily.   Zinc Gluconate 30 MG TABS Take 30 mg by mouth daily.   benralizumab (FASENRA PEN) 30 MG/ML prefilled autoinjector Inject 30 mg into the skin every 30 (thirty) days. (Patient not taking: Reported on 04/01/2024)   No facility-administered encounter medications on file as of 04/01/2024.    Diagnostic Tests Reviewed/Disposition:     Latest Ref Rng & Units 03/19/2024    4:08 AM 03/18/2024   11:09 AM 10/23/2023    3:24 PM  CBC  WBC 4.0 - 10.5 K/uL 5.6  5.6  7.3   Hemoglobin 13.0 - 17.0 g/dL 84.6  83.3  82.6   Hematocrit 39.0 - 52.0 % 43.1  47.5  49.7   Platelets 150 - 400 K/uL 91  103  92        Latest Ref Rng & Units 03/19/2024    4:08 AM 03/18/2024   11:09 AM 10/23/2023    3:24 PM  CMP  Glucose 70 - 99 mg/dL 96  890  96   BUN 8 - 23 mg/dL 17  26  20    Creatinine 0.61 - 1.24 mg/dL 9.29  9.25  9.01   Sodium 135 - 145 mmol/L 139  138  142   Potassium 3.5 - 5.1 mmol/L 3.6  4.1  4.4   Chloride 98 - 111 mmol/L 105  101  103   CO2 22 - 32 mmol/L 27  30  22    Calcium  8.9 - 10.3 mg/dL 8.9  9.4  9.6   Total Protein 6.5 - 8.1 g/dL  7.5  7.0   Total Bilirubin 0.0 - 1.2 mg/dL  1.8  0.9   Alkaline Phos 38 - 126 U/L  48  61   AST 15 - 41 U/L  38  31   ALT 0 - 44 U/L  46  34     Consults: Neurology  Discharge Instructions:  Follow up PCP 5 days Refer to neurology  Disease/illness Education: Reviewed at length with patient  Home Health/Community Services  Discussions/Referrals: None  Establishment or re-establishment of referral orders for community resources: None  Discussion with other health care providers: Reviewed all recent notes  Assessment and Support of treatment  regimen adherence: Reviewed at length with patient  Appointments Coordinated with: Reviewed at length with patient  Education for self-management, independent living, and ADLs:  Reviewed at length with patient  Relevant past medical, surgical, family and social history reviewed and updated as indicated. Interim medical history since our last visit reviewed. Allergies and medications reviewed and updated.  Review of Systems  Constitutional:  Negative for activity change, diaphoresis, fatigue and fever.  Respiratory:  Negative for cough, chest tightness, shortness of breath and wheezing.   Cardiovascular:  Negative for chest pain, palpitations and leg swelling.  Gastrointestinal: Negative.   Endocrine: Negative for polydipsia, polyphagia and polyuria.  Neurological: Negative.   Psychiatric/Behavioral: Negative.      Per HPI unless specifically indicated above     Objective:    BP 122/74   Pulse 60   Temp 98.5 F (36.9 C) (Oral)   Ht 5' 9 (1.753 m)   Wt 272 lb 6.4 oz (123.6 kg)   SpO2 92%   BMI 40.23 kg/m   Wt Readings from Last 3 Encounters:  04/01/24 272 lb 6.4 oz (123.6 kg)  03/18/24 270 lb (122.5 kg)  12/18/23 260 lb (117.9 kg)    Physical Exam Vitals and nursing note reviewed.  Constitutional:      General: He is awake. He is not in acute distress.    Appearance: He is well-developed and well-groomed. He is obese. He is not ill-appearing or toxic-appearing.  HENT:     Head: Normocephalic.     Right Ear: Hearing and external ear normal.     Left Ear: Hearing and external ear normal.  Eyes:     General: Lids are normal.     Extraocular Movements: Extraocular movements intact.     Conjunctiva/sclera: Conjunctivae normal.  Neck:     Thyroid :  No thyromegaly.     Vascular: No carotid bruit.  Cardiovascular:     Rate and Rhythm: Normal rate and regular rhythm.     Heart sounds: Normal heart sounds. No murmur heard.    No gallop.  Pulmonary:     Effort: No accessory muscle usage or respiratory distress.     Breath sounds: Normal breath sounds.  Abdominal:     General: Bowel sounds are normal. There is no distension.     Palpations: Abdomen is soft.     Tenderness: There is no abdominal tenderness.  Musculoskeletal:     Cervical back: Full passive range of motion without pain.     Right lower leg: No edema.     Left lower leg: No edema.  Lymphadenopathy:     Cervical: No cervical adenopathy.  Skin:    General: Skin is warm.     Capillary Refill: Capillary refill takes less than 2 seconds.     Findings: Bruising present.     Comments: To both antecubital, large bruises from blood draws that are healing.  Neurological:     Mental Status: He is alert and oriented to person, place, and time.     Deep Tendon Reflexes: Reflexes are normal and symmetric.     Reflex Scores:      Brachioradialis reflexes are 2+ on the right side and 2+ on the left side.      Patellar reflexes are 2+ on the right side and 2+ on the left side. Psychiatric:        Attention and Perception: Attention normal.        Mood and Affect: Mood normal.  Speech: Speech normal.        Behavior: Behavior normal. Behavior is cooperative.        Thought Content: Thought content normal.     Results for orders placed or performed during the hospital encounter of 03/18/24  CBC   Collection Time: 03/18/24 11:09 AM  Result Value Ref Range   WBC 5.6 4.0 - 10.5 K/uL   RBC 4.76 4.22 - 5.81 MIL/uL   Hemoglobin 16.6 13.0 - 17.0 g/dL   HCT 52.4 60.9 - 47.9 %   MCV 99.8 80.0 - 100.0 fL   MCH 34.9 (H) 26.0 - 34.0 pg   MCHC 34.9 30.0 - 36.0 g/dL   RDW 88.0 88.4 - 84.4 %   Platelets 103 (L) 150 - 400 K/uL   nRBC 0.0 0.0 - 0.2 %  Differential   Collection  Time: 03/18/24 11:09 AM  Result Value Ref Range   Neutrophils Relative % 65 %   Neutro Abs 3.7 1.7 - 7.7 K/uL   Lymphocytes Relative 21 %   Lymphs Abs 1.2 0.7 - 4.0 K/uL   Monocytes Relative 13 %   Monocytes Absolute 0.7 0.1 - 1.0 K/uL   Eosinophils Relative 0 %   Eosinophils Absolute 0.0 0.0 - 0.5 K/uL   Basophils Relative 0 %   Basophils Absolute 0.0 0.0 - 0.1 K/uL   Immature Granulocytes 1 %   Abs Immature Granulocytes 0.04 0.00 - 0.07 K/uL  Comprehensive metabolic panel   Collection Time: 03/18/24 11:09 AM  Result Value Ref Range   Sodium 138 135 - 145 mmol/L   Potassium 4.1 3.5 - 5.1 mmol/L   Chloride 101 98 - 111 mmol/L   CO2 30 22 - 32 mmol/L   Glucose, Bld 109 (H) 70 - 99 mg/dL   BUN 26 (H) 8 - 23 mg/dL   Creatinine, Ser 9.25 0.61 - 1.24 mg/dL   Calcium  9.4 8.9 - 10.3 mg/dL   Total Protein 7.5 6.5 - 8.1 g/dL   Albumin 4.4 3.5 - 5.0 g/dL   AST 38 15 - 41 U/L   ALT 46 (H) 0 - 44 U/L   Alkaline Phosphatase 48 38 - 126 U/L   Total Bilirubin 1.8 (H) 0.0 - 1.2 mg/dL   GFR, Estimated >39 >39 mL/min   Anion gap 7 5 - 15  Lipid panel   Collection Time: 03/18/24 11:09 AM  Result Value Ref Range   Cholesterol 211 (H) 0 - 200 mg/dL   Triglycerides 859 <849 mg/dL   HDL 65 >59 mg/dL   Total CHOL/HDL Ratio 3.2 RATIO   VLDL 28 0 - 40 mg/dL   LDL Cholesterol 881 (H) 0 - 99 mg/dL  Hemoglobin J8r   Collection Time: 03/18/24 11:09 AM  Result Value Ref Range   Hgb A1c MFr Bld 5.4 4.8 - 5.6 %   Mean Plasma Glucose 108 mg/dL  Glucose, capillary   Collection Time: 03/18/24  9:45 PM  Result Value Ref Range   Glucose-Capillary 126 (H) 70 - 99 mg/dL  HIV Antibody (routine testing w rflx)   Collection Time: 03/19/24  4:08 AM  Result Value Ref Range   HIV Screen 4th Generation wRfx Non Reactive Non Reactive  CBC   Collection Time: 03/19/24  4:08 AM  Result Value Ref Range   WBC 5.6 4.0 - 10.5 K/uL   RBC 4.35 4.22 - 5.81 MIL/uL   Hemoglobin 15.3 13.0 - 17.0 g/dL   HCT 56.8 60.9  - 47.9 %   MCV  99.1 80.0 - 100.0 fL   MCH 35.2 (H) 26.0 - 34.0 pg   MCHC 35.5 30.0 - 36.0 g/dL   RDW 88.1 88.4 - 84.4 %   Platelets 91 (L) 150 - 400 K/uL   nRBC 0.0 0.0 - 0.2 %  Basic metabolic panel   Collection Time: 03/19/24  4:08 AM  Result Value Ref Range   Sodium 139 135 - 145 mmol/L   Potassium 3.6 3.5 - 5.1 mmol/L   Chloride 105 98 - 111 mmol/L   CO2 27 22 - 32 mmol/L   Glucose, Bld 96 70 - 99 mg/dL   BUN 17 8 - 23 mg/dL   Creatinine, Ser 9.29 0.61 - 1.24 mg/dL   Calcium  8.9 8.9 - 10.3 mg/dL   GFR, Estimated >39 >39 mL/min   Anion gap 7 5 - 15  Magnesium    Collection Time: 03/19/24  4:08 AM  Result Value Ref Range   Magnesium  2.1 1.7 - 2.4 mg/dL  ECHOCARDIOGRAM COMPLETE   Collection Time: 03/19/24  7:57 AM  Result Value Ref Range   Weight 4,320 oz   Height 70 in   BP 107/70 mmHg   Ao pk vel 1.20 m/s   AV Area VTI 3.90 cm2   AR max vel 3.79 cm2   AV Mean grad 3.0 mmHg   AV Peak grad 5.7 mmHg   S' Lateral 3.50 cm   AV Area mean vel 3.68 cm2   Area-P 1/2 2.40 cm2   MV VTI 2.67 cm2   Est EF 55 - 60%       Assessment & Plan:   Problem List Items Addressed This Visit       Cardiovascular and Mediastinum   TIA (transient ischemic attack)   ?TIA vs reaction to new medication.  Currently no further symptoms and BP has returned to baseline.  He is scheduled to see neurology upcoming.      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  BP at goal in office.  Continue current medication regimen and collaboration with cardiology.  Recommend he monitor BP at least a few mornings a week at home and document.  DASH diet at home.  Notes reviewed from cardiology.  LAB: next visit.      Chronic combined systolic and diastolic CHF (congestive heart failure) (HCC) - Primary   Chronic, stable. Euvolemic today.  Reviewed recent cardiology note.  Continue current medication regimen and collaboration with cardiology.  Recommend: - Reminded to  call for an overnight weight gain of >2 pounds or a weekly weight weight of >5 pounds - not adding salt to his food and has been reading food labels. Reviewed the importance of keeping daily sodium intake to 2000mg  daily  - Avoid Ibuprofen products        Respiratory   Severe asthma   Chronic, ongoing.  Continue current medication regimen and collaboration with pulmonary.  Recent note reviewed.      Extrinsic allergic respiratory disease (HCC)   Chronic, ongoing,  Followed by pulmonary and ENT.  Continue this collaboration and current medication regimen as prescribed by pulmonary.  ENT stopped newest treatment due to concern for recent side effects.        Hematopoietic and Hemostatic   Thrombocytopenia (HCC)   Ongoing, stable.  Noted in past on labs, recheck CBC next visit.        Other   Obesity (BMI 30-39.9)   BMI 40.23, lost significant weight over past  few years = total of 150 lbs, but has gained some of this back over past months.  Continue keto diet and collaboration with Dr. Teena. He is maintaining exercise regimen and diet focus.        Follow up plan: Return for as scheduled on September 15th.

## 2024-04-01 NOTE — Assessment & Plan Note (Signed)
Chronic, stable. Euvolemic today.  Reviewed recent cardiology note.  Continue current medication regimen and collaboration with cardiology.  Recommend: - Reminded to call for an overnight weight gain of >2 pounds or a weekly weight weight of >5 pounds - not adding salt to his food and has been reading food labels. Reviewed the importance of keeping daily sodium intake to 2000mg  daily  - Avoid Ibuprofen products

## 2024-04-01 NOTE — Assessment & Plan Note (Signed)
 BMI 40.23, lost significant weight over past few years = total of 150 lbs, but has gained some of this back over past months.  Continue keto diet and collaboration with Dr. Teena. He is maintaining exercise regimen and diet focus.

## 2024-04-01 NOTE — Assessment & Plan Note (Signed)
 Ongoing, stable.  Noted in past on labs, recheck CBC next visit.

## 2024-04-01 NOTE — Assessment & Plan Note (Signed)
 Chronic, ongoing,  Followed by pulmonary and ENT.  Continue this collaboration and current medication regimen as prescribed by pulmonary.  ENT stopped newest treatment due to concern for recent side effects.

## 2024-04-26 NOTE — Patient Instructions (Signed)
 Be Involved in Caring For Your Health:  Taking Medications When medications are taken as directed, they can greatly improve your health. But if they are not taken as prescribed, they may not work. In some cases, not taking them correctly can be harmful. To help ensure your treatment remains effective and safe, understand your medications and how to take them. Bring your medications to each visit for review by your provider.  Your lab results, notes, and after visit summary will be available on My Chart. We strongly encourage you to use this feature. If lab results are abnormal the clinic will contact you with the appropriate steps. If the clinic does not contact you assume the results are satisfactory. You can always view your results on My Chart. If you have questions regarding your health or results, please contact the clinic during office hours. You can also ask questions on My Chart.  We at [facility] are grateful that you chose [place] to provide your care. We strive to provide evidence-based and compassionate care and are always looking for feedback. If you get a survey from the clinic please complete this so we can hear your opinions.  Heart-Healthy Eating Plan Many factors influence your heart health, including eating and exercise habits. Heart health is also called coronary health. Coronary risk increases with abnormal blood fat (lipid) levels. A heart-healthy eating plan includes limiting unhealthy fats, increasing healthy fats, limiting salt (sodium) intake, and making other diet and lifestyle changes. What is my plan? Your health care provider may recommend that: You limit your fat intake to _________% or less of your total calories each day. You limit your saturated fat intake to _________% or less of your total calories each day. You limit the amount of cholesterol in your diet to less than _________ mg per day. You limit the amount of sodium in your diet to less than _________  mg per day. What are tips for following this plan? Cooking Cook foods using methods other than frying. Baking, boiling, grilling, and broiling are all good options. Other ways to reduce fat include: Removing the skin from poultry. Removing all visible fats from meats. Steaming vegetables in water or broth. Meal planning  At meals, imagine dividing your plate into fourths: Fill one-half of your plate with vegetables and green salads. Fill one-fourth of your plate with whole grains. Fill one-fourth of your plate with lean protein foods. Eat 2-4 cups of vegetables per day. One cup of vegetables equals 1 cup (91 g) broccoli or cauliflower florets, 2 medium carrots, 1 large bell pepper, 1 large sweet potato, 1 large tomato, 1 medium white potato, 2 cups (150 g) raw leafy greens. Eat 1-2 cups of fruit per day. One cup of fruit equals 1 small apple, 1 large banana, 1 cup (237 g) mixed fruit, 1 large orange,  cup (82 g) dried fruit, 1 cup (240 mL) 100% fruit juice. Eat more foods that contain soluble fiber. Examples include apples, broccoli, carrots, beans, peas, and barley. Aim to get 25-30 g of fiber per day. Increase your consumption of legumes, nuts, and seeds to 4-5 servings per week. One serving of dried beans or legumes equals  cup (90 g) cooked, 1 serving of nuts is  oz (12 almonds, 24 pistachios, or 7 walnut halves), and 1 serving of seeds equals  oz (8 g). Fats Choose healthy fats more often. Choose monounsaturated and polyunsaturated fats, such as olive and canola oils, avocado oil, flaxseeds, walnuts, almonds, and seeds. Eat  more omega-3 fats. Choose salmon, mackerel, sardines, tuna, flaxseed oil, and ground flaxseeds. Aim to eat fish at least 2 times each week. Check food labels carefully to identify foods with trans fats or high amounts of saturated fat. Limit saturated fats. These are found in animal products, such as meats, butter, and cream. Plant sources of saturated fats  include palm oil, palm kernel oil, and coconut oil. Avoid foods with partially hydrogenated oils in them. These contain trans fats. Examples are stick margarine, some tub margarines, cookies, crackers, and other baked goods. Avoid fried foods. General information Eat more home-cooked food and less restaurant, buffet, and fast food. Limit or avoid alcohol. Limit foods that are high in added sugar and simple starches such as foods made using white refined flour (white breads, pastries, sweets). Lose weight if you are overweight. Losing just 5-10% of your body weight can help your overall health and prevent diseases such as diabetes and heart disease. Monitor your sodium intake, especially if you have high blood pressure. Talk with your health care provider about your sodium intake. Try to incorporate more vegetarian meals weekly. What foods should I eat? Fruits All fresh, canned (in natural juice), or frozen fruits. Vegetables Fresh or frozen vegetables (raw, steamed, roasted, or grilled). Green salads. Grains Most grains. Choose whole wheat and whole grains most of the time. Rice and pasta, including brown rice and pastas made with whole wheat. Meats and other proteins Lean, well-trimmed beef, veal, pork, and lamb. Chicken and [place] without skin. All fish and shellfish. Wild duck, rabbit, pheasant, and venison. Egg whites or low-cholesterol egg substitutes. Dried beans, peas, lentils, and tofu. Seeds and most nuts. Dairy Low-fat or nonfat cheeses, including ricotta and mozzarella. Skim or 1% milk (liquid, powdered, or evaporated). Buttermilk made with low-fat milk. Nonfat or low-fat yogurt. Fats and oils Non-hydrogenated (trans-free) margarines. Vegetable oils, including soybean, sesame, sunflower, olive, avocado, peanut, safflower, corn, canola, and cottonseed. Salad dressings or mayonnaise made with a vegetable oil. Beverages Water (mineral or sparkling). Coffee and tea. Unsweetened ice  tea. Diet beverages. Sweets and desserts Sherbet, gelatin, and fruit ice. Small amounts of dark chocolate. Limit all sweets and desserts. Seasonings and condiments All seasonings and condiments. The items listed above may not be a complete list of foods and beverages you can eat. Contact a dietitian for more options. What foods should I avoid? Fruits Canned fruit in heavy syrup. Fruit in cream or butter sauce. Fried fruit. Limit coconut. Vegetables Vegetables cooked in cheese, cream, or butter sauce. Fried vegetables. Grains Breads made with saturated or trans fats, oils, or whole milk. Croissants. Sweet rolls. Donuts. High-fat crackers, such as cheese crackers and chips. Meats and other proteins Fatty meats, such as hot dogs, ribs, sausage, bacon, rib-eye roast or steak. High-fat deli meats, such as salami and bologna. Caviar. Domestic duck and goose. Organ meats, such as liver. Dairy Cream, sour cream, cream cheese, and creamed cottage cheese. Whole-milk cheeses. Whole or 2% milk (liquid, evaporated, or condensed). Whole buttermilk. Cream sauce or high-fat cheese sauce. Whole-milk yogurt. Fats and oils Meat fat, or shortening. Cocoa butter, hydrogenated oils, palm oil, coconut oil, palm kernel oil. Solid fats and shortenings, including bacon fat, salt pork, lard, and butter. Nondairy cream substitutes. Salad dressings with cheese or sour cream. Beverages Regular sodas and any drinks with added sugar. Sweets and desserts Frosting. Pudding. Cookies. Cakes. Pies. Milk chocolate or white chocolate. Buttered syrups. Full-fat ice cream or ice cream drinks. The items listed above may  not be a complete list of foods and beverages to avoid. Contact a dietitian for more information. Summary Heart-healthy meal planning includes limiting unhealthy fats, increasing healthy fats, limiting salt (sodium) intake and making other diet and lifestyle changes. Lose weight if you are overweight. Losing just  5-10% of your body weight can help your overall health and prevent diseases such as diabetes and heart disease. Focus on eating a balance of foods, including fruits and vegetables, low-fat or nonfat dairy, lean protein, nuts and legumes, whole grains, and heart-healthy oils and fats. This information is not intended to replace advice given to you by your health care provider. Make sure you discuss any questions you have with your health care provider. Document Revised: 09/05/2021 Document Reviewed: 09/05/2021 Elsevier Patient Education  2024 [employer].

## 2024-04-28 ENCOUNTER — Ambulatory Visit (INDEPENDENT_AMBULATORY_CARE_PROVIDER_SITE_OTHER): Admitting: Nurse Practitioner

## 2024-04-28 ENCOUNTER — Encounter: Payer: Self-pay | Admitting: Nurse Practitioner

## 2024-04-28 VITALS — BP 118/72 | HR 60 | Temp 98.4°F | Resp 15 | Ht 69.02 in | Wt 274.2 lb

## 2024-04-28 DIAGNOSIS — J4550 Severe persistent asthma, uncomplicated: Secondary | ICD-10-CM | POA: Diagnosis not present

## 2024-04-28 DIAGNOSIS — Z23 Encounter for immunization: Secondary | ICD-10-CM

## 2024-04-28 DIAGNOSIS — G4733 Obstructive sleep apnea (adult) (pediatric): Secondary | ICD-10-CM

## 2024-04-28 DIAGNOSIS — I5042 Chronic combined systolic (congestive) and diastolic (congestive) heart failure: Secondary | ICD-10-CM | POA: Diagnosis not present

## 2024-04-28 NOTE — Progress Notes (Signed)
 BP 118/72 (BP Location: Left Arm, Patient Position: Sitting, Cuff Size: Large)   Pulse 60   Temp 98.4 F (36.9 C) (Oral)   Resp 15   Ht 5' 9.02 (1.753 m)   Wt 274 lb 3.2 oz (124.4 kg)   SpO2 95%   BMI 40.47 kg/m    Subjective:    Patient ID: Thomas Calhoun, male    DOB: [birth year], 68 y.o.   MRN: [id number]  HPI: Thomas Calhoun is a 68 y.o. male  Chief Complaint  Patient presents with   Hypertension   Asthma    Med he was using for this is what caused his high BP and other many side effects. Coughing more, wheezing more.    ASTHMA Continues to use Trelegy, Albuterol , Claritin , Singulair . Saw pulmonary last 02/04/24.  Followed by ENT (02/19/24) and allergist at [facility] (03/06/24). Recently had a reaction to medication started by allergist, caused elevations in BP Dann).   Taking Losartan  and Betapace . Asthma status: stable Satisfied with current treatment?: yes Albuterol /rescue inhaler frequency: none recently, reports should probably use more Dyspnea frequency: occasional at baseline Wheezing frequency: occasional at baseline Cough frequency: none Nocturnal symptom frequency: none Limitation of activity: no Current upper respiratory symptoms: no Triggers: pollen Home peak flows:none Last Spirometry: with pulmonary Failed/intolerant to following asthma meds: multiple Asthma meds in past: multiple Aerochamber/spacer use: no Visits to ER or Urgent Care in past year: yes Pneumovax: Not up to Date Influenza: Up to Date   Relevant past medical, surgical, family and social history reviewed and updated as indicated. Interim medical history since our last visit reviewed. Allergies and medications reviewed and updated.  Review of Systems  Constitutional:  Negative for activity change, diaphoresis, fatigue and fever.  Respiratory:  Negative for cough, chest tightness, shortness of breath and wheezing.   Cardiovascular:  Negative for chest pain, palpitations and leg  swelling.  Gastrointestinal: Negative.   Endocrine: Negative for polydipsia, polyphagia and polyuria.  Neurological: Negative.   Psychiatric/Behavioral: Negative.      Per HPI unless specifically indicated above     Objective:    BP 118/72 (BP Location: Left Arm, Patient Position: Sitting, Cuff Size: Large)   Pulse 60   Temp 98.4 F (36.9 C) (Oral)   Resp 15   Ht 5' 9.02 (1.753 m)   Wt 274 lb 3.2 oz (124.4 kg)   SpO2 95%   BMI 40.47 kg/m   Wt Readings from Last 3 Encounters:  04/28/24 274 lb 3.2 oz (124.4 kg)  04/01/24 272 lb 6.4 oz (123.6 kg)  03/18/24 270 lb (122.5 kg)    Physical Exam Vitals and nursing note reviewed.  Constitutional:      General: He is awake. He is not in acute distress.    Appearance: He is well-developed and well-groomed. He is obese. He is not ill-appearing or toxic-appearing.  HENT:     Head: Normocephalic.     Right Ear: Hearing and external ear normal.     Left Ear: Hearing and external ear normal.  Eyes:     General: Lids are normal.     Extraocular Movements: Extraocular movements intact.     Conjunctiva/sclera: Conjunctivae normal.  Neck:     Thyroid : No thyromegaly.     Vascular: No carotid bruit.  Cardiovascular:     Rate and Rhythm: Normal rate and regular rhythm.     Heart sounds: Normal heart sounds. No murmur heard.    No gallop.  Pulmonary:  Effort: No accessory muscle usage or respiratory distress.     Breath sounds: Normal breath sounds.  Abdominal:     General: Bowel sounds are normal. There is no distension.     Palpations: Abdomen is soft.     Tenderness: There is no abdominal tenderness.  Musculoskeletal:     Cervical back: Full passive range of motion without pain.     Right lower leg: No edema.     Left lower leg: No edema.  Lymphadenopathy:     Cervical: No cervical adenopathy.  Skin:    General: Skin is warm.     Capillary Refill: Capillary refill takes less than 2 seconds.  Neurological:     Mental  Status: He is alert and oriented to person, place, and time.     Deep Tendon Reflexes: Reflexes are normal and symmetric.     Reflex Scores:      Brachioradialis reflexes are 2+ on the right side and 2+ on the left side.      Patellar reflexes are 2+ on the right side and 2+ on the left side. Psychiatric:        Attention and Perception: Attention normal.        Mood and Affect: Mood normal.        Speech: Speech normal.        Behavior: Behavior normal. Behavior is cooperative.        Thought Content: Thought content normal.     Results for orders placed or performed during the hospital encounter of 03/18/24  CBC   Collection Time: 03/18/24 11:09 AM  Result Value Ref Range   WBC 5.6 4.0 - 10.5 K/uL   RBC 4.76 4.22 - 5.81 MIL/uL   Hemoglobin 16.6 13.0 - 17.0 g/dL   HCT 52.4 60.9 - 47.9 %   MCV 99.8 80.0 - 100.0 fL   MCH 34.9 (H) 26.0 - 34.0 pg   MCHC 34.9 30.0 - 36.0 g/dL   RDW 88.0 88.4 - 84.4 %   Platelets 103 (L) 150 - 400 K/uL   nRBC 0.0 0.0 - 0.2 %  Differential   Collection Time: 03/18/24 11:09 AM  Result Value Ref Range   Neutrophils Relative % 65 %   Neutro Abs 3.7 1.7 - 7.7 K/uL   Lymphocytes Relative 21 %   Lymphs Abs 1.2 0.7 - 4.0 K/uL   Monocytes Relative 13 %   Monocytes Absolute 0.7 0.1 - 1.0 K/uL   Eosinophils Relative 0 %   Eosinophils Absolute 0.0 0.0 - 0.5 K/uL   Basophils Relative 0 %   Basophils Absolute 0.0 0.0 - 0.1 K/uL   Immature Granulocytes 1 %   Abs Immature Granulocytes 0.04 0.00 - 0.07 K/uL  Comprehensive metabolic panel   Collection Time: 03/18/24 11:09 AM  Result Value Ref Range   Sodium 138 135 - 145 mmol/L   Potassium 4.1 3.5 - 5.1 mmol/L   Chloride 101 98 - 111 mmol/L   CO2 30 22 - 32 mmol/L   Glucose, Bld 109 (H) 70 - 99 mg/dL   BUN 26 (H) 8 - 23 mg/dL   Creatinine, Ser 9.25 0.61 - 1.24 mg/dL   Calcium  9.4 8.9 - 10.3 mg/dL   Total Protein 7.5 6.5 - 8.1 g/dL   Albumin 4.4 3.5 - 5.0 g/dL   AST 38 15 - 41 U/L   ALT 46 (H) 0 - 44  U/L   Alkaline Phosphatase 48 38 - 126 U/L   Total Bilirubin  1.8 (H) 0.0 - 1.2 mg/dL   GFR, Estimated >39 >39 mL/min   Anion gap 7 5 - 15  Lipid panel   Collection Time: 03/18/24 11:09 AM  Result Value Ref Range   Cholesterol 211 (H) 0 - 200 mg/dL   Triglycerides 859 <849 mg/dL   HDL 65 >59 mg/dL   Total CHOL/HDL Ratio 3.2 RATIO   VLDL 28 0 - 40 mg/dL   LDL Cholesterol 881 (H) 0 - 99 mg/dL  Hemoglobin J8r   Collection Time: 03/18/24 11:09 AM  Result Value Ref Range   Hgb A1c MFr Bld 5.4 4.8 - 5.6 %   Mean Plasma Glucose 108 mg/dL  Glucose, capillary   Collection Time: 03/18/24  9:45 PM  Result Value Ref Range   Glucose-Capillary 126 (H) 70 - 99 mg/dL  HIV Antibody (routine testing w rflx)   Collection Time: 03/19/24  4:08 AM  Result Value Ref Range   HIV Screen 4th Generation wRfx Non Reactive Non Reactive  CBC   Collection Time: 03/19/24  4:08 AM  Result Value Ref Range   WBC 5.6 4.0 - 10.5 K/uL   RBC 4.35 4.22 - 5.81 MIL/uL   Hemoglobin 15.3 13.0 - 17.0 g/dL   HCT 56.8 60.9 - 47.9 %   MCV 99.1 80.0 - 100.0 fL   MCH 35.2 (H) 26.0 - 34.0 pg   MCHC 35.5 30.0 - 36.0 g/dL   RDW 88.1 88.4 - 84.4 %   Platelets 91 (L) 150 - 400 K/uL   nRBC 0.0 0.0 - 0.2 %  Basic metabolic panel   Collection Time: 03/19/24  4:08 AM  Result Value Ref Range   Sodium 139 135 - 145 mmol/L   Potassium 3.6 3.5 - 5.1 mmol/L   Chloride 105 98 - 111 mmol/L   CO2 27 22 - 32 mmol/L   Glucose, Bld 96 70 - 99 mg/dL   BUN 17 8 - 23 mg/dL   Creatinine, Ser 9.29 0.61 - 1.24 mg/dL   Calcium  8.9 8.9 - 10.3 mg/dL   GFR, Estimated >39 >39 mL/min   Anion gap 7 5 - 15  Magnesium    Collection Time: 03/19/24  4:08 AM  Result Value Ref Range   Magnesium  2.1 1.7 - 2.4 mg/dL  ECHOCARDIOGRAM COMPLETE   Collection Time: 03/19/24  7:57 AM  Result Value Ref Range   Weight 4,320 oz   Height 70 in   BP 107/70 mmHg   Ao pk vel 1.20 m/s   AV Area VTI 3.90 cm2   AR max vel 3.79 cm2   AV Mean grad 3.0 mmHg    AV Peak grad 5.7 mmHg   S' Lateral 3.50 cm   AV Area mean vel 3.68 cm2   Area-P 1/2 2.40 cm2   MV VTI 2.67 cm2   Est EF 55 - 60%       Assessment & Plan:   Problem List Items Addressed This Visit       Cardiovascular and Mediastinum   Chronic combined systolic and diastolic CHF (congestive heart failure) (HCC) - Primary   Chronic, stable. Euvolemic today.  Reviewed recent cardiology note.  Continue current medication regimen and collaboration with cardiology.  Recommend: - Reminded to call for an overnight weight gain of >2 pounds or a weekly weight weight of >5 pounds - not adding salt to his food and has been reading food labels. Reviewed the importance of keeping daily sodium intake to 2000mg  daily  - Avoid Ibuprofen products  Respiratory   Severe asthma   Chronic, ongoing.  Continue current medication regimen and collaboration with pulmonary.  Recent note reviewed.      Obstructive sleep apnea   No longer uses CPAP -- has not used in >2 years since weight loss.  May need to restart in future.      Other Visit Diagnoses       Flu vaccine need       Flu vaccine today, educated on this.   Relevant Orders   Flu vaccine HIGH DOSE PF(Fluzone Trivalent) (Completed)        Follow up plan: Return in about 6 months (around 10/26/2024) for Annual Physical -- after 10/22/24.

## 2024-04-28 NOTE — Assessment & Plan Note (Signed)
Chronic, stable. Euvolemic today.  Reviewed recent cardiology note.  Continue current medication regimen and collaboration with cardiology.  Recommend: - Reminded to call for an overnight weight gain of >2 pounds or a weekly weight weight of >5 pounds - not adding salt to his food and has been reading food labels. Reviewed the importance of keeping daily sodium intake to 2000mg  daily  - Avoid Ibuprofen products

## 2024-04-28 NOTE — Assessment & Plan Note (Signed)
 Chronic, ongoing.  Continue current medication regimen and collaboration with pulmonary.  Recent note reviewed.

## 2024-04-28 NOTE — Assessment & Plan Note (Signed)
 No longer uses CPAP -- has not used in >2 years since weight loss.  May need to restart in future.

## 2024-05-06 ENCOUNTER — Encounter: Payer: Self-pay | Admitting: Nurse Practitioner

## 2024-05-06 MED ORDER — LOSARTAN POTASSIUM 25 MG PO TABS: 50.0000 mg | ORAL_TABLET | Freq: Every day | ORAL | 4 refills | Status: AC

## 2024-06-11 ENCOUNTER — Encounter: Payer: Self-pay | Admitting: Nurse Practitioner

## 2024-10-28 ENCOUNTER — Encounter: Admitting: Nurse Practitioner

## 2024-12-30 ENCOUNTER — Ambulatory Visit
